# Patient Record
Sex: Female | Born: 1955 | Race: Black or African American | Hispanic: No | Smoking: Never smoker
Health system: Southern US, Community
[De-identification: ages and names within clinical notes are randomized; demographics above are authoritative.]

## PROBLEM LIST (undated history)

## (undated) DIAGNOSIS — E119 Type 2 diabetes mellitus without complications: Secondary | ICD-10-CM

## (undated) DIAGNOSIS — F32A Depression, unspecified: Secondary | ICD-10-CM

## (undated) DIAGNOSIS — L03211 Cellulitis of face: Secondary | ICD-10-CM

## (undated) DIAGNOSIS — I10 Essential (primary) hypertension: Secondary | ICD-10-CM

## (undated) DIAGNOSIS — G8929 Other chronic pain: Secondary | ICD-10-CM

## (undated) DIAGNOSIS — E785 Hyperlipidemia, unspecified: Secondary | ICD-10-CM

## (undated) DIAGNOSIS — F329 Major depressive disorder, single episode, unspecified: Secondary | ICD-10-CM

## (undated) DIAGNOSIS — M545 Low back pain, unspecified: Secondary | ICD-10-CM

## (undated) HISTORY — PX: MYOMECTOMY: SHX85

## (undated) HISTORY — DX: Depression, unspecified: F32.A

## (undated) HISTORY — PX: INGUINAL HERNIA REPAIR: SUR1180

## (undated) HISTORY — PX: EYE SURGERY: SHX253

## (undated) HISTORY — PX: CATARACT EXTRACTION W/ INTRAOCULAR LENS  IMPLANT, BILATERAL: SHX1307

## (undated) HISTORY — PX: OTHER SURGICAL HISTORY: SHX169

## (undated) HISTORY — PX: ROBOTIC ASSISTED LAPAROSCOPIC VAGINAL HYSTERECTOMY WITH FIBROID REMOVAL: SHX5387

## (undated) HISTORY — DX: Major depressive disorder, single episode, unspecified: F32.9

## (undated) HISTORY — DX: Essential (primary) hypertension: I10

---

## 2016-11-21 ENCOUNTER — Ambulatory Visit: Payer: Medicare Other | Attending: Physical Medicine and Rehabilitation | Admitting: Physical Therapy

## 2016-11-21 ENCOUNTER — Encounter: Payer: Self-pay | Admitting: Physical Therapy

## 2016-11-21 DIAGNOSIS — M79604 Pain in right leg: Secondary | ICD-10-CM | POA: Diagnosis present

## 2016-11-21 DIAGNOSIS — M25551 Pain in right hip: Secondary | ICD-10-CM | POA: Diagnosis present

## 2016-11-21 DIAGNOSIS — M545 Low back pain: Secondary | ICD-10-CM | POA: Diagnosis present

## 2016-11-21 DIAGNOSIS — R262 Difficulty in walking, not elsewhere classified: Secondary | ICD-10-CM | POA: Insufficient documentation

## 2016-11-21 DIAGNOSIS — G8929 Other chronic pain: Secondary | ICD-10-CM

## 2016-11-21 NOTE — Therapy (Signed)
[facility] Outpatient Rehabilitation [facility] [address] [place], [place], [address] Phone: [phone]   Fax:  [phone]  Physical Therapy Evaluation  Patient Details  Name: Lauren Waller MRN: [id number] Date of Birth: 01-03-1956 Referring Provider: Romero Belling, MD (pt reports Lunette Stands, MD)  Encounter Date: 11/21/2016      PT End of Session - 11/21/16 1322    Visit Number 1   Number of Visits 9   Date for PT Re-Evaluation 12/23/16   Authorization Type UHC MCR- KX at visit 15   PT Start Time 1330   PT Stop Time 1415   PT Time Calculation (min) 45 min   Activity Tolerance Patient limited by pain   Behavior During Therapy [facility] for tasks assessed/performed      No past medical history on file.  Past Surgical History:  Procedure Laterality Date  . EYE SURGERY Bilateral    2016  . fatty tissue removal from upper back     1998  . HERNIA REPAIR    . ROBOTIC ASSISTED LAPAROSCOPIC VAGINAL HYSTERECTOMY WITH FIBROID REMOVAL     2009    There were no vitals filed for this visit.       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 Has been to PT in [place] which was helpful. Did not do many of the exercises after due to difficulty, tries to walk often. "I cannot do much because I will have a problem with my (R) knee"  Walks when the weather is nice. Back bothers her mostly in cool weather. Wants MD to give her somebody to clean her home due to pain/difficulty. Does not do much during the day, just tries to walk around   How long can you sit comfortably? unable to sit for full meal   How long can you walk comfortably? take a couple steps then rest.    Patient Stated Goals decrease pain, dance, dressing, "stick" pain when standing up.    Currently in Pain? Yes   Pain Score 5    Pain Location Back   Pain Orientation Right;Lower   Pain Radiating Towards R hip and leg.    Aggravating Factors  unknown   Pain Relieving Factors medications, "I can't do exercises"   stand and try to move around            [facility] PT Assessment - 11/21/16 0001      Assessment   Medical Diagnosis lumbar   Referring Provider Romero Belling, MD  pt reports Lunette Stands, MD   Hand Dominance Right   Next MD Visit unknown   Prior Therapy yes, not this year     Precautions   Precaution Comments vision impairment     Restrictions   Weight Bearing Restrictions No     Balance Screen   Has the patient fallen in the past 6 months No     Home Environment   Living Environment Private residence  apartment   Additional Comments elevator available at apartment building, lives on 2nd floor     Prior Function   Level of Independence Independent     Cognition   Overall Cognitive Status Within Functional Limits for tasks assessed     Observation/Other Assessments   Focus on Therapeutic Outcomes (FOTO)  33% ability  goal 49% ability  Sensation   Additional Comments radicular pain, lateral/ant thigh to toes     Posture/Postural Control   Posture Comments resting flexed posture     ROM / Strength   AROM / PROM / Strength AROM;Strength     AROM   Overall AROM Comments severe limitations in hips and lumbar spine due to pain     Strength   Overall Strength Comments unable to test at eval due to pain     Palpation   Palpation comment TTP R piriformis and superior hip, unable to tolerate light touch.                    [facility] Adult PT Treatment/Exercise - 11/21/16 0001      Therapeutic Activites    Therapeutic Activities Other Therapeutic Activities   Other Therapeutic Activities bed mobility     Exercises   Exercises Lumbar     Lumbar Exercises: Stretches   Single Knee to Chest Stretch Limitations L knee to chest with R bent; R knee to chest with L extended   Lower Trunk Rotation  Limitations 5x each side, cues for slow movmement                PT Education - 11/21/16 1322    Education provided Yes   Education Details anatomy of condition, POC, HEP, exercise form/rationale   Person(s) Educated Patient   Methods Explanation;Demonstration;Tactile cues;Verbal cues;Handout   Comprehension Verbalized understanding;Returned demonstration;Verbal cues required;Tactile cues required;Need further instruction          PT Short Term Goals - 11/21/16 2006      PT SHORT TERM GOAL #1   Title Pt will be able to sit through a full meal without needing to stand due to back pain by 3/2   Baseline unable to sit through a full meal at eval   Time 4   Period Weeks   Status New     PT SHORT TERM GOAL #2   Title Pt will demonstrate proper bed and chair mobility to decrease excessive strain on spine in sit<>stand and sit<>supine   Baseline began educating at eval   Time 4   Period Weeks   Status New     PT SHORT TERM GOAL #3   Title Pt will demonstrate proper squat form to grab objects from the floor   Baseline lacking knowledge at eval   Time 4   Period Weeks   Status New     PT SHORT TERM GOAL #4   Title Pt will be able to walk continuously for 5 minutes without requiring a seated rest break to improve functional ambulation mobility   Baseline able to take a few steps at eval   Time 4   Period Weeks   Status New                  Plan - 11/21/16 1958    Clinical Impression Statement Pt presents to PT with complaints of R low back, buttock and leg pain that has worsened since injury in 2002. Pt reports past [place] in [place] helped but she was unable to continue exercises due to pain. Pt did not tolerate touch to low back or hip, reaching back to pull PT hand away during palpation. Pt reported severe pain with knee to chest and was explained that stretching sensations are not comfortable but are okay. Pt with notable tightness in lumbar and hip musculature  resulting in limited mobility and pain. Pt will  benefit from skilled PT in order to decrease muscular tightness and decrease pain with functional mobility.    Rehab Potential Fair   PT Frequency 2x / week   PT Duration 4 weeks   PT Treatment/Interventions ADLs/Self Care Home Management;Cryotherapy;Electrical Stimulation;Iontophoresis 4mg /ml Dexamethasone;Functional mobility training;Stair training;Gait training;Ultrasound;Traction;Moist Heat;Therapeutic activities;Therapeutic exercise;Balance training;Neuromuscular re-education;Patient/family education;Passive range of motion;Manual techniques;Dry needling;Taping   PT Next Visit Plan nu step, hip stretches, abdominal engagement   PT Home Exercise Plan single knee to chest, lower trunk rotation   Consulted and Agree with Plan of Care Patient      Patient will benefit from skilled therapeutic intervention in order to improve the following deficits and impairments:  Decreased range of motion, Difficulty walking, Increased muscle spasms, Decreased activity tolerance, Pain, Improper body mechanics, Impaired flexibility, Decreased mobility, Postural dysfunction  Visit Diagnosis: Chronic right-sided low back pain, with sciatica presence unspecified - Plan: PT plan of care cert/re-cert  Pain in right hip - Plan: PT plan of care cert/re-cert  Pain in right leg - Plan: PT plan of care cert/re-cert  Difficulty in walking, not elsewhere classified - Plan: PT plan of care cert/re-cert      G-Codes - 2016-12-20 2010-03-30    Functional Assessment Tool Used FOTO 33% ability (goal 49% ability), clinical judgement   Functional Limitation Mobility: Walking and moving around   Mobility: Walking and Moving Around Current Status [phone]) At least 60 percent but less than 80 percent impaired, limited or restricted   Mobility: Walking and Moving Around Goal Status [phone]) At least 40 percent but less than 60 percent impaired, limited or restricted       Problem  List There are no active problems to display for this patient.   Kordell Jafri C. Gunnison Chahal PT, DPT Dec 20, 2016 8:16 PM   [facility] [address] [place], [place], [address] Phone: [phone]   Fax:  [phone]  Name: Kati Riggenbach MRN: [id number] Date of Birth: 1956/05/16

## 2016-11-24 DIAGNOSIS — E1165 Type 2 diabetes mellitus with hyperglycemia: Secondary | ICD-10-CM | POA: Diagnosis not present

## 2016-11-24 DIAGNOSIS — E785 Hyperlipidemia, unspecified: Secondary | ICD-10-CM | POA: Diagnosis not present

## 2016-11-24 DIAGNOSIS — I10 Essential (primary) hypertension: Secondary | ICD-10-CM | POA: Diagnosis not present

## 2016-11-24 DIAGNOSIS — Z0000 Encounter for general adult medical examination without abnormal findings: Secondary | ICD-10-CM | POA: Diagnosis not present

## 2016-11-28 ENCOUNTER — Ambulatory Visit: Payer: Medicare Other | Admitting: Physical Therapy

## 2016-11-29 ENCOUNTER — Ambulatory Visit: Payer: Self-pay | Admitting: Podiatry

## 2016-11-30 ENCOUNTER — Ambulatory Visit: Payer: Medicare Other | Admitting: Physical Therapy

## 2016-12-05 ENCOUNTER — Ambulatory Visit: Payer: Medicare Other | Attending: Physical Medicine and Rehabilitation | Admitting: Physical Therapy

## 2016-12-05 DIAGNOSIS — M25551 Pain in right hip: Secondary | ICD-10-CM

## 2016-12-05 DIAGNOSIS — M545 Low back pain: Secondary | ICD-10-CM | POA: Insufficient documentation

## 2016-12-05 DIAGNOSIS — M79604 Pain in right leg: Secondary | ICD-10-CM | POA: Insufficient documentation

## 2016-12-05 DIAGNOSIS — G8929 Other chronic pain: Secondary | ICD-10-CM | POA: Diagnosis not present

## 2016-12-05 DIAGNOSIS — R262 Difficulty in walking, not elsewhere classified: Secondary | ICD-10-CM | POA: Diagnosis not present

## 2016-12-05 NOTE — Patient Instructions (Addendum)
Abdominal Bracing With Pelvic Floor (Hook-Lying)    With neutral spine, tighten pelvic floor and abdominals. Repeat 5_ times. Do _1-2__ times a day. May do with legs on pillow.   Copyright  VHI. All rights reserved.  Hamstring Stretch    With other leg bent, foot flat, grasp right leg and slowly try to straighten knee. Hold __10-30__ seconds. Repeat __3_ times. Do _1___ sessions per day.  May do on the bed.  http://gt2.exer.us/280   Copyright  VHI. All rights reserved.  Quadratus lumborum info and stretches.  On side and standing 1-3 X each, 10 -30 seconds each Issued from exercise drawer.

## 2016-12-05 NOTE — Therapy (Signed)
[facility] Outpatient Rehabilitation [facility] [address] [place], [place], [address] Phone: [phone]   Fax:  [phone]  Physical Therapy Treatment  Patient Details  Name: Lauren Waller MRN: [id number] Date of Birth: 02-Feb-1956 Referring Provider: Romero Belling, MD (pt reports Lunette Stands, MD)  Encounter Date: 12/05/2016      PT End of Session - 12/05/16 1411    Visit Number 2   Number of Visits 9   Date for PT Re-Evaluation 12/23/16   PT Start Time 1330   PT Stop Time 1410   PT Time Calculation (min) 40 min   Activity Tolerance Patient tolerated treatment well   Behavior During Therapy [facility] for tasks assessed/performed      No past medical history on file.  Past Surgical History:  Procedure Laterality Date  . EYE SURGERY Bilateral    2016  . fatty tissue removal from upper back     1998  . HERNIA REPAIR    . ROBOTIC ASSISTED LAPAROSCOPIC VAGINAL HYSTERECTOMY WITH FIBROID REMOVAL     2009    There were no vitals filed for this visit.      Subjective Assessment - 12/05/16 1329    Subjective 9-10/10.  I tried to dot the exercises   Currently in Pain? Yes   Pain Score 8    Pain Location Back   Pain Orientation Right;Lower   Pain Radiating Towards right hip and leg   Aggravating Factors  sitting too long   Pain Relieving Factors meds and moving a little                         [facility] Adult PT Treatment/Exercise - 12/05/16 0001      Lumbar Exercises: Stretches   Active Hamstring Stretch 3 reps;20 seconds   Single Knee to Chest Stretch 5 reps  5 seconds   Lower Trunk Rotation 5 reps   Lower Trunk Rotation Limitations minor cues   Pelvic Tilt 5 reps   Pelvic Tilt Limitations HEP, cues  with legs straight   Standing Side Bend Limitations Doorway stretches and side bends stretching the right side only, added to HEP multiple reps in clinic do to feeling good.      Lumbar Exercises: Sidelying   Clam 10 reps  right side    Other Sidelying Lumbar Exercises Quadratus stretch over pillow and arm overhead,  pillow between knees to protect knee                PT Education - 12/05/16 1411    Education provided Yes   Education Details HEP   Person(s) Educated Patient   Methods Explanation;Demonstration;Tactile cues;Verbal cues;Handout   Comprehension Returned demonstration;Verbalized understanding          PT Short Term Goals - 11/21/16 2006      PT SHORT TERM GOAL #1   Title Pt will be able to sit through a full meal without needing to stand due to back pain by 3/2   Baseline unable to sit through a full meal at eval   Time 4   Period Weeks   Status New     PT SHORT TERM GOAL #2   Title Pt will demonstrate proper bed and chair mobility to decrease excessive strain on spine in sit<>stand and sit<>supine   Baseline began educating at eval   Time 4   Period Weeks   Status New     PT SHORT TERM GOAL #3   Title  Pt will demonstrate proper squat form to grab objects from the floor   Baseline lacking knowledge at eval   Time 4   Period Weeks   Status New     PT SHORT TERM GOAL #4   Title Pt will be able to walk continuously for 5 minutes without requiring a seated rest break to improve functional ambulation mobility   Baseline able to take a few steps at eval   Time 4   Period Weeks   Status New                  Plan - 12/05/16 1411    Clinical Impression Statement less pain at end of session.  No pain number given.  progress twward HEP goals.  Knee pain right interferes with some back exercises, so care taken to avoid increased knee pain.   No new objective findings.  Minimal cues required for ZHEP.   PT Next Visit Plan nu step, hip stretches, abdominal engagement,  review HEP   PT Home Exercise Plan single knee to chest, lower trunk rotation,  Quadratus lumborum stretches. ,  pelvic stretch,  hamstring stretch   Consulted and Agree with Plan of Care Patient      Patient  will benefit from skilled therapeutic intervention in order to improve the following deficits and impairments:  Decreased range of motion, Difficulty walking, Increased muscle spasms, Decreased activity tolerance, Pain, Improper body mechanics, Impaired flexibility, Decreased mobility, Postural dysfunction  Visit Diagnosis: Chronic right-sided low back pain, with sciatica presence unspecified  Pain in right hip  Pain in right leg  Difficulty in walking, not elsewhere classified     Problem List There are no active problems to display for this patient.   Nicholaos Schippers PTA 12/05/2016, 2:14 PM  [facility] [address] [place], [place], [address] Phone: [phone]   Fax:  [phone]  Name: Lauren Waller MRN: [id number] Date of Birth: [birth year]

## 2016-12-08 ENCOUNTER — Ambulatory Visit: Payer: Medicare Other | Admitting: Physical Therapy

## 2016-12-12 ENCOUNTER — Ambulatory Visit: Payer: Medicare Other | Admitting: Physical Therapy

## 2016-12-14 ENCOUNTER — Encounter: Payer: Self-pay | Admitting: Physical Therapy

## 2016-12-14 ENCOUNTER — Ambulatory Visit: Payer: Medicare Other | Admitting: Physical Therapy

## 2016-12-14 DIAGNOSIS — M545 Low back pain: Secondary | ICD-10-CM | POA: Diagnosis not present

## 2016-12-14 DIAGNOSIS — M25551 Pain in right hip: Secondary | ICD-10-CM

## 2016-12-14 DIAGNOSIS — M79604 Pain in right leg: Secondary | ICD-10-CM | POA: Diagnosis not present

## 2016-12-14 DIAGNOSIS — G8929 Other chronic pain: Secondary | ICD-10-CM | POA: Diagnosis not present

## 2016-12-14 DIAGNOSIS — R262 Difficulty in walking, not elsewhere classified: Secondary | ICD-10-CM

## 2016-12-14 NOTE — Therapy (Signed)
[facility] Outpatient Rehabilitation [facility] [address] [place], [place], [address] Phone: [phone]   Fax:  [phone]  Physical Therapy Treatment  Patient Details  Name: Lauren Waller MRN: [id number] Date of Birth: [birth year] Referring Provider: Romero Belling, MD (pt reports Lunette Stands, MD)  Encounter Date: 12/14/2016      PT End of Session - 12/14/16 1328    Visit Number 3   Number of Visits 9   Date for PT Re-Evaluation 12/23/16   Authorization Type UHC MCR- KX at visit 15   PT Start Time 1330   PT Stop Time 1429   PT Time Calculation (min) 59 min   Activity Tolerance Patient tolerated treatment well   Behavior During Therapy [facility] for tasks assessed/performed      History reviewed. No pertinent past medical history.  Past Surgical History:  Procedure Laterality Date  . EYE SURGERY Bilateral    2016  . fatty tissue removal from upper back     1998  . HERNIA REPAIR    . ROBOTIC ASSISTED LAPAROSCOPIC VAGINAL HYSTERECTOMY WITH FIBROID REMOVAL     2009    There were no vitals filed for this visit.      Subjective Assessment - 12/14/16 1331    Subjective Reports back is "getting there". When asked to sit upright, pt reported "i'm tired, that's the way I sit"  Reports HEP is going okay. Notices neck discomfort. Denies pain/difficulty with dressing, "I just take my time"    How long can you sit comfortably? unable to sit for full meal- gets up once   How long can you walk comfortably? 20 min   Patient Stated Goals decrease pain, dance, dressing, "stick" pain when standing up.    Currently in Pain? Yes   Pain Score 3    Pain Location Back   Pain Orientation Right;Lower   Pain Descriptors / Indicators --  stiffness   Aggravating Factors  anything, all the time, "I don't do many activities"   Pain Relieving Factors stretches            [facility] PT Assessment - 12/14/16 0001      AROM   Overall AROM Comments able to bend to reach object  on floor     Strength   Overall Strength Comments able to stand from chair without hands, squat down for object                     [facility] Adult PT Treatment/Exercise - 12/14/16 0001      Lumbar Exercises: Stretches   Single Knee to Chest Stretch Limitations L knee to chest with R bent; R knee to chest with L extended   Piriformis Stretch Limitations both, towel to hold foot up     Lumbar Exercises: Aerobic   Stationary Bike nu step 10 min L5     Lumbar Exercises: Standing   Row 20 reps;Theraband   Theraband Level (Row) Level 2 (Red)     Lumbar Exercises: Supine   Large Ball Abdominal Isometric 20 reps   Large Ball Abdominal Isometric Limitations in hooklying, red physioball   Large Ball Oblique Isometric 20 reps  both arms   Large Ball Oblique Isometric Limitations in hooklying red physioball   Other Supine Lumbar Exercises iso hamstring curl over physioball     Modalities   Modalities Moist Heat     Moist Heat Therapy   Number Minutes Moist Heat 10 Minutes   Moist Heat Location Lumbar  Spine                PT Education - 12/14/16 1347    Education provided Yes   Education Details exercise form/rationale, progress toward goals, POC, importance of posture, stiffness when still, joining a gym/discussed cardio equipment use   Person(s) Educated Patient   Methods Explanation;Demonstration;Tactile cues;Verbal cues   Comprehension Verbalized understanding;Returned demonstration;Verbal cues required;Tactile cues required;Need further instruction          PT Short Term Goals - 12/14/16 1335      PT SHORT TERM GOAL #1   Title Pt will be able to sit through a full meal without needing to stand due to back pain by 3/2   Baseline gets up once at a meal due to stiffness   Status On-going     PT SHORT TERM GOAL #2   Title Pt will demonstrate proper bed and chair mobility to decrease excessive strain on spine in sit<>stand and sit<>supine   Baseline good  form and denied pain with movements   Status Achieved     PT SHORT TERM GOAL #3   Title Pt will demonstrate proper squat form to grab objects from the floor   Baseline able with significant difficulty   Status On-going     PT SHORT TERM GOAL #4   Title Pt will be able to walk continuously for 5 minutes without requiring a seated rest break to improve functional ambulation mobility   Baseline able to walk 20 minutes reported today.    Status Achieved                  Plan - 12/14/16 1349    Clinical Impression Statement Pt has made significant progress toward goals and will be d/c to independent program next week. Discussed importance of posture and frequent activity to avoid sitting still which causes stiffness. Unable to reliably test MMT.    PT Next Visit Plan d/c 3/1   PT Home Exercise Plan single knee to chest, lower trunk rotation,  Quadratus lumborum stretches. ,  pelvic stretch,  hamstring stretch   Consulted and Agree with Plan of Care Patient      Patient will benefit from skilled therapeutic intervention in order to improve the following deficits and impairments:     Visit Diagnosis: Chronic right-sided low back pain, with sciatica presence unspecified  Pain in right hip  Pain in right leg  Difficulty in walking, not elsewhere classified       G-Codes - 12/14/16 2139    Functional Assessment Tool Used (Outpatient Only) FOTO 48% ability (goal 49%), clinical judgement, progress toward goals   Functional Limitation Mobility: Walking and moving around   Mobility: Walking and Moving Around Current Status [phone]([id number]) At least 60 percent but less than 80 percent impaired, limited or restricted   Mobility: Walking and Moving Around Goal Status [phone](G8979) At least 40 percent but less than 60 percent impaired, limited or restricted      Problem List There are no active problems to display for this patient.   Jasira Robinson C. Merik Mignano PT, DPT 12/14/16 9:41 PM   [facility] [address] [place], [place], [address] Phone: [phone]   Fax:  [phone]  Name: Crecencio Mcileen Joslyn MRN: [id number] Date of Birth: 1956/03/25

## 2016-12-19 ENCOUNTER — Ambulatory Visit: Payer: Medicare Other | Admitting: Physical Therapy

## 2016-12-22 ENCOUNTER — Ambulatory Visit: Payer: Medicare Other | Attending: Physical Medicine and Rehabilitation | Admitting: Physical Therapy

## 2016-12-22 ENCOUNTER — Encounter: Payer: Self-pay | Admitting: Physical Therapy

## 2016-12-22 DIAGNOSIS — M545 Low back pain: Secondary | ICD-10-CM | POA: Diagnosis not present

## 2016-12-22 DIAGNOSIS — R262 Difficulty in walking, not elsewhere classified: Secondary | ICD-10-CM | POA: Diagnosis not present

## 2016-12-22 DIAGNOSIS — M25551 Pain in right hip: Secondary | ICD-10-CM | POA: Insufficient documentation

## 2016-12-22 DIAGNOSIS — M79604 Pain in right leg: Secondary | ICD-10-CM | POA: Diagnosis not present

## 2016-12-22 DIAGNOSIS — G8929 Other chronic pain: Secondary | ICD-10-CM | POA: Diagnosis not present

## 2016-12-22 NOTE — Therapy (Signed)
[place], [place], [address] Phone: [phone]   Fax:  [phone]  Physical Therapy Treatment/Discharge summary  Patient Details  Name: Lauren Waller MRN: [id number] Date of Birth: [birth year] Referring Provider: Laroy Apple, MD (pt reports Almedia Balls, MD)  Encounter Date: 12/22/2016      PT End of Session - 12/22/16 1330    Visit Number 4   Number of Visits 9   Date for PT Re-Evaluation 12/23/16   Authorization Type [employer] MCR- KX at visit 15   PT Start Time 1330   PT Stop Time 1415   PT Time Calculation (min) 45 min   Activity Tolerance Patient tolerated treatment well   Behavior During Therapy [facility] for tasks assessed/performed      History reviewed. No pertinent past medical history.  Past Surgical History:  Procedure Laterality Date  . EYE SURGERY Bilateral    2016  . fatty tissue removal from upper back     1998  . HERNIA REPAIR    . ROBOTIC ASSISTED LAPAROSCOPIC VAGINAL HYSTERECTOMY WITH FIBROID REMOVAL     2009    There were no vitals filed for this visit.      Subjective Assessment - 12/22/16 1330    Subjective pt reports she is feeling much better.    Patient Stated Goals decrease pain, dance, dressing, "stick" pain when standing up.             [facility] PT Assessment - 12/22/16 0001      Posture/Postural Control   Posture Comments upright, neutral posture.     Palpation   Palpation comment denies TTP                     [facility] Adult PT Treatment/Exercise - 12/22/16 0001      Lumbar Exercises: Stretches   Piriformis Stretch Limitations slant board gastroc stretch     Lumbar Exercises: Aerobic   Stationary Bike nu step 7 min L5   UBE (Upper Arm Bike) 2 min L1     Lumbar Exercises: Machines for Strengthening   Cybex Knee Extension 20lb   Cybex Knee Flexion 20lb   Leg Press no added weight to sled   Other Lumbar Machine Exercise row machine, lat pull  down     Lumbar Exercises: Standing   Heel Raises 15 reps   Functional Squats Limitations at counter                PT Education - 12/22/16 1439    Education provided Yes   Education Details exercise form/rationale, HEP, gym program   Person(s) Educated Patient   Methods Explanation;Demonstration;Tactile cues;Verbal cues;Handout   Comprehension Verbalized understanding;Returned demonstration;Verbal cues required;Tactile cues required          PT Short Term Goals - 12/22/16 1337      PT SHORT TERM GOAL #1   Title Pt will be able to sit through a full meal without needing to stand due to back pain by 3/2   Baseline reports she is able to sit through meals   Status Achieved     PT SHORT TERM GOAL #2   Title Pt will demonstrate proper bed and chair mobility to decrease excessive strain on spine in sit<>stand and sit<>supine   Baseline good form and denied pain with movements   Status Achieved     PT SHORT TERM GOAL #3   Title Pt will demonstrate proper squat form to grab objects from  the floor   Baseline able without reports of pain, some difficulty   Status Achieved     PT SHORT TERM GOAL #4   Title Pt will be able to walk continuously for 5 minutes without requiring a seated rest break to improve functional ambulation mobility   Baseline able to walk 20 minutes reported today.    Status Achieved                  Plan - Jan 06, 2017 1440    Clinical Impression Statement Pt denies back pain when discussing with PT and when doing exercises but answered FOTO questions in a way that she was very disabled. Pt has met all goals and will be d/c to independent gym program for further strengthening. I provided the pt with a handout to give to a trainer at the gym so they can show her how to use the appropriate equipment. Pt was instructed to contact us with any further questions.    Consulted and Agree with Plan of Care Patient      Patient will benefit from skilled  therapeutic intervention in order to improve the following deficits and impairments:     Visit Diagnosis: Chronic right-sided low back pain, with sciatica presence unspecified  Pain in right hip  Pain in right leg  Difficulty in walking, not elsewhere classified       G-Codes - 06-Jan-2017 1442    Functional Assessment Tool Used (Outpatient Only) FOTO 48% ability (goal 49%), clinical judgement, progress toward goals   Functional Limitation Mobility: Walking and moving around   Mobility: Walking and Moving Around Goal Status [phone]) At least 40 percent but less than 60 percent impaired, limited or restricted   Mobility: Walking and Moving Around Discharge Status [phone]) At least 40 percent but less than 60 percent impaired, limited or restricted      Problem List There are no active problems to display for this patient. PHYSICAL THERAPY DISCHARGE SUMMARY  Visits from Start of Care: 4  Current functional level related to goals / functional outcomes: See above   Remaining deficits: See above   Education / Equipment: Anatomy of condition, POC, HEP, exercise form/rationale  Plan: Patient agrees to discharge.  Patient goals were met. Patient is being discharged due to meeting the stated rehab goals.  ?????      Tye Juarez C. Asaiah Hunnicutt PT, DPT January 06, 2017 2:43 PM   [facility] [address] [place], [place], [address] Phone: [phone]   Fax:  [phone]  Name: Lauren Waller MRN: [id number] Date of Birth: 22-Jun-1956

## 2016-12-28 ENCOUNTER — Encounter: Payer: Self-pay | Admitting: Podiatry

## 2016-12-28 ENCOUNTER — Ambulatory Visit (INDEPENDENT_AMBULATORY_CARE_PROVIDER_SITE_OTHER): Payer: Medicare Other | Admitting: Podiatry

## 2016-12-28 VITALS — BP 178/87 | HR 90 | Ht 64.0 in | Wt 172.0 lb

## 2016-12-28 DIAGNOSIS — B351 Tinea unguium: Secondary | ICD-10-CM

## 2016-12-28 DIAGNOSIS — E1151 Type 2 diabetes mellitus with diabetic peripheral angiopathy without gangrene: Secondary | ICD-10-CM | POA: Diagnosis not present

## 2016-12-28 NOTE — Progress Notes (Signed)
   Subjective:    Patient ID: Lauren Waller, female    DOB: 06-02-1956, 61 y.o.   MRN: [id number]  HPI    This patient presents today complaining that her toenails are long and request debridement of the toenails. Patient states that she has visited a podiatrist in the past year estimated her last visit approximately 3 months prior Patient says she is a diabetic approximately 8 years Denies history of foot ulceration, claudication or amputation Denies smoking history  Review of Systems  Constitutional: Positive for fever.  Eyes: Positive for visual disturbance.  Endocrine: Positive for cold intolerance and heat intolerance.  Genitourinary: Positive for frequency.  Musculoskeletal: Positive for back pain.  Skin:       Change in nails  All other systems reviewed and are negative.      Objective:   Physical Exam  Patient is approximated 5 foot 4 inches and weighs approximately 172 pounds  Orientated 3  Vascular: DP pulses 2/4 bilaterally PT pulses 1/4 bilaterally Capillary reflex immediate bilaterally No peripheral edema bilaterally  Neurological: Sensation to 10 g monofilament wire intact 4/5 bilaterally Vibratory sensation reactive bilaterally Ankle reflexes reactive bilaterally  Dermatological: No open skin lesions bilaterally The toenails are elongated with occasional texture and color changes 6-10  Musculoskeletal: Varus rotated fourth toes bilaterally Manual motor testing dorsi flexion, plantar flexion 5/5 bilaterally Patient walks with assistance of cane (back pain)       Assessment & Plan:   Assessment: Diabetic with decreased posterior tibial pulses suggestive of peripheral arterial disease Protective sensation intact Mycotic toenails 6-10  Plan: Debridement of toenails 6-10 mechanically and electrically without any bleeding  Reappoint 3 months

## 2016-12-28 NOTE — Patient Instructions (Signed)

## 2017-02-02 DIAGNOSIS — M79642 Pain in left hand: Secondary | ICD-10-CM | POA: Diagnosis not present

## 2017-02-02 DIAGNOSIS — M47816 Spondylosis without myelopathy or radiculopathy, lumbar region: Secondary | ICD-10-CM | POA: Diagnosis not present

## 2017-02-06 DIAGNOSIS — H52223 Regular astigmatism, bilateral: Secondary | ICD-10-CM | POA: Diagnosis not present

## 2017-02-07 ENCOUNTER — Ambulatory Visit: Payer: Medicare Other | Admitting: Physical Therapy

## 2017-02-10 ENCOUNTER — Emergency Department (HOSPITAL_COMMUNITY)
Admission: EM | Admit: 2017-02-10 | Discharge: 2017-02-10 | Disposition: A | Discharge status: Home / Self Care | Payer: Medicare Other | Attending: Emergency Medicine | Admitting: Emergency Medicine

## 2017-02-10 ENCOUNTER — Encounter (HOSPITAL_COMMUNITY): Payer: Self-pay

## 2017-02-10 ENCOUNTER — Emergency Department (HOSPITAL_COMMUNITY): Payer: Medicare Other

## 2017-02-10 DIAGNOSIS — Z7984 Long term (current) use of oral hypoglycemic drugs: Secondary | ICD-10-CM | POA: Diagnosis not present

## 2017-02-10 DIAGNOSIS — M25561 Pain in right knee: Secondary | ICD-10-CM | POA: Diagnosis not present

## 2017-02-10 DIAGNOSIS — E1165 Type 2 diabetes mellitus with hyperglycemia: Secondary | ICD-10-CM | POA: Diagnosis not present

## 2017-02-10 DIAGNOSIS — R739 Hyperglycemia, unspecified: Secondary | ICD-10-CM

## 2017-02-10 DIAGNOSIS — Z79899 Other long term (current) drug therapy: Secondary | ICD-10-CM | POA: Insufficient documentation

## 2017-02-10 LAB — I-STAT CHEM 8, ED
BUN: 12 mg/dL (ref 6–20)
CHLORIDE: 99 mmol/L — AB (ref 101–111)
Calcium, Ion: 1.15 mmol/L (ref 1.15–1.40)
Creatinine, Ser: 0.5 mg/dL (ref 0.44–1.00)
Glucose, Bld: 325 mg/dL — ABNORMAL HIGH (ref 65–99)
HCT: 34 % — ABNORMAL LOW (ref 36.0–46.0)
HEMOGLOBIN: 11.6 g/dL — AB (ref 12.0–15.0)
POTASSIUM: 3.5 mmol/L (ref 3.5–5.1)
SODIUM: 135 mmol/L (ref 135–145)
TCO2: 25 mmol/L (ref 0–100)

## 2017-02-10 LAB — SEDIMENTATION RATE: Sed Rate: 115 mm/hr — ABNORMAL HIGH (ref 0–22)

## 2017-02-10 LAB — CBC WITH DIFFERENTIAL/PLATELET
Basophils Absolute: 0 10*3/uL (ref 0.0–0.1)
Basophils Relative: 0 %
EOS ABS: 0.1 10*3/uL (ref 0.0–0.7)
EOS PCT: 1 %
HCT: 33.1 % — ABNORMAL LOW (ref 36.0–46.0)
HEMOGLOBIN: 10.9 g/dL — AB (ref 12.0–15.0)
LYMPHS ABS: 2.5 10*3/uL (ref 0.7–4.0)
LYMPHS PCT: 23 %
MCH: 27.6 pg (ref 26.0–34.0)
MCHC: 32.9 g/dL (ref 30.0–36.0)
MCV: 83.8 fL (ref 78.0–100.0)
MONOS PCT: 5 %
Monocytes Absolute: 0.6 10*3/uL (ref 0.1–1.0)
NEUTROS PCT: 71 %
Neutro Abs: 7.9 10*3/uL — ABNORMAL HIGH (ref 1.7–7.7)
Platelets: 294 10*3/uL (ref 150–400)
RBC: 3.95 MIL/uL (ref 3.87–5.11)
RDW: 12.3 % (ref 11.5–15.5)
WBC: 11.1 10*3/uL — ABNORMAL HIGH (ref 4.0–10.5)

## 2017-02-10 LAB — C-REACTIVE PROTEIN: CRP: 2.9 mg/dL — ABNORMAL HIGH (ref <1.0)

## 2017-02-10 MED ORDER — IBUPROFEN 600 MG PO TABS: 600.0000 mg | ORAL_TABLET | Freq: Four times a day (QID) | ORAL | 0 refills | Status: DC | PRN

## 2017-02-10 MED ORDER — COLCHICINE 0.6 MG PO TABS: ORAL_TABLET | ORAL | 0 refills | Status: DC

## 2017-02-10 MED ORDER — IBUPROFEN 400 MG PO TABS
600.0000 mg | ORAL_TABLET | Freq: Once | ORAL | Status: AC
  Administered 2017-02-10: 09:00:00 600 mg via ORAL
  Filled 2017-02-10: qty 1

## 2017-02-10 MED ORDER — GLIMEPIRIDE 4 MG PO TABS
4.0000 mg | ORAL_TABLET | Freq: Every day | ORAL | Status: DC
  Administered 2017-02-10: 4 mg via ORAL
  Filled 2017-02-10: qty 1

## 2017-02-10 MED ORDER — OXYCODONE-ACETAMINOPHEN 5-325 MG PO TABS: 1.0000 | ORAL_TABLET | ORAL | 0 refills | Status: DC | PRN

## 2017-02-10 MED ORDER — OXYCODONE-ACETAMINOPHEN 5-325 MG PO TABS
1.0000 | ORAL_TABLET | Freq: Once | ORAL | Status: AC
  Administered 2017-02-10: 1 via ORAL
  Filled 2017-02-10: qty 1

## 2017-02-10 NOTE — ED Provider Notes (Signed)
[facility]-EMERGENCY DEPT Provider Note   CSN: [id number] Arrival date & time: 02/10/17  4098     History   Chief Complaint Chief Complaint  Patient presents with  . Knee Pain    HPI Lauren Waller is a 61 y.o. female.  HPI   Patient is a 61 year old female with history of diabetes, hypertension, hyperlipidemia who presents the ED with complaint of right knee pain and swelling, onset 3 days. Patient reports having gradually worsening pain and swelling to her right knee. She notes the pain is constant and worse with movement or when bearing weight. Denies any recent fall, trauma or injury. Patient states she has been taking Tylenol and ibuprofen at home without relief. 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 She notes she was seen by her PCP previously regarding her knee pain and was reported to have a ligament injury which she was suppose to have a joint infection but notes she was unable to due to her insurance. Reports hx of chronic low back pain related to slipped disc, denies any acute changes. Denies fever, chills, redness, numbness, weakness, rash.    EMS reports pt's CBG 368 en route. Pt reports running out of her DM medication a few days ago. She reports taking  Glimepiride once daily. Denies fever, cough, SOB, CP, abdominal pain, N/V/D, urinary sxs.   Past Medical History:  Diagnosis Date  . Diabetes mellitus without complication (HCC)     There are no active problems to display for this patient.   Past Surgical History:  Procedure Laterality Date  . EYE SURGERY Bilateral    2016  . fatty tissue removal from upper back     1998  . HERNIA REPAIR    . ROBOTIC ASSISTED LAPAROSCOPIC VAGINAL HYSTERECTOMY WITH FIBROID REMOVAL     2009    OB History    No data available       Home Medications    Prior to Admission medications   Medication Sig Start Date End  Date Taking? Authorizing Provider  AMLODIPINE BESYLATE PO Take by mouth.    Historical Provider, MD  colchicine 0.6 MG tablet Take two 0.6mg  tablets initially, then take 0.6mg  (one tablet) by mouth every 1-2 hours after until one of the following occurs: 1.  The pain is gone 2.  The maximum dose has been given (no more than 3 tabs in 3 hours or 10 tabs in 24 hours) 3.  The side effects outweight the benefits 02/10/17   Barrett Henle, PA-C  gabapentin (NEURONTIN) 100 MG capsule Take 100 mg by mouth 3 (three) times daily.    Historical Provider, MD  glimepiride (AMARYL) 4 MG tablet Take 4 mg by mouth daily with breakfast.    Historical Provider, MD  ibuprofen (ADVIL,MOTRIN) 600 MG tablet Take 1 tablet (600 mg total) by mouth every 6 (six) hours as needed. 02/10/17   Barrett Henle, PA-C  lisinopril-hydrochlorothiazide (PRINZIDE,ZESTORETIC) 20-12.5 MG tablet Take 1 tablet by mouth daily.    Historical Provider, MD  oxyCODONE-acetaminophen (PERCOCET/ROXICET) 5-325 MG tablet Take 1 tablet by mouth every 4 (four) hours as needed for severe pain. 02/10/17   Barrett Henle, PA-C    Family History History reviewed. No pertinent family history.  Social History Social History  Substance Use Topics  . Smoking status: Never Smoker  . Smokeless tobacco: Never Used  .  Alcohol use Not on file     Allergies   Januvia [sitagliptin] and Metformin and related   Review of Systems Review of Systems  Musculoskeletal: Positive for arthralgias (right knee) and joint swelling.  All other systems reviewed and are negative.    Physical Exam Updated Vital Signs BP (!) 156/73   Pulse 96   Temp 98.4 F (36.9 C)   Resp 18   SpO2 99%   Physical Exam  Constitutional: She is oriented to person, place, and time. She appears well-developed and well-nourished. No distress.  HENT:  Head: Normocephalic and atraumatic.  Eyes: Conjunctivae and EOM are normal. Right eye exhibits no  discharge. Left eye exhibits no discharge. No scleral icterus.  Neck: Normal range of motion. Neck supple.  Cardiovascular: Normal rate, regular rhythm, normal heart sounds and intact distal pulses.   Pulmonary/Chest: Effort normal and breath sounds normal. No respiratory distress. She has no wheezes. She has no rales. She exhibits no tenderness.  Abdominal: Soft. Bowel sounds are normal. She exhibits no distension and no mass. There is no tenderness. There is no rebound and no guarding. No hernia.  Musculoskeletal: She exhibits tenderness. She exhibits no edema or deformity.       Right hip: Normal.       Right knee: She exhibits decreased range of motion (due to pain and swelling) and swelling. She exhibits no effusion, no ecchymosis, no deformity, no laceration, no erythema, normal alignment and normal patellar mobility. Tenderness (diffuse) found.       Right ankle: Normal.       Right lower leg: Normal.       Right foot: Normal.  Right knee exam limited due to pain. Pt only able to flex/extend knee appx. 10-15 degrees. Moderate swelling with diffuse TTP. No erythema or warmth present. No swelling or tenderness of calf. FROM of right hip, ankle and foot. Sensation intact. 2+ DP pulse.   Neurological: She is alert and oriented to person, place, and time.  Skin: Skin is warm and dry. She is not diaphoretic.  Nursing note and vitals reviewed.    ED Treatments / Results  Labs (all labs ordered are listed, but only abnormal results are displayed) Labs Reviewed  C-REACTIVE PROTEIN - Abnormal; Notable for the following:       Result Value   CRP 2.9 (*)    All other components within normal limits  CBC WITH DIFFERENTIAL/PLATELET - Abnormal; Notable for the following:    WBC 11.1 (*)    Hemoglobin 10.9 (*)    HCT 33.1 (*)    Neutro Abs 7.9 (*)    All other components within normal limits  SEDIMENTATION RATE - Abnormal; Notable for the following:    Sed Rate 115 (*)    All other  components within normal limits  I-STAT CHEM 8, ED - Abnormal; Notable for the following:    Chloride 99 (*)    Glucose, Bld 325 (*)    Hemoglobin 11.6 (*)    HCT 34.0 (*)    All other components within normal limits    EKG  EKG Interpretation None       Radiology Dg Knee Complete 4 Views Right  Result Date: 02/10/2017 CLINICAL DATA:  Right knee pain/swelling EXAM: RIGHT KNEE - COMPLETE 4+ VIEW COMPARISON:  None. FINDINGS: No fracture or dislocation is seen. Moderate tricompartmental degenerative changes, most prominent in the patellofemoral compartment. The visualized soft tissues are unremarkable. No suprapatellar knee joint effusion. IMPRESSION: No fracture  or dislocation is seen. Moderate degenerative changes. Electronically Signed   By: Charline Bills M.D.   On: 02/10/2017 07:28    Procedures Procedures (including critical care time)  Medications Ordered in ED Medications  glimepiride (AMARYL) tablet 4 mg (4 mg Oral Given 02/10/17 0841)  oxyCODONE-acetaminophen (PERCOCET/ROXICET) 5-325 MG per tablet 1 tablet (1 tablet Oral Given 02/10/17 0701)  ibuprofen (ADVIL,MOTRIN) tablet 600 mg (600 mg Oral Given 02/10/17 1610)     Initial Impression / Assessment and Plan / ED Course  I have reviewed the triage vital signs and the nursing notes.  Pertinent labs & imaging results that were available during my care of the patient were reviewed by me and considered in my medical decision making (see chart for details).    Patient presents with right knee pain and swelling that has worsened over the past 3 days. Denies any recent fall, trauma or injury. Denies fever, redness, warmth, numbness, weakness. VSS. Exam revealed moderate swelling and diffuse tenderness to right knee. Patient with limited passive and active range of motion of right knee due to reported pain. Right lower extremity otherwise neurovascularly intact. Patient given pain meds. Discussed patient with Dr. Blinda Leatherwood who  also evaluated patient in the ED. Plan to order xray, CRP and sed rate for further evaluation. Right knee x-ray revealed no fracture or dislocation, moderate degenerative changes noted, and no effusion present. Mild leukocytosis, WBC 11. CRP 2.9. Sed rate 115. Suspect patient's symptoms are likely related to gout flare. Pt is able ambulate, no bony abnormality or deformity, no erythema or excessive heat, no evidence of cellulitis, DVT, or septic joint.  Plan to discharge patient home with colchicine and symptomatic treatment and outpatient follow up with PCP. Patient also given information to follow up with ortho as needed. Discussed return precautions.  Patient was noted to have elevated glucose level by EMS and route. CBG 360. Patient denies any associated symptoms. Reports running out of her home glucose medication a few days ago. Glucose 325, no anion gap. Patient given her home dose of 4 mg glimepiride in the ED. Advised patient to take her home blood pressure medications and follow up with her PCP within the next 3-4 days for follow-up evaluation regarding her blood pressure and diabetes.   Final Clinical Impressions(s) / ED Diagnoses   Final diagnoses:  Acute pain of right knee  Hyperglycemia    New Prescriptions New Prescriptions   COLCHICINE 0.6 MG TABLET    Take two 0.6mg  tablets initially, then take 0.6mg  (one tablet) by mouth every 1-2 hours after until one of the following occurs: 1.  The pain is gone 2.  The maximum dose has been given (no more than 3 tabs in 3 hours or 10 tabs in 24 hours) 3.  The side effects outweight the benefits   IBUPROFEN (ADVIL,MOTRIN) 600 MG TABLET    Take 1 tablet (600 mg total) by mouth every 6 (six) hours as needed.   OXYCODONE-ACETAMINOPHEN (PERCOCET/ROXICET) 5-325 MG TABLET    Take 1 tablet by mouth every 4 (four) hours as needed for severe pain.     Satira Sark Jobos, [place] 02/10/17 1018    Gilda Crease, MD 02/12/17 [phone]

## 2017-02-10 NOTE — ED Notes (Signed)
Patient transported to X-ray 

## 2017-02-10 NOTE — ED Notes (Signed)
Pt refuses to ambulate, due to pain returning in her knee.

## 2017-02-10 NOTE — Discharge Instructions (Signed)
Take your medication as prescribed as have her pain relief. I recommend continuing to rest, elevate and apply ice to her knee for 15-20 minutes 3-4 times daily. Follow-up with your primary care provider in the next 3-4 days for follow-up evaluation. I recommend following up with the orthopedic clinic listed below her symptoms have not improved within the next week. Please return to the Emergency Department if symptoms worsen or new onset of fever, redness, worsening swelling, warmth, numbness, weakness, calf swelling/tenderness.

## 2017-02-10 NOTE — ED Triage Notes (Addendum)
Pt here for knee pain on right knee, swelling noted, denies injury. 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 Ems reports elevated cbg 368 and pt request her DM meds changed.

## 2017-02-10 NOTE — ED Notes (Signed)
Pt ambulated with 1-staff assistance from bed to wall. Pt dressed and ready for discharge

## 2017-02-13 ENCOUNTER — Ambulatory Visit: Payer: Medicare Other | Attending: Orthopedic Surgery | Admitting: Physical Therapy

## 2017-02-13 DIAGNOSIS — E1165 Type 2 diabetes mellitus with hyperglycemia: Secondary | ICD-10-CM | POA: Diagnosis not present

## 2017-02-13 DIAGNOSIS — D649 Anemia, unspecified: Secondary | ICD-10-CM | POA: Diagnosis not present

## 2017-02-13 DIAGNOSIS — E785 Hyperlipidemia, unspecified: Secondary | ICD-10-CM | POA: Diagnosis not present

## 2017-02-13 DIAGNOSIS — E559 Vitamin D deficiency, unspecified: Secondary | ICD-10-CM | POA: Diagnosis not present

## 2017-02-13 DIAGNOSIS — I10 Essential (primary) hypertension: Secondary | ICD-10-CM | POA: Diagnosis not present

## 2017-02-20 DIAGNOSIS — I10 Essential (primary) hypertension: Secondary | ICD-10-CM | POA: Diagnosis not present

## 2017-02-20 DIAGNOSIS — D649 Anemia, unspecified: Secondary | ICD-10-CM | POA: Diagnosis not present

## 2017-02-20 DIAGNOSIS — E785 Hyperlipidemia, unspecified: Secondary | ICD-10-CM | POA: Diagnosis not present

## 2017-02-20 DIAGNOSIS — E1165 Type 2 diabetes mellitus with hyperglycemia: Secondary | ICD-10-CM | POA: Diagnosis not present

## 2017-02-23 DIAGNOSIS — M25561 Pain in right knee: Secondary | ICD-10-CM | POA: Diagnosis not present

## 2017-03-06 DIAGNOSIS — D649 Anemia, unspecified: Secondary | ICD-10-CM | POA: Diagnosis not present

## 2017-03-06 DIAGNOSIS — E785 Hyperlipidemia, unspecified: Secondary | ICD-10-CM | POA: Diagnosis not present

## 2017-03-06 DIAGNOSIS — I10 Essential (primary) hypertension: Secondary | ICD-10-CM | POA: Diagnosis not present

## 2017-03-06 DIAGNOSIS — E1165 Type 2 diabetes mellitus with hyperglycemia: Secondary | ICD-10-CM | POA: Diagnosis not present

## 2017-03-27 DIAGNOSIS — M19041 Primary osteoarthritis, right hand: Secondary | ICD-10-CM | POA: Diagnosis not present

## 2017-03-27 DIAGNOSIS — M19042 Primary osteoarthritis, left hand: Secondary | ICD-10-CM | POA: Diagnosis not present

## 2017-03-27 DIAGNOSIS — M1711 Unilateral primary osteoarthritis, right knee: Secondary | ICD-10-CM | POA: Diagnosis not present

## 2017-04-05 ENCOUNTER — Ambulatory Visit (INDEPENDENT_AMBULATORY_CARE_PROVIDER_SITE_OTHER): Payer: Medicare Other | Admitting: Podiatry

## 2017-04-05 NOTE — Progress Notes (Signed)
No show

## 2017-04-06 NOTE — Progress Notes (Signed)
Patient ID: Lauren Waller, female   DOB: [birth year], 61 y.o.   MRN: [id number] No show

## 2017-04-17 DIAGNOSIS — E785 Hyperlipidemia, unspecified: Secondary | ICD-10-CM | POA: Diagnosis not present

## 2017-04-17 DIAGNOSIS — I10 Essential (primary) hypertension: Secondary | ICD-10-CM | POA: Diagnosis not present

## 2017-04-17 DIAGNOSIS — E1165 Type 2 diabetes mellitus with hyperglycemia: Secondary | ICD-10-CM | POA: Diagnosis not present

## 2017-04-17 DIAGNOSIS — D649 Anemia, unspecified: Secondary | ICD-10-CM | POA: Diagnosis not present

## 2017-04-20 DIAGNOSIS — E113513 Type 2 diabetes mellitus with proliferative diabetic retinopathy with macular edema, bilateral: Secondary | ICD-10-CM | POA: Diagnosis not present

## 2017-04-20 DIAGNOSIS — H43823 Vitreomacular adhesion, bilateral: Secondary | ICD-10-CM | POA: Diagnosis not present

## 2017-04-20 DIAGNOSIS — H3582 Retinal ischemia: Secondary | ICD-10-CM | POA: Diagnosis not present

## 2017-04-20 DIAGNOSIS — H35371 Puckering of macula, right eye: Secondary | ICD-10-CM | POA: Diagnosis not present

## 2017-05-04 DIAGNOSIS — N6321 Unspecified lump in the left breast, upper outer quadrant: Secondary | ICD-10-CM | POA: Diagnosis not present

## 2017-05-10 DIAGNOSIS — M47816 Spondylosis without myelopathy or radiculopathy, lumbar region: Secondary | ICD-10-CM | POA: Diagnosis not present

## 2017-05-17 DIAGNOSIS — M545 Low back pain: Secondary | ICD-10-CM | POA: Diagnosis not present

## 2017-05-24 ENCOUNTER — Ambulatory Visit: Payer: Medicare Other | Admitting: Physical Therapy

## 2017-05-25 ENCOUNTER — Encounter: Payer: Self-pay | Admitting: Physical Therapy

## 2017-05-25 ENCOUNTER — Ambulatory Visit: Payer: Medicare Other | Attending: Orthopedic Surgery | Admitting: Physical Therapy

## 2017-05-25 DIAGNOSIS — M545 Low back pain: Secondary | ICD-10-CM | POA: Insufficient documentation

## 2017-05-25 NOTE — Therapy (Addendum)
[facility] [address] [place], [place], [address] Phone: [phone]   Fax:  [phone]  Physical Therapy Treatment  Patient Details  Name: Lauren Waller MRN: [id number] Date of Birth: May 24, 1956 Referring Provider: Romero BellingWesley Ibazebo, MD (pt reports Lunette StandsAnna Voytek, MD)  Encounter Date: 05/25/2017    Past Medical History:  Diagnosis Date  . Depression   . Diabetes mellitus without complication [facility](HCC)     Past Surgical History:  Procedure Laterality Date  . EYE SURGERY Bilateral    2016  . fatty tissue removal from upper back     1998  . HERNIA REPAIR    . ROBOTIC ASSISTED LAPAROSCOPIC VAGINAL HYSTERECTOMY WITH FIBROID REMOVAL     2009    There were no vitals filed for this visit.      Subjective Assessment - 05/25/17 1110    Subjective pt is a 61 y.o F with with CC low back pain that started about 2-3 weeks ago that started with non-specific MOI which she reports could be caused due to the rain. reports some referral of pain inthe R hip but mostly in the L into the toes rpeorted as N/T. pt reports issues with incontenince that started within the last few weeks, and describs numbness and pain in the saddle region and weakness in the LLE.    Currently in Pain? Yes   Pain Score 8    Pain Location Back   Pain Orientation Right   Pain Descriptors / Indicators Burning   Pain Type Chronic pain   Pain Onset More than a month ago   Pain Frequency Constant                                             Plan - 05/25/17 1139    Clinical Impression Statement Per pt reported symptoms, she indicates multiple red flags regarding recent onset of low back pain with LE involvement/ referral, saddle paresthesia, incontinence, and weakness. Patient stated she did not discuss these issues with her MD. Based on subjective findings I recommended she go to the Emergency department, which she refused. Based on her  subjective findings she is not appropriate for physical therapy at this time. I called her MD and left a detailed message.      Patient will benefit from skilled therapeutic intervention in order to improve the following deficits and impairments:     Visit Diagnosis: Acute midline low back pain, with sciatica presence unspecified     Problem List There are no active problems to display for this patient.  Lulu RidingKristoffer Gurney Balthazor PT, DPT, LAT, ATC  05/25/17  12:18 PM      [facility] [address] [place], [place], [address] Phone: [phone]   Fax:  [phone]  Name: Lauren Waller MRN: [id number] Date of Birth: May 24, 1956    Lulu RidingKristoffer Sharnetta Gielow PT, DPT, LAT, ATC  05/25/17  12:17 PM

## 2017-06-06 DIAGNOSIS — M545 Low back pain: Secondary | ICD-10-CM | POA: Diagnosis not present

## 2017-06-13 DIAGNOSIS — M5416 Radiculopathy, lumbar region: Secondary | ICD-10-CM | POA: Diagnosis not present

## 2017-06-13 DIAGNOSIS — M545 Low back pain: Secondary | ICD-10-CM | POA: Diagnosis not present

## 2017-06-15 DIAGNOSIS — M2042 Other hammer toe(s) (acquired), left foot: Secondary | ICD-10-CM | POA: Diagnosis not present

## 2017-06-15 DIAGNOSIS — M2041 Other hammer toe(s) (acquired), right foot: Secondary | ICD-10-CM | POA: Diagnosis not present

## 2017-06-15 DIAGNOSIS — E1351 Other specified diabetes mellitus with diabetic peripheral angiopathy without gangrene: Secondary | ICD-10-CM | POA: Diagnosis not present

## 2017-06-15 DIAGNOSIS — L602 Onychogryphosis: Secondary | ICD-10-CM | POA: Diagnosis not present

## 2017-06-16 DIAGNOSIS — M5416 Radiculopathy, lumbar region: Secondary | ICD-10-CM | POA: Diagnosis not present

## 2017-06-16 DIAGNOSIS — M545 Low back pain: Secondary | ICD-10-CM | POA: Diagnosis not present

## 2017-06-19 DIAGNOSIS — M5416 Radiculopathy, lumbar region: Secondary | ICD-10-CM | POA: Diagnosis not present

## 2017-06-19 DIAGNOSIS — M545 Low back pain: Secondary | ICD-10-CM | POA: Diagnosis not present

## 2017-06-21 DIAGNOSIS — M545 Low back pain: Secondary | ICD-10-CM | POA: Diagnosis not present

## 2017-06-21 DIAGNOSIS — M5416 Radiculopathy, lumbar region: Secondary | ICD-10-CM | POA: Diagnosis not present

## 2017-06-27 DIAGNOSIS — M5416 Radiculopathy, lumbar region: Secondary | ICD-10-CM | POA: Diagnosis not present

## 2017-06-27 DIAGNOSIS — M545 Low back pain: Secondary | ICD-10-CM | POA: Diagnosis not present

## 2017-06-29 DIAGNOSIS — I10 Essential (primary) hypertension: Secondary | ICD-10-CM | POA: Diagnosis not present

## 2017-06-29 DIAGNOSIS — E1165 Type 2 diabetes mellitus with hyperglycemia: Secondary | ICD-10-CM | POA: Diagnosis not present

## 2017-06-29 DIAGNOSIS — H538 Other visual disturbances: Secondary | ICD-10-CM | POA: Diagnosis not present

## 2017-06-29 DIAGNOSIS — M25551 Pain in right hip: Secondary | ICD-10-CM | POA: Diagnosis not present

## 2017-06-29 DIAGNOSIS — E559 Vitamin D deficiency, unspecified: Secondary | ICD-10-CM | POA: Diagnosis not present

## 2017-06-29 DIAGNOSIS — D649 Anemia, unspecified: Secondary | ICD-10-CM | POA: Diagnosis not present

## 2017-06-29 DIAGNOSIS — Z01118 Encounter for examination of ears and hearing with other abnormal findings: Secondary | ICD-10-CM | POA: Diagnosis not present

## 2017-06-29 DIAGNOSIS — Z0000 Encounter for general adult medical examination without abnormal findings: Secondary | ICD-10-CM | POA: Diagnosis not present

## 2017-06-29 DIAGNOSIS — E785 Hyperlipidemia, unspecified: Secondary | ICD-10-CM | POA: Diagnosis not present

## 2017-07-05 DIAGNOSIS — M5416 Radiculopathy, lumbar region: Secondary | ICD-10-CM | POA: Diagnosis not present

## 2017-07-05 DIAGNOSIS — M545 Low back pain: Secondary | ICD-10-CM | POA: Diagnosis not present

## 2017-07-10 DIAGNOSIS — M545 Low back pain: Secondary | ICD-10-CM | POA: Diagnosis not present

## 2017-07-10 DIAGNOSIS — M5416 Radiculopathy, lumbar region: Secondary | ICD-10-CM | POA: Diagnosis not present

## 2017-07-17 DIAGNOSIS — M48061 Spinal stenosis, lumbar region without neurogenic claudication: Secondary | ICD-10-CM | POA: Diagnosis not present

## 2017-07-18 DIAGNOSIS — M5416 Radiculopathy, lumbar region: Secondary | ICD-10-CM | POA: Diagnosis not present

## 2017-07-18 DIAGNOSIS — M545 Low back pain: Secondary | ICD-10-CM | POA: Diagnosis not present

## 2017-07-24 DIAGNOSIS — I10 Essential (primary) hypertension: Secondary | ICD-10-CM | POA: Diagnosis not present

## 2017-07-24 DIAGNOSIS — D649 Anemia, unspecified: Secondary | ICD-10-CM | POA: Diagnosis not present

## 2017-07-24 DIAGNOSIS — E1165 Type 2 diabetes mellitus with hyperglycemia: Secondary | ICD-10-CM | POA: Diagnosis not present

## 2017-07-26 DIAGNOSIS — E782 Mixed hyperlipidemia: Secondary | ICD-10-CM | POA: Diagnosis not present

## 2017-07-26 DIAGNOSIS — I10 Essential (primary) hypertension: Secondary | ICD-10-CM | POA: Diagnosis not present

## 2017-07-26 DIAGNOSIS — E11319 Type 2 diabetes mellitus with unspecified diabetic retinopathy without macular edema: Secondary | ICD-10-CM | POA: Diagnosis not present

## 2017-07-26 DIAGNOSIS — R0989 Other specified symptoms and signs involving the circulatory and respiratory systems: Secondary | ICD-10-CM | POA: Diagnosis not present

## 2017-08-09 DIAGNOSIS — R928 Other abnormal and inconclusive findings on diagnostic imaging of breast: Secondary | ICD-10-CM | POA: Diagnosis not present

## 2017-08-09 DIAGNOSIS — Z09 Encounter for follow-up examination after completed treatment for conditions other than malignant neoplasm: Secondary | ICD-10-CM | POA: Diagnosis not present

## 2017-08-22 DIAGNOSIS — R0989 Other specified symptoms and signs involving the circulatory and respiratory systems: Secondary | ICD-10-CM | POA: Diagnosis not present

## 2017-08-22 DIAGNOSIS — E11319 Type 2 diabetes mellitus with unspecified diabetic retinopathy without macular edema: Secondary | ICD-10-CM | POA: Diagnosis not present

## 2017-08-22 DIAGNOSIS — E1165 Type 2 diabetes mellitus with hyperglycemia: Secondary | ICD-10-CM | POA: Diagnosis not present

## 2017-08-22 DIAGNOSIS — I10 Essential (primary) hypertension: Secondary | ICD-10-CM | POA: Diagnosis not present

## 2017-08-29 ENCOUNTER — Encounter: Payer: Self-pay | Admitting: Vascular Surgery

## 2017-08-29 ENCOUNTER — Ambulatory Visit (INDEPENDENT_AMBULATORY_CARE_PROVIDER_SITE_OTHER): Payer: Medicare Other | Admitting: Vascular Surgery

## 2017-08-29 VITALS — BP 185/94 | HR 68 | Temp 99.0°F | Resp 18 | Ht 64.0 in | Wt 170.8 lb

## 2017-08-29 DIAGNOSIS — I83893 Varicose veins of bilateral lower extremities with other complications: Secondary | ICD-10-CM

## 2017-08-29 NOTE — Progress Notes (Signed)
Subjective:     Patient ID: Lauren Waller, female   DOB: 08/02/1956, 61 y.o.   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 She recently noticed some discomfort in the posterior aspect of her left thigh which is a new finding. She has no history of DVT thrombophlebitis stasis ulcers or bleeding. She does not develop swelling in ankles as the day progresses. Her mother had varicose veins. She does not wear compression stockings.  Past Medical History:  Diagnosis Date  . Depression   . Diabetes mellitus without complication (HCC)   . Hypertension     Social History   Tobacco Use  . Smoking status: Never Smoker  . Smokeless tobacco: Never Used  Substance Use Topics  . Alcohol use: No    History reviewed. No pertinent family history.  Allergies  Allergen Reactions  . Januvia [Sitagliptin] Swelling  . Metformin And Related      Current Outpatient Medications:  .  AMLODIPINE BESYLATE PO, Take by mouth., Disp: , Rfl:  .  hydrALAZINE (APRESOLINE) 25 MG tablet, Take 25 mg by mouth 3 (three) times daily., Disp: , Rfl:  .  lisinopril-hydrochlorothiazide (PRINZIDE,ZESTORETIC) 20-12.5 MG tablet, Take 1 tablet by mouth daily., Disp: , Rfl:  .  saxagliptin HCl (ONGLYZA) 5 MG TABS tablet, Take by mouth daily., Disp: , Rfl:  .  atorvastatin (LIPITOR) 20 MG tablet, Take 20 mg by mouth daily., Disp: , Rfl:  .  colchicine 0.6 MG tablet, Take two 0.6mg  tablets initially, then take 0.6mg  (one tablet) by mouth every 1-2 hours after until one of the following occurs: 1.  The pain is gone 2.  The maximum dose has been given (no more than 3 tabs in 3 hours or 10 tabs in 24 hours) 3.  The side effects outweight the benefits (Patient not taking: Reported on 08/29/2017), Disp: 10 tablet, Rfl: 0 .  diclofenac sodium (VOLTAREN) 1 % GEL, Apply topically 4 (four) times daily., Disp: , Rfl:  .  fluticasone  (FLONASE) 50 MCG/ACT nasal spray, Place into both nostrils daily., Disp: , Rfl:  .  gabapentin (NEURONTIN) 100 MG capsule, Take 100 mg by mouth 3 (three) times daily., Disp: , Rfl:  .  glimepiride (AMARYL) 4 MG tablet, Take 4 mg by mouth daily with breakfast., Disp: , Rfl:  .  ibuprofen (ADVIL,MOTRIN) 600 MG tablet, Take 1 tablet (600 mg total) by mouth every 6 (six) hours as needed. (Patient not taking: Reported on 08/29/2017), Disp: 30 tablet, Rfl: 0 .  meloxicam (MOBIC) 15 MG tablet, Take 15 mg by mouth daily., Disp: , Rfl:  .  metoprolol-hydrochlorothiazide (LOPRESSOR HCT) 100-25 MG tablet, Take 1 tablet by mouth daily., Disp: , Rfl:  .  mometasone (NASONEX) 50 MCG/ACT nasal spray, Place 2 sprays into the nose daily., Disp: , Rfl:  .  Omega-3-acid Ethyl Esters (LOVAZA PO), Take by mouth., Disp: , Rfl:  .  oxyCODONE-acetaminophen (PERCOCET/ROXICET) 5-325 MG tablet, Take 1 tablet by mouth every 4 (four) hours as needed for severe pain. (Patient not taking: Reported on 08/29/2017), Disp: 15 tablet, Rfl: 0 .  saxagliptin HCl (ONGLYZA) 5 MG TABS tablet, Take by mouth daily., Disp: , Rfl:   Vitals:   08/29/17 1202 08/29/17 1211  BP: (!) 202/98 (!) 185/94  Pulse: 68   Resp: 18   Temp: 99 F (37.2 C)   TempSrc: Oral   SpO2: 99%   Weight: 170 lb  12.8 oz (77.5 kg)   Height: 5\' 4"  (1.626 m)     Body mass index is 29.32 kg/m.         Review of Systems Denies chest pain, dyspnea on exertion, PND, orthopnea, hemoptysis, claudication. Does have diabetes mellitus type 2 and hypertension.    Objective:   Physical Exam BP (!) 185/94 (BP Location: Left Arm, Patient Position: Sitting, Cuff Size: Normal)   Pulse 68   Temp 99 F (37.2 C) (Oral)   Resp 18   Ht 5\' 4"  (1.626 m)   Wt 170 lb 12.8 oz (77.5 kg)   SpO2 99%   BMI 29.32 kg/m     Gen.-alert and oriented x3 in no apparent distress HEENT normal for age Lungs no rhonchi or wheezing Cardiovascular regular rhythm no murmurs carotid  pulses 3+ palpable no bruits audible Abdomen soft nontender no palpable masses Musculoskeletal free of  major deformities Skin clear -no rashes Neurologic normal Lower extremities 3+ femoral and dorsalis pedis pulses palpable bilaterally with no edema Scattered varicosities in the posterior distal left thigh extending down to the lateral calf with some reticular and spider veins medial and laterally anterior thigh Right leg with a few small varicosities lateral thigh and calf. No hyperpigmentation or ulceration noted bilaterally.  Today I performed a bedside SonoSite ultrasound exam which revealed normal-size great saphenous veins bilaterally with no evidence of reflux      Assessment:     Scattered varicosities both legs more prominent in the left distal posterior thigh which is not were patient is having discomfort History of lumbar spine disease Diabetes mellitus type 2 Hypertension    Plan:     Patient has no indication for any venous intervention. Possible that her posterior symptoms are due to her lumbar spine disease or other musculoskeletal problems. Discussed this with her and there are no plans for any treatment at this time which I agree with.

## 2017-08-30 ENCOUNTER — Encounter: Payer: Self-pay | Admitting: Internal Medicine

## 2017-08-31 DIAGNOSIS — I10 Essential (primary) hypertension: Secondary | ICD-10-CM | POA: Diagnosis not present

## 2017-08-31 DIAGNOSIS — R0989 Other specified symptoms and signs involving the circulatory and respiratory systems: Secondary | ICD-10-CM | POA: Diagnosis not present

## 2017-09-04 DIAGNOSIS — Z0000 Encounter for general adult medical examination without abnormal findings: Secondary | ICD-10-CM | POA: Diagnosis not present

## 2017-09-04 DIAGNOSIS — E1165 Type 2 diabetes mellitus with hyperglycemia: Secondary | ICD-10-CM | POA: Diagnosis not present

## 2017-09-04 DIAGNOSIS — E785 Hyperlipidemia, unspecified: Secondary | ICD-10-CM | POA: Diagnosis not present

## 2017-09-04 DIAGNOSIS — I10 Essential (primary) hypertension: Secondary | ICD-10-CM | POA: Diagnosis not present

## 2017-09-20 DIAGNOSIS — I10 Essential (primary) hypertension: Secondary | ICD-10-CM | POA: Diagnosis not present

## 2017-09-20 DIAGNOSIS — E11319 Type 2 diabetes mellitus with unspecified diabetic retinopathy without macular edema: Secondary | ICD-10-CM | POA: Diagnosis not present

## 2017-09-20 DIAGNOSIS — E1165 Type 2 diabetes mellitus with hyperglycemia: Secondary | ICD-10-CM | POA: Diagnosis not present

## 2017-09-20 DIAGNOSIS — R0989 Other specified symptoms and signs involving the circulatory and respiratory systems: Secondary | ICD-10-CM | POA: Diagnosis not present

## 2017-10-19 DIAGNOSIS — I10 Essential (primary) hypertension: Secondary | ICD-10-CM | POA: Diagnosis not present

## 2017-10-20 ENCOUNTER — Encounter: Payer: Self-pay | Admitting: Podiatry

## 2017-10-20 ENCOUNTER — Ambulatory Visit (INDEPENDENT_AMBULATORY_CARE_PROVIDER_SITE_OTHER): Payer: Medicare Other | Admitting: Podiatry

## 2017-10-20 DIAGNOSIS — B351 Tinea unguium: Secondary | ICD-10-CM

## 2017-10-20 DIAGNOSIS — E1142 Type 2 diabetes mellitus with diabetic polyneuropathy: Secondary | ICD-10-CM

## 2017-10-20 NOTE — Progress Notes (Signed)
Complaint:  Visit Type: Patient returns to my office for continued preventative foot care services. Complaint: Patient states" my nails have grown long and thick and become painful to walk and wear shoes" Patient has been diagnosed with DM with neuropathy.. The patient presents for preventative foot care services. No changes to ROS  Podiatric Exam: Vascular: dorsalis pedis and posterior tibial pulses are palpable bilateral. Capillary return is immediate. Temperature gradient is WNL. Skin turgor WNL  Sensorium: Diminished Semmes Weinstein monofilament test. Normal tactile sensation bilaterally. Nail Exam: Pt has thick disfigured discolored nails with subungual debris noted bilateral entire nail hallux through fifth toenails Ulcer Exam: There is no evidence of ulcer or pre-ulcerative changes or infection. Orthopedic Exam: Muscle tone and strength are WNL. No limitations in general ROM. No crepitus or effusions noted. Foot type and digits show no abnormalities. Bony prominences are unremarkable. Skin: No Porokeratosis. No infection or ulcers  Diagnosis:  Onychomycosis, , Pain in right toe, pain in left toes  Treatment & Plan Procedures and Treatment: Consent by patient was obtained for treatment procedures.   Debridement of mycotic and hypertrophic toenails, 1 through 5 bilateral and clearing of subungual debris. No ulceration, no infection noted.  Return Visit-Office Procedure: Patient instructed to return to the office for a follow up visit 3 months for continued evaluation and treatment.    Serina Nichter DPM 

## 2017-11-16 DIAGNOSIS — I10 Essential (primary) hypertension: Secondary | ICD-10-CM | POA: Diagnosis not present

## 2017-11-16 DIAGNOSIS — E1165 Type 2 diabetes mellitus with hyperglycemia: Secondary | ICD-10-CM | POA: Diagnosis not present

## 2017-11-16 DIAGNOSIS — E559 Vitamin D deficiency, unspecified: Secondary | ICD-10-CM | POA: Diagnosis not present

## 2017-11-16 DIAGNOSIS — E785 Hyperlipidemia, unspecified: Secondary | ICD-10-CM | POA: Diagnosis not present

## 2017-11-16 DIAGNOSIS — D649 Anemia, unspecified: Secondary | ICD-10-CM | POA: Diagnosis not present

## 2017-11-21 DIAGNOSIS — I83813 Varicose veins of bilateral lower extremities with pain: Secondary | ICD-10-CM | POA: Diagnosis not present

## 2017-11-21 DIAGNOSIS — I10 Essential (primary) hypertension: Secondary | ICD-10-CM | POA: Diagnosis not present

## 2017-11-21 DIAGNOSIS — E785 Hyperlipidemia, unspecified: Secondary | ICD-10-CM | POA: Diagnosis not present

## 2017-11-21 DIAGNOSIS — E559 Vitamin D deficiency, unspecified: Secondary | ICD-10-CM | POA: Diagnosis not present

## 2017-11-21 DIAGNOSIS — E1165 Type 2 diabetes mellitus with hyperglycemia: Secondary | ICD-10-CM | POA: Diagnosis not present

## 2017-11-21 DIAGNOSIS — D649 Anemia, unspecified: Secondary | ICD-10-CM | POA: Diagnosis not present

## 2017-11-23 DIAGNOSIS — I10 Essential (primary) hypertension: Secondary | ICD-10-CM | POA: Diagnosis not present

## 2017-11-23 DIAGNOSIS — D649 Anemia, unspecified: Secondary | ICD-10-CM | POA: Diagnosis not present

## 2017-11-23 DIAGNOSIS — I83813 Varicose veins of bilateral lower extremities with pain: Secondary | ICD-10-CM | POA: Diagnosis not present

## 2017-11-23 DIAGNOSIS — E785 Hyperlipidemia, unspecified: Secondary | ICD-10-CM | POA: Diagnosis not present

## 2017-11-23 DIAGNOSIS — E559 Vitamin D deficiency, unspecified: Secondary | ICD-10-CM | POA: Diagnosis not present

## 2017-11-23 DIAGNOSIS — E1165 Type 2 diabetes mellitus with hyperglycemia: Secondary | ICD-10-CM | POA: Diagnosis not present

## 2017-12-14 ENCOUNTER — Ambulatory Visit: Payer: Medicare Other | Admitting: Registered"

## 2017-12-21 ENCOUNTER — Ambulatory Visit: Payer: Medicare Other | Admitting: Dietician

## 2017-12-28 ENCOUNTER — Ambulatory Visit: Payer: Medicare Other | Admitting: Dietician

## 2018-01-23 ENCOUNTER — Ambulatory Visit: Payer: Medicare Other | Admitting: Podiatry

## 2018-04-17 ENCOUNTER — Inpatient Hospital Stay (HOSPITAL_COMMUNITY)
Admission: EM | Admit: 2018-04-17 | Discharge: 2018-04-21 | DRG: 872 | Disposition: A | Discharge status: Home / Self Care | Payer: Medicare Other | Attending: Internal Medicine | Admitting: Internal Medicine

## 2018-04-17 ENCOUNTER — Emergency Department (HOSPITAL_COMMUNITY): Payer: Medicare Other

## 2018-04-17 ENCOUNTER — Other Ambulatory Visit: Payer: Self-pay

## 2018-04-17 ENCOUNTER — Encounter (HOSPITAL_COMMUNITY): Payer: Self-pay

## 2018-04-17 DIAGNOSIS — Z90710 Acquired absence of both cervix and uterus: Secondary | ICD-10-CM

## 2018-04-17 DIAGNOSIS — Z800 Family history of malignant neoplasm of digestive organs: Secondary | ICD-10-CM

## 2018-04-17 DIAGNOSIS — Z791 Long term (current) use of non-steroidal anti-inflammatories (NSAID): Secondary | ICD-10-CM

## 2018-04-17 DIAGNOSIS — Z888 Allergy status to other drugs, medicaments and biological substances status: Secondary | ICD-10-CM | POA: Diagnosis not present

## 2018-04-17 DIAGNOSIS — E785 Hyperlipidemia, unspecified: Secondary | ICD-10-CM | POA: Diagnosis present

## 2018-04-17 DIAGNOSIS — E1165 Type 2 diabetes mellitus with hyperglycemia: Secondary | ICD-10-CM | POA: Diagnosis present

## 2018-04-17 DIAGNOSIS — I160 Hypertensive urgency: Secondary | ICD-10-CM | POA: Diagnosis present

## 2018-04-17 DIAGNOSIS — I10 Essential (primary) hypertension: Secondary | ICD-10-CM | POA: Diagnosis present

## 2018-04-17 DIAGNOSIS — K047 Periapical abscess without sinus: Secondary | ICD-10-CM | POA: Diagnosis present

## 2018-04-17 DIAGNOSIS — M109 Gout, unspecified: Secondary | ICD-10-CM | POA: Diagnosis present

## 2018-04-17 DIAGNOSIS — L03211 Cellulitis of face: Secondary | ICD-10-CM | POA: Diagnosis present

## 2018-04-17 DIAGNOSIS — F329 Major depressive disorder, single episode, unspecified: Secondary | ICD-10-CM | POA: Diagnosis present

## 2018-04-17 DIAGNOSIS — K0889 Other specified disorders of teeth and supporting structures: Secondary | ICD-10-CM

## 2018-04-17 DIAGNOSIS — Z7951 Long term (current) use of inhaled steroids: Secondary | ICD-10-CM

## 2018-04-17 DIAGNOSIS — Z7984 Long term (current) use of oral hypoglycemic drugs: Secondary | ICD-10-CM

## 2018-04-17 DIAGNOSIS — E119 Type 2 diabetes mellitus without complications: Secondary | ICD-10-CM

## 2018-04-17 DIAGNOSIS — A419 Sepsis, unspecified organism: Secondary | ICD-10-CM | POA: Diagnosis present

## 2018-04-17 DIAGNOSIS — Z8249 Family history of ischemic heart disease and other diseases of the circulatory system: Secondary | ICD-10-CM | POA: Diagnosis not present

## 2018-04-17 HISTORY — DX: Other chronic pain: G89.29

## 2018-04-17 HISTORY — DX: Type 2 diabetes mellitus without complications: E11.9

## 2018-04-17 HISTORY — DX: Low back pain: M54.5

## 2018-04-17 HISTORY — DX: Cellulitis of face: L03.211

## 2018-04-17 HISTORY — DX: Low back pain, unspecified: M54.50

## 2018-04-17 HISTORY — DX: Hyperlipidemia, unspecified: E78.5

## 2018-04-17 LAB — BASIC METABOLIC PANEL
Anion gap: 11 (ref 5–15)
BUN: 13 mg/dL (ref 6–20)
CO2: 26 mmol/L (ref 22–32)
CREATININE: 0.75 mg/dL (ref 0.44–1.00)
Calcium: 9.5 mg/dL (ref 8.9–10.3)
Chloride: 95 mmol/L — ABNORMAL LOW (ref 98–111)
GFR calc Af Amer: >60 mL/min (ref >60)
GLUCOSE: 379 mg/dL — AB (ref 70–99)
POTASSIUM: 3.7 mmol/L (ref 3.5–5.1)
Sodium: 132 mmol/L — ABNORMAL LOW (ref 135–145)

## 2018-04-17 LAB — CBC WITH DIFFERENTIAL/PLATELET
ABS IMMATURE GRANULOCYTES: 0.1 10*3/uL (ref 0.0–0.1)
Basophils Absolute: 0 10*3/uL (ref 0.0–0.1)
Basophils Relative: 0 %
Eosinophils Absolute: 0 10*3/uL (ref 0.0–0.7)
Eosinophils Relative: 0 %
HEMATOCRIT: 37 % (ref 36.0–46.0)
Hemoglobin: 11.3 g/dL — ABNORMAL LOW (ref 12.0–15.0)
Immature Granulocytes: 0 %
LYMPHS ABS: 2.2 10*3/uL (ref 0.7–4.0)
LYMPHS PCT: 17 %
MCH: 27.3 pg (ref 26.0–34.0)
MCHC: 30.5 g/dL (ref 30.0–36.0)
MCV: 89.4 fL (ref 78.0–100.0)
MONO ABS: 0.9 10*3/uL (ref 0.1–1.0)
Monocytes Relative: 7 %
NEUTROS ABS: 9.5 10*3/uL — AB (ref 1.7–7.7)
Neutrophils Relative %: 76 %
Platelets: 330 10*3/uL (ref 150–400)
RBC: 4.14 MIL/uL (ref 3.87–5.11)
RDW: 12.3 % (ref 11.5–15.5)
WBC: 12.7 10*3/uL — ABNORMAL HIGH (ref 4.0–10.5)

## 2018-04-17 LAB — LACTIC ACID, PLASMA: LACTIC ACID, VENOUS: 1.4 mmol/L (ref 0.5–1.9)

## 2018-04-17 LAB — GLUCOSE, CAPILLARY: GLUCOSE-CAPILLARY: 274 mg/dL — AB (ref 70–99)

## 2018-04-17 LAB — SEDIMENTATION RATE: Sed Rate: 131 mm/hr — ABNORMAL HIGH (ref 0–22)

## 2018-04-17 LAB — PROCALCITONIN: Procalcitonin: <0.1 ng/mL

## 2018-04-17 LAB — I-STAT CG4 LACTIC ACID, ED: LACTIC ACID, VENOUS: 1.27 mmol/L (ref 0.5–1.9)

## 2018-04-17 LAB — C-REACTIVE PROTEIN: CRP: 10.1 mg/dL — ABNORMAL HIGH (ref <1.0)

## 2018-04-17 MED ORDER — ATORVASTATIN CALCIUM 20 MG PO TABS
20.0000 mg | ORAL_TABLET | Freq: Every day | ORAL | Status: DC
  Administered 2018-04-20: 20 mg via ORAL
  Filled 2018-04-17 (×3): qty 1

## 2018-04-17 MED ORDER — HYDRALAZINE HCL 20 MG/ML IJ SOLN
10.0000 mg | Freq: Once | INTRAMUSCULAR | Status: AC
  Administered 2018-04-17: 10 mg via INTRAVENOUS
  Filled 2018-04-17: qty 1

## 2018-04-17 MED ORDER — LISINOPRIL-HYDROCHLOROTHIAZIDE 20-12.5 MG PO TABS: 1.0000 | ORAL_TABLET | Freq: Every day | ORAL | Status: DC

## 2018-04-17 MED ORDER — OXYCODONE-ACETAMINOPHEN 5-325 MG PO TABS: 1.0000 | ORAL_TABLET | ORAL | Status: DC | PRN

## 2018-04-17 MED ORDER — ENOXAPARIN SODIUM 40 MG/0.4ML ~~LOC~~ SOLN
40.0000 mg | SUBCUTANEOUS | Status: DC
  Administered 2018-04-17 – 2018-04-20 (×4): 40 mg via SUBCUTANEOUS
  Filled 2018-04-17 (×4): qty 0.4

## 2018-04-17 MED ORDER — MORPHINE SULFATE (PF) 4 MG/ML IV SOLN: 2.0000 mg | INTRAVENOUS | Status: DC | PRN

## 2018-04-17 MED ORDER — HYDRALAZINE HCL 20 MG/ML IJ SOLN
5.0000 mg | INTRAMUSCULAR | Status: DC | PRN
Start: 2018-04-17 — End: 2018-04-21

## 2018-04-17 MED ORDER — INSULIN ASPART 100 UNIT/ML ~~LOC~~ SOLN
0.0000 [IU] | Freq: Three times a day (TID) | SUBCUTANEOUS | Status: DC
  Administered 2018-04-18 (×2): 3 [IU] via SUBCUTANEOUS
  Administered 2018-04-18: 5 [IU] via SUBCUTANEOUS
  Administered 2018-04-19 – 2018-04-20 (×3): 3 [IU] via SUBCUTANEOUS
  Administered 2018-04-20: 5 [IU] via SUBCUTANEOUS
  Administered 2018-04-21: 2 [IU] via SUBCUTANEOUS

## 2018-04-17 MED ORDER — MORPHINE SULFATE (PF) 4 MG/ML IV SOLN
4.0000 mg | Freq: Once | INTRAVENOUS | Status: AC
  Administered 2018-04-17: 4 mg via INTRAVENOUS
  Filled 2018-04-17: qty 1

## 2018-04-17 MED ORDER — POLYETHYLENE GLYCOL 3350 17 G PO PACK: 17.0000 g | PACK | Freq: Every day | ORAL | Status: DC | PRN

## 2018-04-17 MED ORDER — METOPROLOL-HYDROCHLOROTHIAZIDE 100-25 MG PO TABS: 1.0000 | ORAL_TABLET | Freq: Every day | ORAL | Status: DC

## 2018-04-17 MED ORDER — ONDANSETRON HCL 4 MG/2ML IJ SOLN
4.0000 mg | Freq: Three times a day (TID) | INTRAMUSCULAR | Status: DC | PRN
Start: 2018-04-17 — End: 2018-04-21

## 2018-04-17 MED ORDER — SODIUM CHLORIDE 0.9 % IV BOLUS
500.0000 mL | Freq: Once | INTRAVENOUS | Status: AC
  Administered 2018-04-17: 500 mL via INTRAVENOUS

## 2018-04-17 MED ORDER — SODIUM CHLORIDE 0.9 % IV SOLN
INTRAVENOUS | Status: DC
  Administered 2018-04-17 – 2018-04-19 (×4): via INTRAVENOUS

## 2018-04-17 MED ORDER — GABAPENTIN 100 MG PO CAPS
100.0000 mg | ORAL_CAPSULE | Freq: Three times a day (TID) | ORAL | Status: DC
  Administered 2018-04-18: 100 mg via ORAL
  Filled 2018-04-17 (×3): qty 1

## 2018-04-17 MED ORDER — CLINDAMYCIN PHOSPHATE 600 MG/50ML IV SOLN
600.0000 mg | Freq: Three times a day (TID) | INTRAVENOUS | Status: DC
  Administered 2018-04-18 – 2018-04-21 (×10): 600 mg via INTRAVENOUS
  Filled 2018-04-17 (×11): qty 50

## 2018-04-17 MED ORDER — AMLODIPINE BESYLATE 5 MG PO TABS
10.0000 mg | ORAL_TABLET | Freq: Every day | ORAL | Status: DC
  Administered 2018-04-18 – 2018-04-21 (×4): 10 mg via ORAL
  Filled 2018-04-17 (×5): qty 2

## 2018-04-17 MED ORDER — ATENOLOL 50 MG PO TABS
100.0000 mg | ORAL_TABLET | Freq: Every day | ORAL | Status: DC
  Administered 2018-04-17 – 2018-04-21 (×5): 100 mg via ORAL
  Filled 2018-04-17 (×5): qty 2

## 2018-04-17 MED ORDER — HYDROCHLOROTHIAZIDE 25 MG PO TABS
25.0000 mg | ORAL_TABLET | Freq: Every day | ORAL | Status: DC
  Administered 2018-04-18 – 2018-04-21 (×4): 25 mg via ORAL
  Filled 2018-04-17 (×5): qty 1

## 2018-04-17 MED ORDER — IBUPROFEN 400 MG PO TABS
400.0000 mg | ORAL_TABLET | Freq: Four times a day (QID) | ORAL | Status: DC | PRN
  Administered 2018-04-18: 400 mg via ORAL
  Filled 2018-04-17: qty 1

## 2018-04-17 MED ORDER — FLUTICASONE PROPIONATE 50 MCG/ACT NA SUSP
1.0000 | Freq: Every day | NASAL | Status: DC
  Filled 2018-04-17: qty 16

## 2018-04-17 MED ORDER — IOHEXOL 300 MG/ML  SOLN
100.0000 mL | Freq: Once | INTRAMUSCULAR | Status: AC | PRN
  Administered 2018-04-17: 100 mL via INTRAVENOUS

## 2018-04-17 MED ORDER — INSULIN ASPART 100 UNIT/ML ~~LOC~~ SOLN
0.0000 [IU] | Freq: Every day | SUBCUTANEOUS | Status: DC
  Administered 2018-04-17 – 2018-04-18 (×2): 2 [IU] via SUBCUTANEOUS
  Administered 2018-04-19: 3 [IU] via SUBCUTANEOUS
  Administered 2018-04-20: 2 [IU] via SUBCUTANEOUS

## 2018-04-17 MED ORDER — ZOLPIDEM TARTRATE 5 MG PO TABS: 5.0000 mg | ORAL_TABLET | Freq: Every evening | ORAL | Status: DC | PRN

## 2018-04-17 MED ORDER — BENAZEPRIL HCL 20 MG PO TABS
20.0000 mg | ORAL_TABLET | Freq: Every day | ORAL | Status: DC
  Administered 2018-04-18 – 2018-04-21 (×4): 20 mg via ORAL
  Filled 2018-04-17 (×5): qty 1

## 2018-04-17 MED ORDER — ACETAMINOPHEN 325 MG PO TABS
650.0000 mg | ORAL_TABLET | Freq: Four times a day (QID) | ORAL | Status: DC | PRN
  Administered 2018-04-18: 650 mg via ORAL
  Filled 2018-04-17: qty 2

## 2018-04-17 MED ORDER — ENSURE ENLIVE PO LIQD: 237.0000 mL | Freq: Two times a day (BID) | ORAL | Status: DC

## 2018-04-17 MED ORDER — OMEGA-3-ACID ETHYL ESTERS 1 G PO CAPS
1.0000 g | ORAL_CAPSULE | Freq: Every day | ORAL | Status: DC
  Administered 2018-04-17 – 2018-04-21 (×5): 1 g via ORAL
  Filled 2018-04-17 (×5): qty 1

## 2018-04-17 MED ORDER — CLINDAMYCIN PHOSPHATE 600 MG/50ML IV SOLN
600.0000 mg | Freq: Once | INTRAVENOUS | Status: AC
  Administered 2018-04-17: 600 mg via INTRAVENOUS
  Filled 2018-04-17: qty 50

## 2018-04-17 MED ORDER — HYDRALAZINE HCL 25 MG PO TABS
25.0000 mg | ORAL_TABLET | Freq: Three times a day (TID) | ORAL | Status: DC
  Administered 2018-04-17 – 2018-04-21 (×11): 25 mg via ORAL
  Filled 2018-04-17 (×11): qty 1

## 2018-04-17 NOTE — H&P (Signed)
History and Physical    Lauren Waller UEK:[id number] DOB: 12-19-1955 DOA: 04/17/2018  Referring MD/NP/PA:   PCP: System, Pcp Not In   Patient coming from:  The patient is coming from home.  At baseline, pt is independent for most of ADL.   Chief Complaint: left facial swelling and dental pain  HPI: Lauren Waller is a 62 y.o. female with medical history significant of hypertension, hyperlipidemia, diabetes mellitus, gout, depression, who presents with left-sided dental pain and left facial swelling.  Pt states that she has dental problem in her left upper and lower molars since 01/2018. She was seen by dentist, but was unable to extract them in April due to elevated blood pressure. Her dental pain has  worsened in the past 4 days. She developed left facial swelling. She has trismus, and is difficult opening her mouth due to pain.  Pain is constant, sharp, severe, aggravated by chewing. She had mild subjective fever, but no chills. The pain is spreading down to her left sided anterior neck. No difficulty breathing. She was seen by dentist today who suspect pt may have abscess and sent to ER for further evaluation and treatment. Patient denies chest pain, cough, difficulty breathing.  No nausea, vomiting, diarrhea, abdominal pain, symptoms of UTI or unilateral weakness.  ED Course: pt was found to have WBC 12.7, lactic acid 1.29, temperature 99.1, tachycardia, no tachypnea, oxygen saturation 98%, elevated blood pressure 212/92, electrolytes renal function okay.  Patient is admitted to telemetry bed as inpatient  CT of neck soft tissue showed left facial odontogenic cellulitis with presumed source being the periapical lucency at tooth 18, where there is dehiscence of the overlying mandibular cortex.  Review of Systems:   General: has subjective fevers, no chills, no body weight gain, has poor appetite, has fatigue HEENT: no blurry vision, hearing changes or sore throat. Has left-sided  dental pain and facial swelling. Respiratory: no dyspnea, coughing, wheezing CV: no chest pain, no palpitations GI: no nausea, vomiting, abdominal pain, diarrhea, constipation GU: no dysuria, burning on urination, increased urinary frequency, hematuria  Ext: no leg edema Neuro: no unilateral weakness, numbness, or tingling, no vision change or hearing loss Skin: no rash, no skin tear. MSK: No muscle spasm, no deformity, no limitation of range of movement in spin Heme: No easy bruising.  Travel history: No recent long distant travel.  Allergy:  Allergies  Allergen Reactions  . Januvia [Sitagliptin] Swelling  . Metformin And Related     Past Medical History:  Diagnosis Date  . Depression   . Diabetes mellitus without complication ([place])   . HLD (hyperlipidemia)   . Hypertension     Past Surgical History:  Procedure Laterality Date  . EYE SURGERY Bilateral    2016  . fatty tissue removal from upper back     1998  . HERNIA REPAIR    . MYOMECTOMY    . ROBOTIC ASSISTED LAPAROSCOPIC VAGINAL HYSTERECTOMY WITH FIBROID REMOVAL     2009    Social History:  reports that she has never smoked. She has never used smokeless tobacco. She reports that she does not drink alcohol. Her drug history is not on file.  Family History:  Family History  Problem Relation Age of Onset  . Hypertension Mother   . Pancreatic cancer Brother      Prior to Admission medications   Medication Sig Start Date End Date Taking? Authorizing Provider  AMLODIPINE BESYLATE PO Take by mouth.    [provider]  atorvastatin (LIPITOR) 20 MG tablet Take 20 mg by mouth daily.    [provider]  colchicine 0.6 MG tablet Take two 0.84m tablets initially, then take 0.640m(one tablet) by mouth every 1-2 hours after until one of the following occurs: 1.  The pain is gone 2.  The maximum dose has been given (no more than 3 tabs in 3 hours or 10 tabs in 24 hours) 3.  The side effects outweight the  benefits Patient not taking: Reported on 08/29/2017 02/10/17   NaNona DellPA-C  diclofenac sodium (VOLTAREN) 1 % GEL Apply topically 4 (four) times daily.    [provider]  fluticasone (FLONASE) 50 MCG/ACT nasal spray Place into both nostrils daily.    [provider]  gabapentin (NEURONTIN) 100 MG capsule Take 100 mg by mouth 3 (three) times daily.    [provider]  glimepiride (AMARYL) 4 MG tablet Take 4 mg by mouth daily with breakfast.    [provider]  hydrALAZINE (APRESOLINE) 25 MG tablet Take 25 mg by mouth 3 (three) times daily.    [provider]  ibuprofen (ADVIL,MOTRIN) 600 MG tablet Take 1 tablet (600 mg total) by mouth every 6 (six) hours as needed. Patient not taking: Reported on 08/29/2017 02/10/17   NaNona DellPA-C  lisinopril-hydrochlorothiazide (PRINZIDE,ZESTORETIC) 20-12.5 MG tablet Take 1 tablet by mouth daily.    [provider]  meloxicam (MOBIC) 15 MG tablet Take 15 mg by mouth daily.    [provider]  metoprolol-hydrochlorothiazide (LOPRESSOR HCT) 100-25 MG tablet Take 1 tablet by mouth daily.    [provider]  mometasone (NASONEX) 50 MCG/ACT nasal spray Place 2 sprays into the nose daily.    [provider]  Omega-3-acid Ethyl Esters (LOVAZA PO) Take by mouth.    [provider]  oxyCODONE-acetaminophen (PERCOCET/ROXICET) 5-325 MG tablet Take 1 tablet by mouth every 4 (four) hours as needed for severe pain. Patient not taking: Reported on 08/29/2017 02/10/17   NaNona DellPA-C  saxagliptin HCl (ONGLYZA) 5 MG TABS tablet Take by mouth daily.    [provider]  saxagliptin HCl (ONGLYZA) 5 MG TABS tablet Take by mouth daily.    [provider]    Physical Exam: Vitals:   04/17/18 1611 04/17/18 1800 04/17/18 1930 04/17/18 1936  BP:  (!) 204/102 (!) 212/92 (!) 212/92  Pulse:  (!) 101 (!) 102 100  Resp:    16  Temp:       TempSrc:      SpO2:  99% 98% 100%  Weight: 77.1 kg (170 lb)     Height: '5\' 2"'  (1.575 m)      General: Not in acute distress HEENT: has swelling in left lower face and jaw, extending into the upper neck, poor dentition, with caries in left lower and upper molars, has trismus.       Eyes: PERRL, EOMI, no scleral icterus.       ENT: No discharge from the ears and nose, no pharynx injection, no tonsillar enlargement.        Neck: No JVD, no bruit, no mass felt. Heme: No neck lymph node enlargement. Cardiac: S1/S2, RRR, No murmurs, No gallops or rubs. Respiratory: No rales, wheezing, rhonchi or rubs. GI: Soft, nondistended, nontender, no rebound pain, no organomegaly, BS present. GU: No hematuria Ext: No pitting leg edema bilaterally. 2+DP/PT pulse bilaterally. Musculoskeletal: No joint deformities, No joint redness or warmth, no limitation of ROM in  spin. Skin: No rashes.  Neuro: Alert, oriented X3, cranial nerves II-XII grossly intact, moves all extremities normally. Psych: Patient is not psychotic, no suicidal or hemocidal ideation.  Labs on Admission: I have personally reviewed following labs and imaging studies  CBC: Recent Labs  Lab 04/17/18 1620  WBC 12.7*  NEUTROABS 9.5*  HGB 11.3*  HCT 37.0  MCV 89.4  PLT 356   Basic Metabolic Panel: Recent Labs  Lab 04/17/18 1620  NA 132*  K 3.7  CL 95*  CO2 26  GLUCOSE 379*  BUN 13  CREATININE 0.75  CALCIUM 9.5   GFR: Estimated Creatinine Clearance: 71.9 mL/min (by C-G formula based on SCr of 0.75 mg/dL). Liver Function Tests: No results for input(s): AST, ALT, ALKPHOS, BILITOT, PROT, ALBUMIN in the last 168 hours. No results for input(s): LIPASE, AMYLASE in the last 168 hours. No results for input(s): AMMONIA in the last 168 hours. Coagulation Profile: No results for input(s): INR, PROTIME in the last 168 hours. Cardiac Enzymes: No results for input(s): CKTOTAL, CKMB, CKMBINDEX, TROPONINI in the last 168 hours. BNP  (last 3 results) No results for input(s): PROBNP in the last 8760 hours. HbA1C: No results for input(s): HGBA1C in the last 72 hours. CBG: No results for input(s): GLUCAP in the last 168 hours. Lipid Profile: No results for input(s): CHOL, HDL, LDLCALC, TRIG, CHOLHDL, LDLDIRECT in the last 72 hours. Thyroid Function Tests: No results for input(s): TSH, T4TOTAL, FREET4, T3FREE, THYROIDAB in the last 72 hours. Anemia Panel: No results for input(s): VITAMINB12, FOLATE, FERRITIN, TIBC, IRON, RETICCTPCT in the last 72 hours. Urine analysis: No results found for: COLORURINE, APPEARANCEUR, LABSPEC, PHURINE, GLUCOSEU, HGBUR, BILIRUBINUR, KETONESUR, PROTEINUR, UROBILINOGEN, NITRITE, LEUKOCYTESUR Sepsis Labs: '@LABRCNTIP' (procalcitonin:4,lacticidven:4) )No results found for this or any previous visit (from the past 240 hour(s)).   Radiological Exams on Admission: Ct Soft Tissue Neck W Contrast  Result Date: 04/17/2018 CLINICAL DATA:  Tooth pain and left-sided facial swelling. EXAM: CT NECK WITH CONTRAST TECHNIQUE: Multidetector CT imaging of the neck was performed using the standard protocol following the bolus administration of intravenous contrast. CONTRAST:  179m OMNIPAQUE IOHEXOL 300 MG/ML  SOLN COMPARISON:  None. FINDINGS: PHARYNX AND LARYNX: --Nasopharynx: Fossae of Rosenmuller are clear. Normal adenoid tonsils for age. --Oral cavity and oropharynx: There is a large periapical lucency at the root of tooth 18 with dehiscence of the anterior overlying cortex. There is associated inflammatory change extending into the overlying muscles and subcutaneous fat. No fluid collection. --Hypopharynx: Normal vallecula and pyriform sinuses. --Larynx: Normal epiglottis and pre-epiglottic space. Normal aryepiglottic and vocal folds. --Retropharyngeal space: No abscess, effusion or lymphadenopathy. SALIVARY GLANDS: --Parotid: No mass lesion or inflammation. No sialolithiasis or ductal dilatation. --Submandibular:  Symmetric without inflammation. No sialolithiasis or ductal dilatation. --Sublingual: Normal. No ranula or other visible lesion of the base of tongue and floor of mouth. THYROID: Normal. LYMPH NODES: No enlarged or abnormal density lymph nodes. VASCULAR: Major cervical vessels are patent. LIMITED INTRACRANIAL: Normal. VISUALIZED ORBITS: Normal. MASTOIDS AND VISUALIZED PARANASAL SINUSES: No fluid levels or advanced mucosal thickening. No mastoid effusion. SKELETON: No bony spinal canal stenosis. No lytic or blastic lesions. UPPER CHEST: Clear. OTHER: None. IMPRESSION: Left facial odontogenic cellulitis with presumed source being the periapical lucency at tooth 18, where there is dehiscence of the overlying mandibular cortex. Electronically Signed   By: KUlyses JarredM.D.   On: 04/17/2018 19:09     EKG:  Not done in ED, will get one.   Assessment/Plan Principal Problem:  Facial cellulitis Active Problems:   Diabetes mellitus without complication (HCC)   Hypertension   HLD (hyperlipidemia)   Hypertensive urgency   Sepsis ([place])   Sepsis 2/2 facial cellulitis due to dental infection: pt meets the criteria for sepsis with leukocytosis and tachycardia.  Temperature 91.1.  Lactic acid is normal.  Currently hemodynamically stable.  - will admit to tele bed as inpt - Empiric antimicrobial treatment with clindamycin IV - PRN Zofran for nausea, morphine and Percocet for pain - Blood cultures x 2  - ESR and CRP - will get Procalcitonin and trend lactic acid levels per sepsis protocol. - IVF: 500 mL of NS bolus in ED, followed by 100 cc/h (did not give full dose of 30 ml/kg due to  hypertensive urgency and normal lactic acid)  Diabetes mellitus without complication ([place]): Last A1c not record. Patient is taking Amaryl, saxagliptin at home -SSI -Check A1c  Hypertension and Hypertensive urgency: Blood pressure medications at home.  Patient states that she is taking her blood pressure is elevated to  212/92, likely due to pain. -IV hydralazine as needed -Continue home medications amlodipine, hydralazine, Prinzide, Lopressor-HCTZ -Increased dose of amlodipine from 2.5 to 10 mg daily  HLD: -lipitor   DVT ppx: SQ Lovenox Code Status: Full code Family Communication: Yes, patient's friend  at bed side Disposition Plan:  Anticipate discharge back to previous home environment Consults called:  None Admission status:  Inpatient/tele     Date of Service 04/17/2018    Ivor Costa [facility] Pager [phone]  If 7PM-7AM, please contact night-coverage [email or web address] Password [facility] 04/17/2018, 8:28 PM

## 2018-04-17 NOTE — Progress Notes (Signed)
Pt received from ED A&O x4 with L facial swelling. No complaints of pain. Family at bedside.

## 2018-04-17 NOTE — ED Notes (Signed)
Pt ambulated to the restroom independently and back without difficulty

## 2018-04-17 NOTE — ED Provider Notes (Signed)
[facility] EMERGENCY DEPARTMENT Provider Note   CSN: [id number] Arrival date & time: 04/17/18  1557     History   Chief Complaint Chief Complaint  Patient presents with  . Dental Pain    HPI Lauren Waller is a 62 y.o. female with history of hypertension, diabetes, depression who presents with a 3-day history of left-sided dental pain.  Patient has had problems with her upper and lower left molars since April and had appointment to have them extracted, however the dentist did not extract them in April due to elevated blood pressure.  She began having pain again 3 days ago.  She denies any fevers at home.  She has left-sided anterior neck pain.  She has taken Tylenol with codeine at home and using salt water rinses without significant relief.  She did take her blood pressure medication today.  She has an appointment with her PCP tomorrow to discuss her blood pressure medications.  HPI  Past Medical History:  Diagnosis Date  . Depression   . Diabetes mellitus without complication (HCC)   . HLD (hyperlipidemia)   . Hypertension     Patient Active Problem List   Diagnosis Date Noted  . HLD (hyperlipidemia) 04/17/2018  . Hypertensive urgency 04/17/2018  . Facial cellulitis 04/17/2018  . Sepsis (HCC) 04/17/2018  . Diabetes mellitus without complication (HCC)   . Hypertension   . Varicose veins of bilateral lower extremities with other complications 08/29/2017    Past Surgical History:  Procedure Laterality Date  . EYE SURGERY Bilateral    2016  . fatty tissue removal from upper back     1998  . HERNIA REPAIR    . MYOMECTOMY    . ROBOTIC ASSISTED LAPAROSCOPIC VAGINAL HYSTERECTOMY WITH FIBROID REMOVAL     2009     OB History   None      Home Medications    Prior to Admission medications   Medication Sig Start Date End Date Taking? Authorizing Provider  atenolol (TENORMIN) 100 MG tablet Take 100 mg by mouth daily. 04/02/18  Yes [provider]  benazepril-hydrochlorthiazide (LOTENSIN HCT) 20-25 MG tablet Take 1 tablet by mouth daily. 03/15/18  Yes [provider]  BIDIL 20-37.5 MG tablet Take 1 tablet by mouth 3 (three) times daily. 04/11/18  Yes [provider]  diclofenac sodium (VOLTAREN) 1 % GEL Apply topically 4 (four) times daily.   Yes [provider]  hydrALAZINE (APRESOLINE) 25 MG tablet Take 25 mg by mouth 3 (three) times daily.   Yes [provider]  amLODipine (NORVASC) 10 MG tablet Take 10 mg by mouth daily. 03/23/18   [provider]  colchicine 0.6 MG tablet Take two 0.6mg  tablets initially, then take 0.6mg  (one tablet) by mouth every 1-2 hours after until one of the following occurs: 1.  The pain is gone 2.  The maximum dose has been given (no more than 3 tabs in 3 hours or 10 tabs in 24 hours) 3.  The side effects outweight the benefits Patient not taking: Reported on 08/29/2017 02/10/17   Barrett Henle, PA-C  fluticasone [facility]) 50 MCG/ACT nasal spray Place into both nostrils daily.    [provider]  glimepiride (AMARYL) 4 MG tablet Take 4 mg by mouth daily with breakfast.    [provider]  ibuprofen (ADVIL,MOTRIN) 600 MG tablet Take 1 tablet (600 mg total) by mouth every 6 (six) hours as needed. Patient not taking: Reported on 08/29/2017 02/10/17  Barrett Henle, PA-C  lisinopril-hydrochlorothiazide (PRINZIDE,ZESTORETIC) 20-12.5 MG tablet Take 1 tablet by mouth daily.    [provider]  meloxicam (MOBIC) 15 MG tablet Take 15 mg by mouth daily.    [provider]  metoprolol-hydrochlorothiazide (LOPRESSOR HCT) 100-25 MG tablet Take 1 tablet by mouth daily.    [provider]  mometasone (NASONEX) 50 MCG/ACT nasal spray Place 2 sprays into the nose daily.    [provider]  Omega-3-acid Ethyl Esters (LOVAZA PO) Take by mouth.    [provider]  oxyCODONE-acetaminophen  (PERCOCET/ROXICET) 5-325 MG tablet Take 1 tablet by mouth every 4 (four) hours as needed for severe pain. Patient not taking: Reported on 08/29/2017 02/10/17   Barrett Henle, PA-C  saxagliptin HCl (ONGLYZA) 5 MG TABS tablet Take by mouth daily.    [provider]  saxagliptin HCl (ONGLYZA) 5 MG TABS tablet Take by mouth daily.    [provider]    Family History Family History  Problem Relation Age of Onset  . Hypertension Mother   . Pancreatic cancer Brother     Social History Social History   Tobacco Use  . Smoking status: Never Smoker  . Smokeless tobacco: Never Used  Substance Use Topics  . Alcohol use: No  . Drug use: Never     Allergies   Januvia [sitagliptin] and Metformin and related   Review of Systems Review of Systems  Constitutional: Negative for chills and fever.  HENT: Positive for dental problem. Negative for facial swelling and sore throat.   Respiratory: Negative for shortness of breath.   Cardiovascular: Negative for chest pain.  Gastrointestinal: Negative for abdominal pain, nausea and vomiting.  Genitourinary: Negative for dysuria.  Musculoskeletal: Positive for neck pain. Negative for back pain.  Skin: Negative for rash and wound.  Neurological: Negative for headaches.  Psychiatric/Behavioral: The patient is not nervous/anxious.      Physical Exam Updated Vital Signs BP (!) 174/91   Pulse 100   Temp 99.1 F (37.3 C) (Oral)   Resp 16   Ht 5\' 2"  (1.575 m)   Wt 77.1 kg (170 lb)   SpO2 99%   BMI 31.09 kg/m   Physical Exam  Constitutional: She appears well-developed and well-nourished. No distress.  HENT:  Head: Normocephalic and atraumatic.  Mouth/Throat: Oropharynx is clear and moist. There is trismus (pt cannot open her mouth past 1-2 cm between teeth) in the jaw. Abnormal dentition. Dental abscesses and dental caries present. No oropharyngeal exudate.    Significant tenderness to upper and lower molars as  indicated with this tenderness and mass noted to lower jaw extending into the neck, significantly tender  Eyes: Pupils are equal, round, and reactive to light. Conjunctivae are normal. Right eye exhibits no discharge. Left eye exhibits no discharge. No scleral icterus.  Neck: Normal range of motion. Neck supple. No thyromegaly present.  Cardiovascular: Normal rate, regular rhythm, normal heart sounds and intact distal pulses. Exam reveals no gallop and no friction rub.  No murmur heard. Pulmonary/Chest: Effort normal and breath sounds normal. No stridor. No respiratory distress. She has no wheezes. She has no rales.  Abdominal: Soft. Bowel sounds are normal. She exhibits no distension. There is no tenderness. There is no rebound and no guarding.  Musculoskeletal: She exhibits no edema.  Lymphadenopathy:    She has no cervical adenopathy.  Neurological: She is alert. Coordination normal.  Skin: Skin is warm and dry. No rash noted. She is not diaphoretic. No  pallor.  Psychiatric: She has a normal mood and affect.  Nursing note and vitals reviewed.    ED Treatments / Results  Labs (all labs ordered are listed, but only abnormal results are displayed) Labs Reviewed  BASIC METABOLIC PANEL - Abnormal; Notable for the following components:      Result Value   Sodium 132 (*)    Chloride 95 (*)    Glucose, Bld 379 (*)    All other components within normal limits  CBC WITH DIFFERENTIAL/PLATELET - Abnormal; Notable for the following components:   WBC 12.7 (*)    Hemoglobin 11.3 (*)    Neutro Abs 9.5 (*)    All other components within normal limits  CULTURE, BLOOD (ROUTINE X 2)  CULTURE, BLOOD (ROUTINE X 2)  SEDIMENTATION RATE  C-REACTIVE PROTEIN  LACTIC ACID, PLASMA  PROCALCITONIN  HEMOGLOBIN A1C  HIV ANTIBODY (ROUTINE TESTING)  BASIC METABOLIC PANEL  CBC  I-STAT CG4 LACTIC ACID, ED    EKG None  Radiology Ct Soft Tissue Neck W Contrast  Result Date: 04/17/2018 CLINICAL DATA:   Tooth pain and left-sided facial swelling. EXAM: CT NECK WITH CONTRAST TECHNIQUE: Multidetector CT imaging of the neck was performed using the standard protocol following the bolus administration of intravenous contrast. CONTRAST:  OMNIPAQUE IOHEXOL 300 MG/ML  SOLN COMPARISON:  None. FINDINGS: PHARYNX AND LARYNX: --Nasopharynx: Fossae of Rosenmuller are clear. Normal adenoid tonsils for age. --Oral cavity and oropharynx: There is a large periapical lucency at the root of tooth 18 with dehiscence of the anterior overlying cortex. There is associated inflammatory change extending into the overlying muscles and subcutaneous fat. No fluid collection. --Hypopharynx: Normal vallecula and pyriform sinuses. --Larynx: Normal epiglottis and pre-epiglottic space. Normal aryepiglottic and vocal folds. --Retropharyngeal space: No abscess, effusion or lymphadenopathy. SALIVARY GLANDS: --Parotid: No mass lesion or inflammation. No sialolithiasis or ductal dilatation. --Submandibular: Symmetric without inflammation. No sialolithiasis or ductal dilatation. --Sublingual: Normal. No ranula or other visible lesion of the base of tongue and floor of mouth. THYROID: Normal. LYMPH NODES: No enlarged or abnormal density lymph nodes. VASCULAR: Major cervical vessels are patent. LIMITED INTRACRANIAL: Normal. VISUALIZED ORBITS: Normal. MASTOIDS AND VISUALIZED PARANASAL SINUSES: No fluid levels or advanced mucosal thickening. No mastoid effusion. SKELETON: No bony spinal canal stenosis. No lytic or blastic lesions. UPPER CHEST: Clear. OTHER: None. IMPRESSION: Left facial odontogenic cellulitis with presumed source being the periapical lucency at tooth 18, where there is dehiscence of the overlying mandibular cortex. Electronically Signed   By: Deatra Robinson M.D.   On: 04/17/2018 19:09    Procedures Procedures (including critical care time)  Medications Ordered in ED Medications  amLODipine (NORVASC) tablet 10 mg (has no  administration in time range)  atorvastatin (LIPITOR) tablet 20 mg (has no administration in time range)  fluticasone (FLONASE) 50 MCG/ACT nasal spray 1 spray (has no administration in time range)  gabapentin (NEURONTIN) capsule 100 mg (has no administration in time range)  hydrALAZINE (APRESOLINE) tablet 25 mg (has no administration in time range)  ibuprofen (ADVIL,MOTRIN) tablet 400 mg (has no administration in time range)  lisinopril-hydrochlorothiazide (PRINZIDE,ZESTORETIC) 20-12.5 MG per tablet 1 tablet (has no administration in time range)  metoprolol-hydrochlorothiazide (LOPRESSOR HCT) 100-25 MG per tablet 1 tablet (has no administration in time range)  omega-3 acid ethyl esters (LOVAZA) capsule 1 g (has no administration in time range)  oxyCODONE-acetaminophen (PERCOCET/ROXICET) 5-325 MG per tablet 1 tablet (has no administration in time range)  morphine 4 MG/ML injection 2 mg (has no administration  in time range)  clindamycin (CLEOCIN) IVPB 600 mg (has no administration in time range)  0.9 %  sodium chloride infusion (has no administration in time range)  hydrALAZINE (APRESOLINE) injection 5 mg (has no administration in time range)  ondansetron (ZOFRAN) injection 4 mg (has no administration in time range)  acetaminophen (TYLENOL) tablet 650 mg (has no administration in time range)  zolpidem (AMBIEN) tablet 5 mg (has no administration in time range)  insulin aspart (novoLOG) injection 0-9 Units (has no administration in time range)  insulin aspart (novoLOG) injection 0-5 Units (has no administration in time range)  enoxaparin (LOVENOX) injection 40 mg (has no administration in time range)  polyethylene glycol (MIRALAX / GLYCOLAX) packet 17 g (has no administration in time range)  sodium chloride 0.9 % bolus 500 mL (0 mLs Intravenous Stopped 04/17/18 1912)  morphine 4 MG/ML injection 4 mg (4 mg Intravenous Given 04/17/18 1809)  iohexol (OMNIPAQUE) 300 MG/ML solution 100 mL (100 mLs  Intravenous Contrast Given 04/17/18 1836)  hydrALAZINE (APRESOLINE) injection 10 mg (10 mg Intravenous Given 04/17/18 1936)  clindamycin (CLEOCIN) IVPB 600 mg (0 mg Intravenous Stopped 04/17/18 2008)  morphine 4 MG/ML injection 4 mg (4 mg Intravenous Given 04/17/18 2038)     Initial Impression / Assessment and Plan / ED Course  I have reviewed the triage vital signs and the nursing notes.  Pertinent labs & imaging results that were available during my care of the patient were reviewed by me and considered in my medical decision making (see chart for details).  Clinical Course as of Apr 18 2123  Tue Apr 17, 2018  [id number] 62 year old female with known poor dentition and dental infection that needed extraction a few months ago.  She is complaining of increased facial swelling and elevated blood pressure.  She was seen by dentist today who referred her to the ED concerned she may have an abscess.  Patient has pretty marketed swelling over her left side of her face.  She needs to be admitted for some antibiotics and pain and blood pressure control.   [MB]    Clinical Course User Index [MB] Terrilee FilesButler, Michael C, MD    Patient with left facial odontogenic cellulitis without fluid collection presumably from tooth 18, seen on CT.  Patient has associated trismus.  She also has hypertensive urgency.  Patient given hydralazine in the ED.  Clindamycin initiated.  WBC shows 12.7.  BMP shows sodium 132, chloride 95, glucose 379.  I spoke with Dr. Clyde LundborgNiu with [facility] who will admit the patient.  I appreciate his help with the patient.  Patient also evaluated by my attending, Dr. Charm BargesButler, who guided the patient's management and agrees with plan.    Final Clinical Impressions(s) / ED Diagnoses   Final diagnoses:  Facial cellulitis  Pain, dental    ED Discharge Orders    None       Emi HolesLaw, Jamill Wetmore M, Cordelia Poche-C 04/17/18 2124    Terrilee FilesButler, Michael C, MD 04/18/18 1121

## 2018-04-17 NOTE — ED Provider Notes (Signed)
Patient placed in Quick Look pathway, seen and evaluated   Chief Complaint: Left sided dental pain and swelling since saturday  HPI:   She reports that since Saturday she had had left sided dental pain.  She has facial swelling.  She has had dental problems in the past.  No fevers, reports pain and generally not feeling well.   ROS: No fevers, no SHOB (one)  Physical Exam:   Gen: No distress  Neuro: Awake and Alert  Skin: Warm    Focused Exam: Trismus, significant facial swelling.    Initiation of care has begun. The patient has been counseled on the process, plan, and necessity for staying for the completion/evaluation, and the remainder of the medical screening examination    Norman ClayHammond, Meleana Commerford W, PA-C 04/17/18 1616    Terrilee FilesButler, Michael C, MD 04/18/18 1121

## 2018-04-17 NOTE — ED Triage Notes (Signed)
Pt c/o tooth pain and left sided facial swelling since Saturday.

## 2018-04-17 NOTE — ED Notes (Signed)
Pt taken to ct 

## 2018-04-18 LAB — GLUCOSE, CAPILLARY
GLUCOSE-CAPILLARY: 297 mg/dL — AB (ref 70–99)
GLUCOSE-CAPILLARY: 228 mg/dL — AB (ref 70–99)
Glucose-Capillary: 212 mg/dL — ABNORMAL HIGH (ref 70–99)
Glucose-Capillary: 245 mg/dL — ABNORMAL HIGH (ref 70–99)

## 2018-04-18 LAB — CBC
HCT: 31.4 % — ABNORMAL LOW (ref 36.0–46.0)
Hemoglobin: 9.9 g/dL — ABNORMAL LOW (ref 12.0–15.0)
MCH: 27.7 pg (ref 26.0–34.0)
MCHC: 31.5 g/dL (ref 30.0–36.0)
MCV: 87.7 fL (ref 78.0–100.0)
PLATELETS: 288 10*3/uL (ref 150–400)
RBC: 3.58 MIL/uL — ABNORMAL LOW (ref 3.87–5.11)
RDW: 12.3 % (ref 11.5–15.5)
WBC: 12.3 10*3/uL — AB (ref 4.0–10.5)

## 2018-04-18 LAB — BASIC METABOLIC PANEL
Anion gap: 8 (ref 5–15)
BUN: 12 mg/dL (ref 6–20)
CHLORIDE: 100 mmol/L (ref 98–111)
CO2: 27 mmol/L (ref 22–32)
CREATININE: 0.65 mg/dL (ref 0.44–1.00)
Calcium: 9 mg/dL (ref 8.9–10.3)
GFR calc Af Amer: >60 mL/min (ref >60)
GLUCOSE: 253 mg/dL — AB (ref 70–99)
Potassium: 3.8 mmol/L (ref 3.5–5.1)
SODIUM: 135 mmol/L (ref 135–145)

## 2018-04-18 LAB — HIV ANTIBODY (ROUTINE TESTING W REFLEX): HIV SCREEN 4TH GENERATION: NONREACTIVE

## 2018-04-18 LAB — HEMOGLOBIN A1C
Hgb A1c MFr Bld: 11 % — ABNORMAL HIGH (ref 4.8–5.6)
Mean Plasma Glucose: 269 mg/dL

## 2018-04-18 MED ORDER — INSULIN GLARGINE 100 UNIT/ML ~~LOC~~ SOLN
15.0000 [IU] | Freq: Every day | SUBCUTANEOUS | Status: DC
  Administered 2018-04-18 – 2018-04-20 (×3): 15 [IU] via SUBCUTANEOUS
  Filled 2018-04-18 (×4): qty 0.15

## 2018-04-18 MED ORDER — LIVING WELL WITH DIABETES BOOK
Freq: Once | Status: AC
  Administered 2018-04-18: 14:00:00
  Filled 2018-04-18: qty 1

## 2018-04-18 NOTE — Progress Notes (Addendum)
Nutrition Brief Note  Patient identified on the Malnutrition Screening Tool (MST) Report  Wt Readings from Last 15 Encounters:  04/17/18 170 lb (77.1 kg)  08/29/17 170 lb 12.8 oz (77.5 kg)  12/28/16 172 lb (78 kg)   Lauren Waller is a 62 y.o. female with medical history significant of hypertension, hyperlipidemia, diabetes mellitus, gout, depression, who presents with left-sided dental pain and left facial swelling.  Pt admitted with sepsis secondary to facial cellulitis.   Pt sleeping soundly at time of visit. Observed breakfast tray. Pt consumed 100% pf breakfast meal.   Reviewed wt hx; wt has been stable over the past year.   Nutrition-Focused physical exam completed. Findings are no fat depletion, no muscle depletion, and no edema.   Pt currently on a soft diet; RD will adjust order to dysphagia 3 for ease of intake. Soft diet is a surgical soft diet (low fiber, low fat) which is designed for patients who are admitted with acute GI conditions and/or surgeries. Dysphagia 3 diet provides easier to chew foods for pt's complaints of dental pain.   Last Hgb A1c: 11.0 (04/18/18). PTA DM medications 4 mg glimepiride.   Labs reviewed: CBGS: 212-274 (inpatient orders for glycemic control are 0-5 units, 0-9 units insulin aspart q HS, 0-9 units insulin aspart TID with meals).  Body mass index is 31.09 kg/m. Patient meets criteria for obesity, class I based on current BMI.   Current diet order is soft, patient is consuming approximately 25-100% of meals at this time. Labs and medications reviewed.   No nutrition interventions warranted at this time. If nutrition issues arise, please consult RD.   Lauren Waller, RD, LDN, CDE Pager: [phone] After hours Pager: [phone]

## 2018-04-18 NOTE — Progress Notes (Addendum)
Inpatient Diabetes Program Recommendations  AACE/ADA: New Consensus Statement on Inpatient Glycemic Control (2015)  Target Ranges:  Prepandial:   less than 140 mg/dL      Peak postprandial:   less than 180 mg/dL (1-2 hours)      Critically ill patients:  140 - 180 mg/dL   Lab Results  Component Value Date   GLUCAP 297 (H) 04/18/2018   HGBA1C 11.0 (H) 04/18/2018    Review of Glycemic ControlResults for Lauren Waller, [name] (MRN [id number]) as of 04/18/2018 13:12  Ref. Range 04/17/2018 22:50 04/18/2018 07:40 04/18/2018 12:27  Glucose-Capillary Latest Ref Range: 70 - 99 mg/dL 409274 (H) 811212 (H) 914297 (H)    Diabetes history: Type 2 DM  Outpatient Diabetes medications: Amaryl 4 mg daily, Onglyza 5 mg daily- Neither is listed in medication reconciliation Current orders for Inpatient glycemic control:  Novolog sensitive tid with meals and HS  Inpatient Diabetes Program Recommendations:    Please add Lantus 15 units daily (0.2 units/kg) while in the hospital.  A1C indicates that patient likely needs insulin at d/c.  Will see patient regarding A1C and DM management. Text page sent to MD.   Thanks, Beryl MeagerJenny Rajanae Mantia, RN, BC-ADM Inpatient Diabetes Coordinator Pager [phone] (8a-5p)  1430:  Spoke with patient regarding elevated A1C.  She states that she has been eating "lots of chocolate and drinking mango juice".  She has stopped taking her DM medications b/c she is tired of taking so many medications.  We discussed the implications of not taking her DM medications and having increased blood sugars.  I explained that high blood sugars can contribute to infection and impaired healing.  She verbalized understanding but also prayed against complications.  We briefly discussed meal planning including the plate method.  We discussed avoiding juices and sweetened beverages.  When I brought up insulin she states "I don't want to take insulin".  She states that she "thinks she can do it with diet".  When we  discussed food, she did note that she struggles financially to buy healthy meals/foods.  We discussed fresh vegetables and the potential of shopping at lower cost grocery stores such as [employer].  Upon leaving patient's room, she stated that she was once on insulin and "she did not like it".  She does not want to take it again. Patient does not seem to completely grasp the effects of uncontrolled DM.

## 2018-04-18 NOTE — Progress Notes (Signed)
PROGRESS NOTE    Lauren Waller  GMW:[id number] DOB: 13-Nov-1955 DOA: 04/17/2018 PCP: Jackie Plumsei-Bonsu, George, MD     Brief Narrative:  Lauren Waller is a 62 y.o. female with medical history significant of hypertension, hyperlipidemia, diabetes mellitus, gout, depression, who presents with left-sided dental pain and left facial swelling. Pt states that she has dental problem in her left upper and lower molars since 01/2018. She was seen by dentist, but was unable to extract themin April due to elevated blood pressure. Her dental pain has worsened in the past 4 days. She developed left facial swelling. She has trismus and has difficulty opening her mouth due to pain.  Pain is constant, sharp, severe, aggravated by chewing. She had mild subjective fever, but no chills. The pain is spreading down to her left sided anterior neck. No difficulty breathing. She was seen by dentist today who suspect pt may have abscess and sent to ER for further evaluation and treatment. CT neck showed left facial odontogenic cellulitis with presumed source being the periapical lucency at tooth 18, where there is dehiscence of the overlying mandibular cortex. She was admitted and started in IV clindamycin.   New events last 24 hours / Subjective: Patient sitting in bed eating breakfast.  She states that the pain of her left face and in her mouth has improved.  She has no new complaints.  No nausea or vomiting.  Assessment & Plan:   Principal Problem:   Facial cellulitis Active Problems:   Diabetes mellitus without complication (HCC)   Hypertension   HLD (hyperlipidemia)   Hypertensive urgency   Sepsis (HCC)   Sepsis 2/2 facial cellulitis due to dental infection -CT soft tissue neck: Left facial odontogenic cellulitis with presumed source being the periapical lucency at tooth 18, where there is dehiscence of the overlying mandibular cortex.  -We do not have inpatient dental coverage currently. Patient to  follow up with Dr. Hilda BladesArmstrong (dentist in [place]) on discharge -Blood culture pending  -Continue to have fever 101.4 last night, WBC 12.3  -Continue clindamycin IV  Diabetes mellitus, uncontrolled with hyperglycemia -HA1c 11 -Lantus, SSI -Appreciate DM coordinator assistance   Hypertension  -Continue home medications amlodipine, tenormin, hydralazine, lotensin-HCTZ -BP stable this morning   HLD -Continue lipitor   DVT prophylaxis: Lovenox Code Status: Full Family Communication: Family at bedside Disposition Plan: Pending improvement, anticipate discharge back home   Consultants:   None  Procedures:   None  Antimicrobials:  Anti-infectives (From admission, onward)   Start     Dose/Rate Route Frequency Ordered Stop   04/18/18 0400  clindamycin (CLEOCIN) IVPB 600 mg     600 mg 100 mL/hr over 30 Minutes Intravenous Every 8 hours 04/17/18 2003     04/17/18 1930  clindamycin (CLEOCIN) IVPB 600 mg     600 mg 100 mL/hr over 30 Minutes Intravenous  Once 04/17/18 1926 04/17/18 2008       Objective: Vitals:   04/17/18 2048 04/17/18 2100 04/17/18 2126 04/18/18 0558  BP: (!) 165/83 (!) 174/91 (!) 160/78 112/83  Pulse: 99 100 98 67  Resp: 20 16 16 17   Temp:   (!) 101.4 F (38.6 C) 98.5 F (36.9 C)  TempSrc:   Oral Oral  SpO2: 98% 99% 98% 98%  Weight:      Height:        Intake/Output Summary (Last 24 hours) at 04/18/2018 1329 Last data filed at 04/18/2018 0900 Gross per 24 hour  Intake 2279.71 ml  Output -  Net  2279.71 ml   Filed Weights   04/17/18 1611  Weight: 77.1 kg (170 lb)    Examination:  General exam: Appears calm and comfortable  Respiratory system: Clear to auscultation. Respiratory effort normal. Cardiovascular system: S1 & S2 heard, RRR. No JVD, murmurs, rubs, gallops or clicks. No pedal edema. Gastrointestinal system: Abdomen is nondistended, soft and nontender. No organomegaly or masses felt. Normal bowel sounds heard. Central nervous  system: Alert and oriented. No focal neurological deficits. Extremities: Symmetric 5 x 5 power. Skin: Left cheek and neck with edema, patient with difficulty opening mouth fully due to pain  Psychiatry: Judgement and insight appear normal. Mood & affect appropriate.   Data Reviewed: I have personally reviewed following labs and imaging studies  CBC: Recent Labs  Lab 04/17/18 1620 04/18/18 0449  WBC 12.7* 12.3*  NEUTROABS 9.5*  --   HGB 11.3* 9.9*  HCT 37.0 31.4*  MCV 89.4 87.7  PLT 330 288   Basic Metabolic Panel: Recent Labs  Lab 04/17/18 1620 04/18/18 0449  NA 132* 135  K 3.7 3.8  CL 95* 100  CO2 26 27  GLUCOSE 379* 253*  BUN 13 12  CREATININE 0.75 0.65  CALCIUM 9.5 9.0   GFR: Estimated Creatinine Clearance: 71.9 mL/min (by C-G formula based on SCr of 0.65 mg/dL). Liver Function Tests: No results for input(s): AST, ALT, ALKPHOS, BILITOT, PROT, ALBUMIN in the last 168 hours. No results for input(s): LIPASE, AMYLASE in the last 168 hours. No results for input(s): AMMONIA in the last 168 hours. Coagulation Profile: No results for input(s): INR, PROTIME in the last 168 hours. Cardiac Enzymes: No results for input(s): CKTOTAL, CKMB, CKMBINDEX, TROPONINI in the last 168 hours. BNP (last 3 results) No results for input(s): PROBNP in the last 8760 hours. HbA1C: Recent Labs    04/18/18 0449  HGBA1C 11.0*   CBG: Recent Labs  Lab 04/17/18 2250 04/18/18 0740 04/18/18 1227  GLUCAP 274* 212* 297*   Lipid Profile: No results for input(s): CHOL, HDL, LDLCALC, TRIG, CHOLHDL, LDLDIRECT in the last 72 hours. Thyroid Function Tests: No results for input(s): TSH, T4TOTAL, FREET4, T3FREE, THYROIDAB in the last 72 hours. Anemia Panel: No results for input(s): VITAMINB12, FOLATE, FERRITIN, TIBC, IRON, RETICCTPCT in the last 72 hours. Sepsis Labs: Recent Labs  Lab 04/17/18 1635 04/17/18 2039  PROCALCITON  --  <0.10  LATICACIDVEN 1.27 1.4    No results found for this  or any previous visit (from the past 240 hour(s)).     Radiology Studies: Ct Soft Tissue Neck W Contrast  Result Date: 04/17/2018 CLINICAL DATA:  Tooth pain and left-sided facial swelling. EXAM: CT NECK WITH CONTRAST TECHNIQUE: Multidetector CT imaging of the neck was performed using the standard protocol following the bolus administration of intravenous contrast. CONTRAST:  OMNIPAQUE IOHEXOL 300 MG/ML  SOLN COMPARISON:  None. FINDINGS: PHARYNX AND LARYNX: --Nasopharynx: Fossae of Rosenmuller are clear. Normal adenoid tonsils for age. --Oral cavity and oropharynx: There is a large periapical lucency at the root of tooth 18 with dehiscence of the anterior overlying cortex. There is associated inflammatory change extending into the overlying muscles and subcutaneous fat. No fluid collection. --Hypopharynx: Normal vallecula and pyriform sinuses. --Larynx: Normal epiglottis and pre-epiglottic space. Normal aryepiglottic and vocal folds. --Retropharyngeal space: No abscess, effusion or lymphadenopathy. SALIVARY GLANDS: --Parotid: No mass lesion or inflammation. No sialolithiasis or ductal dilatation. --Submandibular: Symmetric without inflammation. No sialolithiasis or ductal dilatation. --Sublingual: Normal. No ranula or other visible lesion of the base of  tongue and floor of mouth. THYROID: Normal. LYMPH NODES: No enlarged or abnormal density lymph nodes. VASCULAR: Major cervical vessels are patent. LIMITED INTRACRANIAL: Normal. VISUALIZED ORBITS: Normal. MASTOIDS AND VISUALIZED PARANASAL SINUSES: No fluid levels or advanced mucosal thickening. No mastoid effusion. SKELETON: No bony spinal canal stenosis. No lytic or blastic lesions. UPPER CHEST: Clear. OTHER: None. IMPRESSION: Left facial odontogenic cellulitis with presumed source being the periapical lucency at tooth 18, where there is dehiscence of the overlying mandibular cortex. Electronically Signed   By: Deatra Robinson M.D.   On: 04/17/2018 19:09       Scheduled Meds: . amLODipine  10 mg Oral Daily  . atenolol  100 mg Oral Daily  . atorvastatin  20 mg Oral q1800  . benazepril  20 mg Oral Daily   And  . hydrochlorothiazide  25 mg Oral Daily  . enoxaparin (LOVENOX) injection  40 mg Subcutaneous Q24H  . fluticasone  1 spray Each Nare Daily  . gabapentin  100 mg Oral TID  . hydrALAZINE  25 mg Oral TID  . insulin aspart  0-5 Units Subcutaneous QHS  . insulin aspart  0-9 Units Subcutaneous TID WC  . insulin glargine  15 Units Subcutaneous QHS  . living well with diabetes book   Does not apply Once  . omega-3 acid ethyl esters  1 g Oral Daily   Continuous Infusions: . sodium chloride 100 mL/hr at 04/18/18 0805  . clindamycin (CLEOCIN) IV 600 mg (04/18/18 1142)     LOS: 1 day    Time spent: 35 minutes   Noralee Stain, DO [facility] [email or web address] Password [facility] 04/18/2018, 1:29 PM

## 2018-04-19 LAB — BASIC METABOLIC PANEL
ANION GAP: 10 (ref 5–15)
BUN: 15 mg/dL (ref 6–20)
CHLORIDE: 103 mmol/L (ref 98–111)
CO2: 25 mmol/L (ref 22–32)
Calcium: 8.9 mg/dL (ref 8.9–10.3)
Creatinine, Ser: 0.75 mg/dL (ref 0.44–1.00)
GFR calc Af Amer: >60 mL/min (ref >60)
Glucose, Bld: 123 mg/dL — ABNORMAL HIGH (ref 70–99)
POTASSIUM: 3.7 mmol/L (ref 3.5–5.1)
Sodium: 138 mmol/L (ref 135–145)

## 2018-04-19 LAB — CBC
HEMATOCRIT: 30.7 % — AB (ref 36.0–46.0)
Hemoglobin: 9.5 g/dL — ABNORMAL LOW (ref 12.0–15.0)
MCH: 27.5 pg (ref 26.0–34.0)
MCHC: 30.9 g/dL (ref 30.0–36.0)
MCV: 89 fL (ref 78.0–100.0)
PLATELETS: 272 10*3/uL (ref 150–400)
RBC: 3.45 MIL/uL — ABNORMAL LOW (ref 3.87–5.11)
RDW: 12.4 % (ref 11.5–15.5)
WBC: 7.7 10*3/uL (ref 4.0–10.5)

## 2018-04-19 LAB — GLUCOSE, CAPILLARY
GLUCOSE-CAPILLARY: 117 mg/dL — AB (ref 70–99)
GLUCOSE-CAPILLARY: 224 mg/dL — AB (ref 70–99)
GLUCOSE-CAPILLARY: 277 mg/dL — AB (ref 70–99)
Glucose-Capillary: 251 mg/dL — ABNORMAL HIGH (ref 70–99)

## 2018-04-19 NOTE — Progress Notes (Signed)
PROGRESS NOTE    Lauren Waller  ZOX:[id number] DOB: 06/11/1956 DOA: 04/17/2018 PCP: Jackie Plum, MD     Brief Narrative:  Lauren Waller is a 62 y.o. female with medical history significant of hypertension, hyperlipidemia, diabetes mellitus, gout, depression, who presents with left-sided dental pain and left facial swelling. Pt states that she has dental problem in her left upper and lower molars since 01/2018. She was seen by dentist, but was unable to extract themin April due to elevated blood pressure. Her dental pain has worsened in the past 4 days. She developed left facial swelling. She has trismus and has difficulty opening her mouth due to pain.  Pain is constant, sharp, severe, aggravated by chewing. She had mild subjective fever, but no chills. The pain is spreading down to her left sided anterior neck. No difficulty breathing. She was seen by dentist today who suspect pt may have abscess and sent to ER for further evaluation and treatment. CT neck showed left facial odontogenic cellulitis with presumed source being the periapical lucency at tooth 18, where there is dehiscence of the overlying mandibular cortex. She was admitted and started in IV clindamycin.   New events last 24 hours / Subjective: Patient sitting in chair. No new complaints this morning, doing well overall. She was able to eat some solid food today. She continues to have significant pain overlying left cheek.   Assessment & Plan:   Principal Problem:   Facial cellulitis Active Problems:   Diabetes mellitus without complication (HCC)   Hypertension   HLD (hyperlipidemia)   Hypertensive urgency   Sepsis (HCC)   Sepsis 2/2 facial cellulitis due to dental infection -CT soft tissue neck: Left facial odontogenic cellulitis with presumed source being the periapical lucency at tooth 18, where there is dehiscence of the overlying mandibular cortex.  -We do not have inpatient dental coverage  currently. Patient to follow up with Dr. Hilda Blades (dentist in [place]) on discharge -Blood culture negative to date  -Afebrile overnight, WBC 7.7 this morning  -Continue clindamycin IV one more day, suspect we can deescalate to PO tomorrow and discharge home as long as she has clinical improvement  Diabetes mellitus, uncontrolled with hyperglycemia -HA1c 11 -Lantus, SSI -Appreciate DM coordinator assistance   Hypertension  -Continue home medications amlodipine, tenormin, hydralazine, lotensin-HCTZ  HLD -Continue lipitor   DVT prophylaxis: Lovenox Code Status: Full Family Communication: No family at bedside Disposition Plan: Pending improvement, anticipate discharge back home   Consultants:   None  Procedures:   None  Antimicrobials:  Anti-infectives (From admission, onward)   Start     Dose/Rate Route Frequency Ordered Stop   04/18/18 0400  clindamycin (CLEOCIN) IVPB 600 mg     600 mg 100 mL/hr over 30 Minutes Intravenous Every 8 hours 04/17/18 2003     04/17/18 1930  clindamycin (CLEOCIN) IVPB 600 mg     600 mg 100 mL/hr over 30 Minutes Intravenous  Once 04/17/18 1926 04/17/18 2008       Objective: Vitals:   04/18/18 1444 04/18/18 2121 04/18/18 2258 04/19/18 0516  BP: 124/62 (!) 165/79  (!) 150/75  Pulse: 69 73  66  Resp: 20   (!) 21  Temp: 99.6 F (37.6 C) 99.9 F (37.7 C) 98.8 F (37.1 C) 99.1 F (37.3 C)  TempSrc: Oral Oral Oral Oral  SpO2: 100% 99%  100%  Weight:      Height:        Intake/Output Summary (Last 24 hours) at 04/19/2018 1151 Last  data filed at 04/19/2018 0930 Gross per 24 hour  Intake 3255.11 ml  Output -  Net 3255.11 ml   Filed Weights   04/17/18 1611  Weight: 77.1 kg (170 lb)    Examination: General exam: Appears calm and comfortable  Respiratory system: Clear to auscultation. Respiratory effort normal. Cardiovascular system: S1 & S2 heard, RRR. No JVD, murmurs, rubs, gallops or clicks. No pedal  edema. Gastrointestinal system: Abdomen is nondistended, soft and nontender. No organomegaly or masses felt. Normal bowel sounds heard. Central nervous system: Alert and oriented. No focal neurological deficits. Extremities: Symmetric 5 x 5 power. Skin: Left cheek with edema, painful to light palpation  Psychiatry: Judgement and insight appear normal. Mood & affect appropriate.   Data Reviewed: I have personally reviewed following labs and imaging studies  CBC: Recent Labs  Lab 04/17/18 1620 04/18/18 0449 04/19/18 0607  WBC 12.7* 12.3* 7.7  NEUTROABS 9.5*  --   --   HGB 11.3* 9.9* 9.5*  HCT 37.0 31.4* 30.7*  MCV 89.4 87.7 89.0  PLT 330 288 272   Basic Metabolic Panel: Recent Labs  Lab 04/17/18 1620 04/18/18 0449 04/19/18 0607  NA 132* 135 138  K 3.7 3.8 3.7  CL 95* 100 103  CO2 26 27 25   GLUCOSE 379* 253* 123*  BUN 13 12 15   CREATININE 0.75 0.65 0.75  CALCIUM 9.5 9.0 8.9   GFR: Estimated Creatinine Clearance: 71.9 mL/min (by C-G formula based on SCr of 0.75 mg/dL). Liver Function Tests: No results for input(s): AST, ALT, ALKPHOS, BILITOT, PROT, ALBUMIN in the last 168 hours. No results for input(s): LIPASE, AMYLASE in the last 168 hours. No results for input(s): AMMONIA in the last 168 hours. Coagulation Profile: No results for input(s): INR, PROTIME in the last 168 hours. Cardiac Enzymes: No results for input(s): CKTOTAL, CKMB, CKMBINDEX, TROPONINI in the last 168 hours. BNP (last 3 results) No results for input(s): PROBNP in the last 8760 hours. HbA1C: Recent Labs    04/18/18 0449  HGBA1C 11.0*   CBG: Recent Labs  Lab 04/18/18 0740 04/18/18 1227 04/18/18 1649 04/18/18 2128 04/19/18 0828  GLUCAP 212* 297* 245* 228* 117*   Lipid Profile: No results for input(s): CHOL, HDL, LDLCALC, TRIG, CHOLHDL, LDLDIRECT in the last 72 hours. Thyroid Function Tests: No results for input(s): TSH, T4TOTAL, FREET4, T3FREE, THYROIDAB in the last 72 hours. Anemia  Panel: No results for input(s): VITAMINB12, FOLATE, FERRITIN, TIBC, IRON, RETICCTPCT in the last 72 hours. Sepsis Labs: Recent Labs  Lab 04/17/18 1635 04/17/18 2039  PROCALCITON  --  <0.10  LATICACIDVEN 1.27 1.4    Recent Results (from the past 240 hour(s))  Culture, blood (x 2)     Status: None (Preliminary result)   Collection Time: 04/17/18  8:41 PM  Result Value Ref Range Status   Specimen Description BLOOD LEFT ANTECUBITAL  Final   Special Requests   Final    BOTTLES DRAWN AEROBIC AND ANAEROBIC Blood Culture adequate volume   Culture   Final    NO GROWTH 2 DAYS Performed at [facility], [address] [place], [place] [address]    Report Status PENDING  Incomplete  Culture, blood (x 2)     Status: None (Preliminary result)   Collection Time: 04/17/18  8:46 PM  Result Value Ref Range Status   Specimen Description BLOOD RIGHT ANTECUBITAL  Final   Special Requests   Final    BOTTLES DRAWN AEROBIC AND ANAEROBIC Blood Culture adequate volume  Culture   Final    NO GROWTH 2 DAYS Performed at [facility], [address] [place], [place] [address]    Report Status PENDING  Incomplete       Radiology Studies: Ct Soft Tissue Neck W Contrast  Result Date: 04/17/2018 CLINICAL DATA:  Tooth pain and left-sided facial swelling. EXAM: CT NECK WITH CONTRAST TECHNIQUE: Multidetector CT imaging of the neck was performed using the standard protocol following the bolus administration of intravenous contrast. CONTRAST:  100mL OMNIPAQUE IOHEXOL 300 MG/ML  SOLN COMPARISON:  None. FINDINGS: PHARYNX AND LARYNX: --Nasopharynx: Fossae of Rosenmuller are clear. Normal adenoid tonsils for age. --Oral cavity and oropharynx: There is a large periapical lucency at the root of tooth 18 with dehiscence of the anterior overlying cortex. There is associated inflammatory change extending into the overlying muscles and subcutaneous fat. No fluid collection. --Hypopharynx: Normal vallecula and  pyriform sinuses. --Larynx: Normal epiglottis and pre-epiglottic space. Normal aryepiglottic and vocal folds. --Retropharyngeal space: No abscess, effusion or lymphadenopathy. SALIVARY GLANDS: --Parotid: No mass lesion or inflammation. No sialolithiasis or ductal dilatation. --Submandibular: Symmetric without inflammation. No sialolithiasis or ductal dilatation. --Sublingual: Normal. No ranula or other visible lesion of the base of tongue and floor of mouth. THYROID: Normal. LYMPH NODES: No enlarged or abnormal density lymph nodes. VASCULAR: Major cervical vessels are patent. LIMITED INTRACRANIAL: Normal. VISUALIZED ORBITS: Normal. MASTOIDS AND VISUALIZED PARANASAL SINUSES: No fluid levels or advanced mucosal thickening. No mastoid effusion. SKELETON: No bony spinal canal stenosis. No lytic or blastic lesions. UPPER CHEST: Clear. OTHER: None. IMPRESSION: Left facial odontogenic cellulitis with presumed source being the periapical lucency at tooth 18, where there is dehiscence of the overlying mandibular cortex. Electronically Signed   By: Deatra RobinsonKevin  Herman M.D.   On: 04/17/2018 19:09      Scheduled Meds: . amLODipine  10 mg Oral Daily  . atenolol  100 mg Oral Daily  . atorvastatin  20 mg Oral q1800  . benazepril  20 mg Oral Daily   And  . hydrochlorothiazide  25 mg Oral Daily  . enoxaparin (LOVENOX) injection  40 mg Subcutaneous Q24H  . fluticasone  1 spray Each Nare Daily  . hydrALAZINE  25 mg Oral TID  . insulin aspart  0-5 Units Subcutaneous QHS  . insulin aspart  0-9 Units Subcutaneous TID WC  . insulin glargine  15 Units Subcutaneous QHS  . omega-3 acid ethyl esters  1 g Oral Daily   Continuous Infusions: . sodium chloride 100 mL/hr at 04/19/18 0943  . clindamycin (CLEOCIN) IV Stopped (04/19/18 0423)     LOS: 2 days    Time spent: 20 minutes   Noralee StainJennifer Lorretta Kerce, DO [facility] [email or web address] Password [email or web address] 04/19/2018, 11:51 AM

## 2018-04-20 DIAGNOSIS — L03211 Cellulitis of face: Secondary | ICD-10-CM

## 2018-04-20 DIAGNOSIS — I10 Essential (primary) hypertension: Secondary | ICD-10-CM

## 2018-04-20 DIAGNOSIS — E119 Type 2 diabetes mellitus without complications: Secondary | ICD-10-CM

## 2018-04-20 LAB — BASIC METABOLIC PANEL
ANION GAP: 8 (ref 5–15)
BUN: 11 mg/dL (ref 6–20)
CALCIUM: 8.9 mg/dL (ref 8.9–10.3)
CHLORIDE: 105 mmol/L (ref 98–111)
CO2: 26 mmol/L (ref 22–32)
Creatinine, Ser: 0.65 mg/dL (ref 0.44–1.00)
GFR calc Af Amer: >60 mL/min (ref >60)
GFR calc non Af Amer: >60 mL/min (ref >60)
Glucose, Bld: 92 mg/dL (ref 70–99)
POTASSIUM: 3.2 mmol/L — AB (ref 3.5–5.1)
Sodium: 139 mmol/L (ref 135–145)

## 2018-04-20 LAB — GLUCOSE, CAPILLARY
GLUCOSE-CAPILLARY: 96 mg/dL (ref 70–99)
GLUCOSE-CAPILLARY: 229 mg/dL — AB (ref 70–99)
Glucose-Capillary: 228 mg/dL — ABNORMAL HIGH (ref 70–99)
Glucose-Capillary: 284 mg/dL — ABNORMAL HIGH (ref 70–99)

## 2018-04-20 LAB — CBC
HEMATOCRIT: 29.7 % — AB (ref 36.0–46.0)
HEMOGLOBIN: 9.3 g/dL — AB (ref 12.0–15.0)
MCH: 27.4 pg (ref 26.0–34.0)
MCHC: 31.3 g/dL (ref 30.0–36.0)
MCV: 87.6 fL (ref 78.0–100.0)
Platelets: 280 10*3/uL (ref 150–400)
RBC: 3.39 MIL/uL — ABNORMAL LOW (ref 3.87–5.11)
RDW: 12.3 % (ref 11.5–15.5)
WBC: 7.3 10*3/uL (ref 4.0–10.5)

## 2018-04-20 MED ORDER — SODIUM CHLORIDE 0.9 % IV SOLN
INTRAVENOUS | Status: DC | PRN
  Administered 2018-04-20: 04:00:00 via INTRAVENOUS

## 2018-04-20 NOTE — Progress Notes (Addendum)
Inpatient Diabetes Program Recommendations  AACE/ADA: New Consensus Statement on Inpatient Glycemic Control (2015)  Target Ranges:  Prepandial:   less than 140 mg/dL      Peak postprandial:   less than 180 mg/dL (1-2 hours)      Critically ill patients:  140 - 180 mg/dL   Lab Results  Component Value Date   GLUCAP 228 (H) 04/20/2018   HGBA1C 11.0 (H) 04/18/2018    Review of Glycemic ControlResults for Felicity PellegriniCREESE-JOSLYN, Walida (MRN [id number]) as of 04/20/2018 16:00  Ref. Range 04/19/2018 12:23 04/19/2018 17:07 04/19/2018 21:19 04/20/2018 08:15 04/20/2018 12:23  Glucose-Capillary Latest Ref Range: 70 - 99 mg/dL 469251 (H) 629224 (H) 528277 (H) 96 228 (H)  Diabetes history: Type 2 DM  Outpatient Diabetes medications: Amaryl 4 mg daily, Onglyza 5 mg daily- Neither is listed in medication reconciliation Current orders for Inpatient glycemic control:  Novolog sensitive tid with meals and HS, Lantus 15 units  Inpatient Diabetes Program Recommendations:    Note that blood sugars improved with Lantus.  May also benefit from Amaryl for meal coverage at home. Discussed with RN by phone.  Patient stated on 6/26 that she did not want to take insulin but has taken it in the past.  Asked RN to please review insulin administration with patient and ask her if she has glucose meter for home.    Thanks,  Beryl MeagerJenny Serafin [place], RN, BC-ADM Inpatient Diabetes Coordinator Pager [phone] (8a-5p)

## 2018-04-20 NOTE — Progress Notes (Signed)
PROGRESS NOTE  Lauren Waller VFI:[id number] DOB: [birth year] DOA: 04/17/2018 PCP: Jackie Plum, MD   LOS: 3 days   Brief Narrative / Interim history: Lauren Waller is a 62 y.o.femalewith medical history significant ofhypertension, hyperlipidemia, diabetes mellitus, gout, depression, who presents with left-sided dental pain and left facial swelling. Pt states that she has dental problem in her left upper and lower molars since 01/2018. She was seen by dentist, but was unable toextract themin April due to elevated blood pressure.Her dental pain hasworsened in the past 4 days.She developed left facial swelling.She has trismus and has difficulty opening her mouth due to pain.Pain is constant, sharp, severe, aggravated by chewing. She had mild subjective fever, but no chills. The pain is spreading down to her left sidedanterior neck. Nodifficulty breathing. She was seen by dentist today who suspect pt may have abscess and sent to ER forfurther evaluation and treatment. CT neck showed left facial odontogenic cellulitis with presumed source being the periapical lucency at tooth 18, where there is dehiscence of the overlying mandibular cortex. She was admitted and started in IV clindamycin.  Slowly improving, swelling is coming down however still was febrile to one 1.3 last night.  Assessment & Plan: Principal Problem:   Facial cellulitis Active Problems:   Diabetes mellitus without complication (HCC)   Hypertension   HLD (hyperlipidemia)   Hypertensive urgency   Sepsis (HCC)   Sepsis due to facial cellulitis due to dental infection -On admission she underwent a CT scan which showed left facial odontogenic cellulitis with presumed source being the periapical lucency at tooth 18, where there is dehiscence of the overlying mandibular cortex -There is no formal inpatient dental coverage, her primary dentist is Dr. Hilda Blades.  I placed a call to Dr. Chales Salmon to discuss case  and see if he has further recommendations at this point -Blood cultures negative to date -She has been started on IV clindamycin, continue, she was febrile to 101.3 last night with subjective chills as well as feeling of the left side of her face "burning up".  Type 2 diabetes mellitus, uncontrolled with hyperglycemia -Most recent hemoglobin A1c is 11 -Apparently at home she is on Amaryl and Onglyza, these are not listed in the medication reconciliation.  She was started on insulin with Lantus and sliding scale, CBG is better, continue current regimen  Hypertension -Continue home medications, blood pressure fairly well controlled  Hyperlipidemia -Continue Lipitor   DVT prophylaxis: Lovenox Code Status: Full code Family Communication: No family present at bedside Disposition Plan: Home once afebrile for 24 hours  Consultants:   None  Procedures:   None   Antimicrobials:  Clindamycin 6/26  Subjective: She states that her left-sided jaw pain is improving, she is able to open her mouth a little bit more today.  No chest pain, no shortness of breath.  Tolerating diet well.  Objective: Vitals:   04/19/18 2122 04/20/18 0534 04/20/18 0947 04/20/18 1339  BP: (!) 171/76 (!) 141/69 (!) 156/86 (!) 154/73  Pulse: 72 (!) 57 61 (!) 56  Resp: 16 17  14   Temp: (!) 101.3 F (38.5 C) 97.8 F (36.6 C)  98.5 F (36.9 C)  TempSrc: Oral Oral  Oral  SpO2: 100% 98%  99%  Weight:      Height:        Intake/Output Summary (Last 24 hours) at 04/20/2018 1432 Last data filed at 04/20/2018 1411 Gross per 24 hour  Intake 1160.23 ml  Output -  Net 1160.23 ml   Filed  Weights   04/17/18 1611  Weight: 77.1 kg (170 lb)    Examination:  Constitutional: NAD Eyes: lids and conjunctivae normal ENMT: Mucous membranes are moist.  Left lower face swollen, very tender to palpation.  Patient cannot fully open her mouth Respiratory: clear to auscultation bilaterally, no wheezing, no  crackles. Cardiovascular: Regular rate and rhythm, no murmurs / rubs / gallops.  Skin: no rashes, lesions, ulcers. No induration Neurologic: non focal   Data Reviewed: I have independently reviewed following labs and imaging studies   CBC: Recent Labs  Lab 04/17/18 1620 04/18/18 0449 04/19/18 0607 04/20/18 0704  WBC 12.7* 12.3* 7.7 7.3  NEUTROABS 9.5*  --   --   --   HGB 11.3* 9.9* 9.5* 9.3*  HCT 37.0 31.4* 30.7* 29.7*  MCV 89.4 87.7 89.0 87.6  PLT 330 288 272 280   Basic Metabolic Panel: Recent Labs  Lab 04/17/18 1620 04/18/18 0449 04/19/18 0607 04/20/18 0704  NA 132* 135 138 139  K 3.7 3.8 3.7 3.2*  CL 95* 100 103 105  CO2 26 27 25 26   GLUCOSE 379* 253* 123* 92  BUN 13 12 15 11   CREATININE 0.75 0.65 0.75 0.65  CALCIUM 9.5 9.0 8.9 8.9   GFR: Estimated Creatinine Clearance: 71.9 mL/min (by C-G formula based on SCr of 0.65 mg/dL). Liver Function Tests: No results for input(s): AST, ALT, ALKPHOS, BILITOT, PROT, ALBUMIN in the last 168 hours. No results for input(s): LIPASE, AMYLASE in the last 168 hours. No results for input(s): AMMONIA in the last 168 hours. Coagulation Profile: No results for input(s): INR, PROTIME in the last 168 hours. Cardiac Enzymes: No results for input(s): CKTOTAL, CKMB, CKMBINDEX, TROPONINI in the last 168 hours. BNP (last 3 results) No results for input(s): PROBNP in the last 8760 hours. HbA1C: Recent Labs    04/18/18 0449  HGBA1C 11.0*   CBG: Recent Labs  Lab 04/19/18 1223 04/19/18 1707 04/19/18 2119 04/20/18 0815 04/20/18 1223  GLUCAP 251* 224* 277* 96 228*   Lipid Profile: No results for input(s): CHOL, HDL, LDLCALC, TRIG, CHOLHDL, LDLDIRECT in the last 72 hours. Thyroid Function Tests: No results for input(s): TSH, T4TOTAL, FREET4, T3FREE, THYROIDAB in the last 72 hours. Anemia Panel: No results for input(s): VITAMINB12, FOLATE, FERRITIN, TIBC, IRON, RETICCTPCT in the last 72 hours. Urine analysis: No results found  for: COLORURINE, APPEARANCEUR, LABSPEC, PHURINE, GLUCOSEU, HGBUR, BILIRUBINUR, KETONESUR, PROTEINUR, UROBILINOGEN, NITRITE, LEUKOCYTESUR Sepsis Labs: Invalid input(s): PROCALCITONIN, LACTICIDVEN  Recent Results (from the past 240 hour(s))  Culture, blood (x 2)     Status: None (Preliminary result)   Collection Time: 04/17/18  8:41 PM  Result Value Ref Range Status   Specimen Description BLOOD LEFT ANTECUBITAL  Final   Special Requests   Final    BOTTLES DRAWN AEROBIC AND ANAEROBIC Blood Culture adequate volume   Culture   Final    NO GROWTH 3 DAYS Performed at [facility], [address], [place], [place] [address]    Report Status PENDING  Incomplete  Culture, blood (x 2)     Status: None (Preliminary result)   Collection Time: 04/17/18  8:46 PM  Result Value Ref Range Status   Specimen Description BLOOD RIGHT ANTECUBITAL  Final   Special Requests   Final    BOTTLES DRAWN AEROBIC AND ANAEROBIC Blood Culture adequate volume   Culture   Final    NO GROWTH 3 DAYS Performed at [facility], [address], [place], [place] [address]  Report Status PENDING  Incomplete      Radiology Studies: No results found.   Scheduled Meds: . amLODipine  10 mg Oral Daily  . atenolol  100 mg Oral Daily  . atorvastatin  20 mg Oral q1800  . benazepril  20 mg Oral Daily   And  . hydrochlorothiazide  25 mg Oral Daily  . enoxaparin (LOVENOX) injection  40 mg Subcutaneous Q24H  . fluticasone  1 spray Each Nare Daily  . hydrALAZINE  25 mg Oral TID  . insulin aspart  0-5 Units Subcutaneous QHS  . insulin aspart  0-9 Units Subcutaneous TID WC  . insulin glargine  15 Units Subcutaneous QHS  . omega-3 acid ethyl esters  1 g Oral Daily   Continuous Infusions: . sodium chloride Stopped (04/20/18 0354)  . clindamycin (CLEOCIN) IV 600 mg (04/20/18 1201)    Pamella Pertostin Gherghe, MD, PhD [facility] Pager [phone]  If 7PM-7AM, please contact  night-coverage [email or web address] Password [email or web address] 04/20/2018, 2:32 PM

## 2018-04-20 NOTE — Progress Notes (Addendum)
Per Diabetes Manager request Pt self administered insulin. Needs reinforcement, please continue education. As pt  agrees to take insulin at home.  Pt states she already have a glucometer at home.

## 2018-04-21 DIAGNOSIS — K0889 Other specified disorders of teeth and supporting structures: Secondary | ICD-10-CM

## 2018-04-21 DIAGNOSIS — E785 Hyperlipidemia, unspecified: Secondary | ICD-10-CM

## 2018-04-21 LAB — GLUCOSE, CAPILLARY: Glucose-Capillary: 158 mg/dL — ABNORMAL HIGH (ref 70–99)

## 2018-04-21 MED ORDER — CLINDAMYCIN HCL 300 MG PO CAPS: 600.0000 mg | ORAL_CAPSULE | Freq: Three times a day (TID) | ORAL | 0 refills | Status: AC

## 2018-04-21 MED ORDER — INSULIN GLARGINE 100 UNIT/ML ~~LOC~~ SOLN: 15.0000 [IU] | Freq: Every day | SUBCUTANEOUS | 11 refills | Status: DC

## 2018-04-21 MED ORDER — OXYCODONE-ACETAMINOPHEN 5-325 MG PO TABS: 1.0000 | ORAL_TABLET | ORAL | 0 refills | Status: DC | PRN

## 2018-04-21 NOTE — Discharge Summary (Signed)
Physician Discharge Summary  Lauren Waller UJW:[id number] DOB: 19-Jan-1956 DOA: 04/17/2018  PCP: Jackie Plumsei-Bonsu, George, MD  Admit date: 04/17/2018 Discharge date: 04/21/2018  Admitted From: Home Disposition: Home  Recommendations for Outpatient Follow-up:  1. Follow up with dentistry in 1 week  Home Health: None Equipment/Devices: None  Discharge Condition: Stable CODE STATUS: Full code Diet recommendation: Diabetic  HPI: Per Dr. Julio AlmNiu Lauren Waller is a 62 y.o. female with medical history significant of hypertension, hyperlipidemia, diabetes mellitus, gout, depression, who presents with left-sided dental pain and left facial swelling. Pt states that she has dental problem in her left upper and lower molars since 01/2018. She was seen by dentist, but was unable to extract themin April due to elevated blood pressure. Her dental pain has  worsened in the past 4 days. She developed left facial swelling. She has trismus, and is difficult opening her mouth due to pain.  Pain is constant, sharp, severe, aggravated by chewing. She had mild subjective fever, but no chills. The pain is spreading down to her left sided anterior neck. No difficulty breathing. She was seen by dentist today who suspect pt may have abscess and sent to ER for further evaluation and treatment. Patient denies chest pain, cough, difficulty breathing.  No nausea, vomiting, diarrhea, abdominal pain, symptoms of UTI or unilateral weakness.  Hospital Course: Sepsis due to facial cellulitis due to dental infection -On admission she underwent a CT scan which showed left facial odontogenic cellulitis with presumed source being the periapical lucency at tooth 18, where there is dehiscence of the overlying mandibular cortex. There is no formal inpatient dental coverage, her primary dentist is Dr. Hilda BladesArmstrong.  She was placed on IV clindamycin, did have fever last on 6/27 but has been afebrile over the last 48 hours, her cultures  were without growth, her swelling and pain have significantly improved.  She is able to eat and tolerate a regular diet, she will be discharged home in stable condition to continue clindamycin for 7 additional days.  She was strongly advised to call her dentist and set up an appointment close to finishing her antibiotic course to be evaluated for surgery Type 2 diabetes mellitus, uncontrolled with hyperglycemia -Most recent hemoglobin A1c is 11, Apparently at home she is on Amaryl and Onglyza, these are not listed in the medication reconciliation.  She was started on insulin with Lantus given elevated A1c will recommend Lantus on discharge.  She is agreeable. Hypertension -Continue home medications, blood pressure fairly well controlled Hyperlipidemia -Continue Lipitor  Discharge Diagnoses:  Principal Problem:   Facial cellulitis Active Problems:   Diabetes mellitus without complication (HCC)   Hypertension   HLD (hyperlipidemia)   Hypertensive urgency   Sepsis (HCC)  Discharge Instructions  Allergies as of 04/21/2018      Reactions   Januvia [sitagliptin] Swelling   Metformin And Related Nausea And Vomiting      Medication List    STOP taking these medications   BIDIL 20-37.5 MG tablet Generic drug:  isosorbide-hydrALAZINE   lisinopril-hydrochlorothiazide 20-12.5 MG tablet Commonly known as:  PRINZIDE,ZESTORETIC   losartan-hydrochlorothiazide 100-25 MG tablet Commonly known as:  HYZAAR   metoprolol-hydrochlorothiazide 100-25 MG tablet Commonly known as:  LOPRESSOR HCT     TAKE these medications   amLODipine 10 MG tablet Commonly known as:  NORVASC Take 10 mg by mouth daily.   atenolol 100 MG tablet Commonly known as:  TENORMIN Take 100 mg by mouth daily.   benazepril-hydrochlorthiazide 20-25 MG tablet Commonly known  as:  LOTENSIN HCT Take 1 tablet by mouth daily.   clindamycin 300 MG capsule Commonly known as:  CLEOCIN Take 2 capsules (600 mg total) by mouth 3  (three) times daily for 7 days.   diclofenac sodium 1 % Gel Commonly known as:  VOLTAREN Apply 2 g topically 4 (four) times daily as needed (joint pain).   fluticasone 50 MCG/ACT nasal spray Commonly known as:  FLONASE Place into both nostrils daily.   hydrALAZINE 100 MG tablet Commonly known as:  APRESOLINE Take 100 mg by mouth 2 (two) times daily.   ibuprofen 600 MG tablet Commonly known as:  ADVIL,MOTRIN Take 1 tablet (600 mg total) by mouth every 6 (six) hours as needed.   insulin glargine 100 UNIT/ML injection Commonly known as:  LANTUS Inject 0.15 mLs (15 Units total) into the skin at bedtime. OK to dispense pen if covered. Please dispense syringes / needles as well.   LOVAZA PO Take by mouth.   oxyCODONE-acetaminophen 5-325 MG tablet Commonly known as:  PERCOCET/ROXICET Take 1 tablet by mouth every 4 (four) hours as needed for severe pain.      Consultations:  None  Procedures/Studies:  Ct Soft Tissue Neck W Contrast  Result Date: 04/17/2018 CLINICAL DATA:  Tooth pain and left-sided facial swelling. EXAM: CT NECK WITH CONTRAST TECHNIQUE: Multidetector CT imaging of the neck was performed using the standard protocol following the bolus administration of intravenous contrast. CONTRAST:  OMNIPAQUE IOHEXOL 300 MG/ML  SOLN COMPARISON:  None. FINDINGS: PHARYNX AND LARYNX: --Nasopharynx: Fossae of Rosenmuller are clear. Normal adenoid tonsils for age. --Oral cavity and oropharynx: There is a large periapical lucency at the root of tooth 18 with dehiscence of the anterior overlying cortex. There is associated inflammatory change extending into the overlying muscles and subcutaneous fat. No fluid collection. --Hypopharynx: Normal vallecula and pyriform sinuses. --Larynx: Normal epiglottis and pre-epiglottic space. Normal aryepiglottic and vocal folds. --Retropharyngeal space: No abscess, effusion or lymphadenopathy. SALIVARY GLANDS: --Parotid: No mass lesion or  inflammation. No sialolithiasis or ductal dilatation. --Submandibular: Symmetric without inflammation. No sialolithiasis or ductal dilatation. --Sublingual: Normal. No ranula or other visible lesion of the base of tongue and floor of mouth. THYROID: Normal. LYMPH NODES: No enlarged or abnormal density lymph nodes. VASCULAR: Major cervical vessels are patent. LIMITED INTRACRANIAL: Normal. VISUALIZED ORBITS: Normal. MASTOIDS AND VISUALIZED PARANASAL SINUSES: No fluid levels or advanced mucosal thickening. No mastoid effusion. SKELETON: No bony spinal canal stenosis. No lytic or blastic lesions. UPPER CHEST: Clear. OTHER: None. IMPRESSION: Left facial odontogenic cellulitis with presumed source being the periapical lucency at tooth 18, where there is dehiscence of the overlying mandibular cortex. Electronically Signed   By: Deatra Robinson M.D.   On: 04/17/2018 19:09     Subjective: - no chest pain, shortness of breath, no abdominal pain, nausea or vomiting.   Discharge Exam: Vitals:   04/20/18 2116 04/21/18 0444  BP: (!) 161/82 128/65  Pulse: 62 61  Resp: 17 17  Temp: 98.2 F (36.8 C) 98.8 F (37.1 C)  SpO2: 98% 100%    General: Pt is alert, awake, not in acute distress Cardiovascular: RRR, S1/S2 +, no rubs, no gallops Respiratory: CTA bilaterally, no wheezing, no rhonchi Abdominal: Soft, NT, ND, bowel sounds + Extremities: no edema, no cyanosis    The results of significant diagnostics from this hospitalization (including imaging, microbiology, ancillary and laboratory) are listed below for reference.     Microbiology: Recent Results (from the past 240 hour(s))  Culture, blood (  x 2)     Status: None (Preliminary result)   Collection Time: 04/17/18  8:41 PM  Result Value Ref Range Status   Specimen Description BLOOD LEFT ANTECUBITAL  Final   Special Requests   Final    BOTTLES DRAWN AEROBIC AND ANAEROBIC Blood Culture adequate volume   Culture   Final    NO GROWTH 4 DAYS Performed  at [facility], [address] [place], [place] [address]    Report Status PENDING  Incomplete  Culture, blood (x 2)     Status: None (Preliminary result)   Collection Time: 04/17/18  8:46 PM  Result Value Ref Range Status   Specimen Description BLOOD RIGHT ANTECUBITAL  Final   Special Requests   Final    BOTTLES DRAWN AEROBIC AND ANAEROBIC Blood Culture adequate volume   Culture   Final    NO GROWTH 4 DAYS Performed at [facility], [address], [place], [place] [address]    Report Status PENDING  Incomplete     Labs: BNP (last 3 results) No results for input(s): BNP in the last 8760 hours. Basic Metabolic Panel: Recent Labs  Lab 04/17/18 1620 04/18/18 0449 04/19/18 0607 04/20/18 0704  NA 132* 135 138 139  K 3.7 3.8 3.7 3.2*  CL 95* 100 103 105  CO2 26 27 25 26   GLUCOSE 379* 253* 123* 92  BUN 13 12 15 11   CREATININE 0.75 0.65 0.75 0.65  CALCIUM 9.5 9.0 8.9 8.9   Liver Function Tests: No results for input(s): AST, ALT, ALKPHOS, BILITOT, PROT, ALBUMIN in the last 168 hours. No results for input(s): LIPASE, AMYLASE in the last 168 hours. No results for input(s): AMMONIA in the last 168 hours. CBC: Recent Labs  Lab 04/17/18 1620 04/18/18 0449 04/19/18 0607 04/20/18 0704  WBC 12.7* 12.3* 7.7 7.3  NEUTROABS 9.5*  --   --   --   HGB 11.3* 9.9* 9.5* 9.3*  HCT 37.0 31.4* 30.7* 29.7*  MCV 89.4 87.7 89.0 87.6  PLT 330 288 272 280   Cardiac Enzymes: No results for input(s): CKTOTAL, CKMB, CKMBINDEX, TROPONINI in the last 168 hours. BNP: Invalid input(s): POCBNP CBG: Recent Labs  Lab 04/20/18 0815 04/20/18 1223 04/20/18 1712 04/20/18 2114 04/21/18 0759  GLUCAP 96 228* 284* 229* 158*   D-Dimer No results for input(s): DDIMER in the last 72 hours. Hgb A1c No results for input(s): HGBA1C in the last 72 hours. Lipid Profile No results for input(s): CHOL, HDL, LDLCALC, TRIG, CHOLHDL, LDLDIRECT in the last 72 hours. Thyroid function studies No  results for input(s): TSH, T4TOTAL, T3FREE, THYROIDAB in the last 72 hours.  Invalid input(s): FREET3 Anemia work up No results for input(s): VITAMINB12, FOLATE, FERRITIN, TIBC, IRON, RETICCTPCT in the last 72 hours. Urinalysis No results found for: COLORURINE, APPEARANCEUR, LABSPEC, PHURINE, GLUCOSEU, HGBUR, BILIRUBINUR, KETONESUR, PROTEINUR, UROBILINOGEN, NITRITE, LEUKOCYTESUR Sepsis Labs Invalid input(s): PROCALCITONIN,  WBC,  LACTICIDVEN   Time coordinating discharge: 35 minutes  SIGNED:  Pamella Pert, MD  [facility] 04/21/2018, 12:46 PM Pager [phone]  If 7PM-7AM, please contact night-coverage [email or web address] Password [email or web address]

## 2018-04-21 NOTE — Progress Notes (Signed)
Discharge paperwork reviewed with patient. No questions verbalized. Patient is ready for discharge.  

## 2018-04-22 LAB — CULTURE, BLOOD (ROUTINE X 2)
CULTURE: NO GROWTH
Culture: NO GROWTH
SPECIAL REQUESTS: ADEQUATE
Special Requests: ADEQUATE

## 2018-10-08 ENCOUNTER — Ambulatory Visit: Payer: Medicare Other | Admitting: Registered"

## 2018-10-08 ENCOUNTER — Encounter: Payer: Self-pay | Admitting: Internal Medicine

## 2018-10-12 ENCOUNTER — Ambulatory Visit: Payer: Medicare Other | Admitting: Registered"

## 2018-10-25 ENCOUNTER — Ambulatory Visit: Payer: Medicare Other | Admitting: Registered"

## 2018-12-20 ENCOUNTER — Other Ambulatory Visit: Payer: Self-pay | Admitting: Cardiology

## 2019-01-01 DIAGNOSIS — E1165 Type 2 diabetes mellitus with hyperglycemia: Secondary | ICD-10-CM | POA: Diagnosis not present

## 2019-01-01 DIAGNOSIS — E7800 Pure hypercholesterolemia, unspecified: Secondary | ICD-10-CM | POA: Diagnosis not present

## 2019-01-01 DIAGNOSIS — E559 Vitamin D deficiency, unspecified: Secondary | ICD-10-CM | POA: Diagnosis not present

## 2019-01-01 DIAGNOSIS — D649 Anemia, unspecified: Secondary | ICD-10-CM | POA: Diagnosis not present

## 2019-01-01 DIAGNOSIS — I10 Essential (primary) hypertension: Secondary | ICD-10-CM | POA: Diagnosis not present

## 2019-01-08 ENCOUNTER — Ambulatory Visit: Payer: Medicare Other | Admitting: Registered"

## 2019-05-13 DIAGNOSIS — Z0000 Encounter for general adult medical examination without abnormal findings: Secondary | ICD-10-CM | POA: Diagnosis not present

## 2019-05-13 DIAGNOSIS — E559 Vitamin D deficiency, unspecified: Secondary | ICD-10-CM | POA: Diagnosis not present

## 2019-05-13 DIAGNOSIS — I10 Essential (primary) hypertension: Secondary | ICD-10-CM | POA: Diagnosis not present

## 2019-05-13 DIAGNOSIS — E1165 Type 2 diabetes mellitus with hyperglycemia: Secondary | ICD-10-CM | POA: Diagnosis not present

## 2019-05-13 DIAGNOSIS — Z136 Encounter for screening for cardiovascular disorders: Secondary | ICD-10-CM | POA: Diagnosis not present

## 2019-06-06 DIAGNOSIS — L84 Corns and callosities: Secondary | ICD-10-CM | POA: Diagnosis not present

## 2019-06-06 DIAGNOSIS — L603 Nail dystrophy: Secondary | ICD-10-CM | POA: Diagnosis not present

## 2019-06-06 DIAGNOSIS — I739 Peripheral vascular disease, unspecified: Secondary | ICD-10-CM | POA: Diagnosis not present

## 2019-06-06 DIAGNOSIS — E1151 Type 2 diabetes mellitus with diabetic peripheral angiopathy without gangrene: Secondary | ICD-10-CM | POA: Diagnosis not present

## 2019-07-08 ENCOUNTER — Other Ambulatory Visit: Payer: Self-pay | Admitting: Cardiology

## 2019-07-09 NOTE — Telephone Encounter (Signed)
Can this be filled ?

## 2019-07-31 ENCOUNTER — Other Ambulatory Visit: Payer: Self-pay

## 2019-07-31 DIAGNOSIS — I83893 Varicose veins of bilateral lower extremities with other complications: Secondary | ICD-10-CM

## 2019-08-02 ENCOUNTER — Emergency Department (HOSPITAL_COMMUNITY): Payer: Medicare Other

## 2019-08-02 ENCOUNTER — Other Ambulatory Visit: Payer: Self-pay

## 2019-08-02 ENCOUNTER — Inpatient Hospital Stay (HOSPITAL_COMMUNITY): Payer: Medicare Other

## 2019-08-02 ENCOUNTER — Inpatient Hospital Stay (HOSPITAL_COMMUNITY)
Admission: EM | Admit: 2019-08-02 | Discharge: 2019-08-10 | DRG: 065 | Disposition: A | Discharge status: Skilled Nursing Facility | Payer: Medicare Other | Attending: Internal Medicine | Admitting: Internal Medicine

## 2019-08-02 ENCOUNTER — Encounter (HOSPITAL_COMMUNITY): Payer: Self-pay | Admitting: Emergency Medicine

## 2019-08-02 DIAGNOSIS — I63442 Cerebral infarction due to embolism of left cerebellar artery: Principal | ICD-10-CM | POA: Diagnosis present

## 2019-08-02 DIAGNOSIS — E876 Hypokalemia: Secondary | ICD-10-CM | POA: Diagnosis not present

## 2019-08-02 DIAGNOSIS — Z888 Allergy status to other drugs, medicaments and biological substances status: Secondary | ICD-10-CM

## 2019-08-02 DIAGNOSIS — I639 Cerebral infarction, unspecified: Secondary | ICD-10-CM | POA: Diagnosis present

## 2019-08-02 DIAGNOSIS — Z800 Family history of malignant neoplasm of digestive organs: Secondary | ICD-10-CM

## 2019-08-02 DIAGNOSIS — T465X6A Underdosing of other antihypertensive drugs, initial encounter: Secondary | ICD-10-CM | POA: Diagnosis present

## 2019-08-02 DIAGNOSIS — R296 Repeated falls: Secondary | ICD-10-CM | POA: Diagnosis not present

## 2019-08-02 DIAGNOSIS — I6389 Other cerebral infarction: Secondary | ICD-10-CM | POA: Diagnosis not present

## 2019-08-02 DIAGNOSIS — R52 Pain, unspecified: Secondary | ICD-10-CM

## 2019-08-02 DIAGNOSIS — E869 Volume depletion, unspecified: Secondary | ICD-10-CM | POA: Diagnosis present

## 2019-08-02 DIAGNOSIS — S0990XA Unspecified injury of head, initial encounter: Secondary | ICD-10-CM | POA: Diagnosis not present

## 2019-08-02 DIAGNOSIS — T383X6A Underdosing of insulin and oral hypoglycemic [antidiabetic] drugs, initial encounter: Secondary | ICD-10-CM | POA: Diagnosis not present

## 2019-08-02 DIAGNOSIS — G8929 Other chronic pain: Secondary | ICD-10-CM | POA: Diagnosis not present

## 2019-08-02 DIAGNOSIS — R739 Hyperglycemia, unspecified: Secondary | ICD-10-CM

## 2019-08-02 DIAGNOSIS — Z8673 Personal history of transient ischemic attack (TIA), and cerebral infarction without residual deficits: Secondary | ICD-10-CM

## 2019-08-02 DIAGNOSIS — R112 Nausea with vomiting, unspecified: Secondary | ICD-10-CM | POA: Diagnosis present

## 2019-08-02 DIAGNOSIS — N179 Acute kidney failure, unspecified: Secondary | ICD-10-CM | POA: Diagnosis present

## 2019-08-02 DIAGNOSIS — I63542 Cerebral infarction due to unspecified occlusion or stenosis of left cerebellar artery: Secondary | ICD-10-CM | POA: Diagnosis not present

## 2019-08-02 DIAGNOSIS — G3281 Cerebellar ataxia in diseases classified elsewhere: Secondary | ICD-10-CM | POA: Diagnosis not present

## 2019-08-02 DIAGNOSIS — M545 Low back pain: Secondary | ICD-10-CM | POA: Diagnosis present

## 2019-08-02 DIAGNOSIS — R29810 Facial weakness: Secondary | ICD-10-CM | POA: Diagnosis present

## 2019-08-02 DIAGNOSIS — S72144D Nondisplaced intertrochanteric fracture of right femur, subsequent encounter for closed fracture with routine healing: Secondary | ICD-10-CM | POA: Diagnosis not present

## 2019-08-02 DIAGNOSIS — Z20828 Contact with and (suspected) exposure to other viral communicable diseases: Secondary | ICD-10-CM | POA: Diagnosis present

## 2019-08-02 DIAGNOSIS — Z7401 Bed confinement status: Secondary | ICD-10-CM | POA: Diagnosis not present

## 2019-08-02 DIAGNOSIS — E785 Hyperlipidemia, unspecified: Secondary | ICD-10-CM | POA: Diagnosis present

## 2019-08-02 DIAGNOSIS — R778 Other specified abnormalities of plasma proteins: Secondary | ICD-10-CM

## 2019-08-02 DIAGNOSIS — Z794 Long term (current) use of insulin: Secondary | ICD-10-CM

## 2019-08-02 DIAGNOSIS — Z90710 Acquired absence of both cervix and uterus: Secondary | ICD-10-CM

## 2019-08-02 DIAGNOSIS — E669 Obesity, unspecified: Secondary | ICD-10-CM | POA: Diagnosis present

## 2019-08-02 DIAGNOSIS — I160 Hypertensive urgency: Secondary | ICD-10-CM

## 2019-08-02 DIAGNOSIS — S199XXA Unspecified injury of neck, initial encounter: Secondary | ICD-10-CM | POA: Diagnosis not present

## 2019-08-02 DIAGNOSIS — I672 Cerebral atherosclerosis: Secondary | ICD-10-CM | POA: Diagnosis not present

## 2019-08-02 DIAGNOSIS — R29700 NIHSS score 0: Secondary | ICD-10-CM | POA: Diagnosis present

## 2019-08-02 DIAGNOSIS — Z6831 Body mass index (BMI) 31.0-31.9, adult: Secondary | ICD-10-CM | POA: Diagnosis not present

## 2019-08-02 DIAGNOSIS — D72829 Elevated white blood cell count, unspecified: Secondary | ICD-10-CM | POA: Diagnosis present

## 2019-08-02 DIAGNOSIS — R1111 Vomiting without nausea: Secondary | ICD-10-CM | POA: Diagnosis not present

## 2019-08-02 DIAGNOSIS — E119 Type 2 diabetes mellitus without complications: Secondary | ICD-10-CM | POA: Diagnosis not present

## 2019-08-02 DIAGNOSIS — M6281 Muscle weakness (generalized): Secondary | ICD-10-CM | POA: Diagnosis not present

## 2019-08-02 DIAGNOSIS — E1165 Type 2 diabetes mellitus with hyperglycemia: Secondary | ICD-10-CM | POA: Diagnosis not present

## 2019-08-02 DIAGNOSIS — R410 Disorientation, unspecified: Secondary | ICD-10-CM | POA: Diagnosis not present

## 2019-08-02 DIAGNOSIS — U071 COVID-19: Secondary | ICD-10-CM | POA: Diagnosis not present

## 2019-08-02 DIAGNOSIS — Z79899 Other long term (current) drug therapy: Secondary | ICD-10-CM

## 2019-08-02 DIAGNOSIS — Z713 Dietary counseling and surveillance: Secondary | ICD-10-CM

## 2019-08-02 DIAGNOSIS — I10 Essential (primary) hypertension: Secondary | ICD-10-CM | POA: Diagnosis not present

## 2019-08-02 DIAGNOSIS — R7989 Other specified abnormal findings of blood chemistry: Secondary | ICD-10-CM

## 2019-08-02 DIAGNOSIS — S299XXA Unspecified injury of thorax, initial encounter: Secondary | ICD-10-CM | POA: Diagnosis not present

## 2019-08-02 DIAGNOSIS — R55 Syncope and collapse: Secondary | ICD-10-CM | POA: Diagnosis not present

## 2019-08-02 DIAGNOSIS — G459 Transient cerebral ischemic attack, unspecified: Secondary | ICD-10-CM | POA: Diagnosis not present

## 2019-08-02 DIAGNOSIS — M2550 Pain in unspecified joint: Secondary | ICD-10-CM | POA: Diagnosis not present

## 2019-08-02 DIAGNOSIS — R000 Tachycardia, unspecified: Secondary | ICD-10-CM | POA: Diagnosis not present

## 2019-08-02 DIAGNOSIS — W19XXXD Unspecified fall, subsequent encounter: Secondary | ICD-10-CM | POA: Diagnosis not present

## 2019-08-02 DIAGNOSIS — R471 Dysarthria and anarthria: Secondary | ICD-10-CM | POA: Diagnosis not present

## 2019-08-02 DIAGNOSIS — Z8249 Family history of ischemic heart disease and other diseases of the circulatory system: Secondary | ICD-10-CM

## 2019-08-02 DIAGNOSIS — S3992XA Unspecified injury of lower back, initial encounter: Secondary | ICD-10-CM | POA: Diagnosis not present

## 2019-08-02 DIAGNOSIS — R404 Transient alteration of awareness: Secondary | ICD-10-CM | POA: Diagnosis not present

## 2019-08-02 LAB — LIPASE, BLOOD: Lipase: 18 U/L (ref 11–51)

## 2019-08-02 LAB — URINALYSIS, ROUTINE W REFLEX MICROSCOPIC
Bilirubin Urine: NEGATIVE
Glucose, UA: >=500 mg/dL — AB
Hgb urine dipstick: NEGATIVE
Ketones, ur: 20 mg/dL — AB
Leukocytes,Ua: NEGATIVE
Nitrite: NEGATIVE
Protein, ur: 100 mg/dL — AB
Specific Gravity, Urine: 1.03 (ref 1.005–1.030)
pH: 6 (ref 5.0–8.0)

## 2019-08-02 LAB — RAPID URINE DRUG SCREEN, HOSP PERFORMED
Amphetamines: NOT DETECTED
Barbiturates: NOT DETECTED
Benzodiazepines: NOT DETECTED
Cocaine: NOT DETECTED
Opiates: NOT DETECTED
Tetrahydrocannabinol: NOT DETECTED

## 2019-08-02 LAB — CBC WITH DIFFERENTIAL/PLATELET
Abs Immature Granulocytes: 0.07 10*3/uL (ref 0.00–0.07)
Basophils Absolute: 0 10*3/uL (ref 0.0–0.1)
Basophils Relative: 0 %
Eosinophils Absolute: 0 10*3/uL (ref 0.0–0.5)
Eosinophils Relative: 0 %
HCT: 47.8 % — ABNORMAL HIGH (ref 36.0–46.0)
Hemoglobin: 14.9 g/dL (ref 12.0–15.0)
Immature Granulocytes: 1 %
Lymphocytes Relative: 10 %
Lymphs Abs: 1.6 10*3/uL (ref 0.7–4.0)
MCH: 27.6 pg (ref 26.0–34.0)
MCHC: 31.2 g/dL (ref 30.0–36.0)
MCV: 88.7 fL (ref 80.0–100.0)
Monocytes Absolute: 0.5 10*3/uL (ref 0.1–1.0)
Monocytes Relative: 3 %
Neutro Abs: 13.4 10*3/uL — ABNORMAL HIGH (ref 1.7–7.7)
Neutrophils Relative %: 86 %
Platelets: 408 10*3/uL — ABNORMAL HIGH (ref 150–400)
RBC: 5.39 MIL/uL — ABNORMAL HIGH (ref 3.87–5.11)
RDW: 12.7 % (ref 11.5–15.5)
WBC: 15.6 10*3/uL — ABNORMAL HIGH (ref 4.0–10.5)
nRBC: 0 % (ref 0.0–0.2)

## 2019-08-02 LAB — COMPREHENSIVE METABOLIC PANEL
ALT: 18 U/L (ref 0–44)
AST: 18 U/L (ref 15–41)
Albumin: 3.6 g/dL (ref 3.5–5.0)
Alkaline Phosphatase: 135 U/L — ABNORMAL HIGH (ref 38–126)
Anion gap: 21 — ABNORMAL HIGH (ref 5–15)
BUN: 29 mg/dL — ABNORMAL HIGH (ref 8–23)
CO2: 24 mmol/L (ref 22–32)
Calcium: 10 mg/dL (ref 8.9–10.3)
Chloride: 96 mmol/L — ABNORMAL LOW (ref 98–111)
Creatinine, Ser: 1.28 mg/dL — ABNORMAL HIGH (ref 0.44–1.00)
GFR calc Af Amer: 52 mL/min — ABNORMAL LOW (ref >60)
GFR calc non Af Amer: 45 mL/min — ABNORMAL LOW (ref >60)
Glucose, Bld: 570 mg/dL (ref 70–99)
Potassium: 3.6 mmol/L (ref 3.5–5.1)
Sodium: 141 mmol/L (ref 135–145)
Total Bilirubin: 1.4 mg/dL — ABNORMAL HIGH (ref 0.3–1.2)
Total Protein: 8.3 g/dL — ABNORMAL HIGH (ref 6.5–8.1)

## 2019-08-02 LAB — CBG MONITORING, ED
Glucose-Capillary: 596 mg/dL (ref 70–99)
Glucose-Capillary: 491 mg/dL — ABNORMAL HIGH (ref 70–99)
Glucose-Capillary: 413 mg/dL — ABNORMAL HIGH (ref 70–99)
Glucose-Capillary: 370 mg/dL — ABNORMAL HIGH (ref 70–99)
Glucose-Capillary: 329 mg/dL — ABNORMAL HIGH (ref 70–99)

## 2019-08-02 LAB — POCT I-STAT EG7
Acid-Base Excess: 1 mmol/L (ref 0.0–2.0)
Bicarbonate: 25.7 mmol/L (ref 20.0–28.0)
Calcium, Ion: 1.14 mmol/L — ABNORMAL LOW (ref 1.15–1.40)
HCT: 43 % (ref 36.0–46.0)
Hemoglobin: 14.6 g/dL (ref 12.0–15.0)
O2 Saturation: 81 %
Potassium: 3.5 mmol/L (ref 3.5–5.1)
Sodium: 144 mmol/L (ref 135–145)
TCO2: 27 mmol/L (ref 22–32)
pCO2, Ven: 40.8 mmHg — ABNORMAL LOW (ref 44.0–60.0)
pH, Ven: 7.406 (ref 7.250–7.430)
pO2, Ven: 45 mmHg (ref 32.0–45.0)

## 2019-08-02 LAB — TROPONIN I (HIGH SENSITIVITY)
Troponin I (High Sensitivity): 20 ng/L — ABNORMAL HIGH (ref <18)
Troponin I (High Sensitivity): 19 ng/L — ABNORMAL HIGH (ref <18)

## 2019-08-02 LAB — GLUCOSE, CAPILLARY
Glucose-Capillary: 363 mg/dL — ABNORMAL HIGH (ref 70–99)
Glucose-Capillary: 257 mg/dL — ABNORMAL HIGH (ref 70–99)

## 2019-08-02 LAB — CK: Total CK: 90 U/L (ref 38–234)

## 2019-08-02 LAB — TSH: TSH: 0.522 u[IU]/mL (ref 0.350–4.500)

## 2019-08-02 LAB — ECHOCARDIOGRAM COMPLETE

## 2019-08-02 LAB — SARS CORONAVIRUS 2 BY RT PCR (HOSPITAL ORDER, PERFORMED IN ~~LOC~~ HOSPITAL LAB): SARS Coronavirus 2: NEGATIVE

## 2019-08-02 LAB — ETHANOL: Alcohol, Ethyl (B): <10 mg/dL (ref <10)

## 2019-08-02 LAB — LACTIC ACID, PLASMA: Lactic Acid, Venous: 1.7 mmol/L (ref 0.5–1.9)

## 2019-08-02 LAB — HIV ANTIBODY (ROUTINE TESTING W REFLEX): HIV Screen 4th Generation wRfx: NONREACTIVE

## 2019-08-02 MED ORDER — INSULIN GLARGINE 100 UNIT/ML ~~LOC~~ SOLN
15.0000 [IU] | Freq: Every day | SUBCUTANEOUS | Status: DC
  Administered 2019-08-02 – 2019-08-05 (×4): 15 [IU] via SUBCUTANEOUS
  Filled 2019-08-02 (×5): qty 0.15

## 2019-08-02 MED ORDER — INSULIN ASPART 100 UNIT/ML ~~LOC~~ SOLN: 10.0000 [IU] | Freq: Once | SUBCUTANEOUS | Status: DC

## 2019-08-02 MED ORDER — ONDANSETRON HCL 4 MG/2ML IJ SOLN
4.0000 mg | Freq: Once | INTRAMUSCULAR | Status: AC
  Administered 2019-08-02: 4 mg via INTRAVENOUS
  Filled 2019-08-02: qty 2

## 2019-08-02 MED ORDER — ACETAMINOPHEN 650 MG RE SUPP: 650.0000 mg | RECTAL | Status: DC | PRN

## 2019-08-02 MED ORDER — SODIUM CHLORIDE 0.9 % IV BOLUS
500.0000 mL | Freq: Once | INTRAVENOUS | Status: AC
  Administered 2019-08-02: 500 mL via INTRAVENOUS

## 2019-08-02 MED ORDER — INSULIN ASPART 100 UNIT/ML ~~LOC~~ SOLN
0.0000 [IU] | Freq: Every day | SUBCUTANEOUS | Status: DC
  Administered 2019-08-02 – 2019-08-03 (×2): 3 [IU] via SUBCUTANEOUS
  Administered 2019-08-04: 4 [IU] via SUBCUTANEOUS
  Administered 2019-08-05: 3 [IU] via SUBCUTANEOUS
  Administered 2019-08-06: 2 [IU] via SUBCUTANEOUS

## 2019-08-02 MED ORDER — STROKE: EARLY STAGES OF RECOVERY BOOK
Freq: Once | Status: AC
  Administered 2019-08-02: 1
  Filled 2019-08-02: qty 1

## 2019-08-02 MED ORDER — ATENOLOL 50 MG PO TABS
100.0000 mg | ORAL_TABLET | Freq: Once | ORAL | Status: AC
  Administered 2019-08-02: 100 mg via ORAL
  Filled 2019-08-02: qty 2

## 2019-08-02 MED ORDER — INSULIN ASPART 100 UNIT/ML ~~LOC~~ SOLN
0.0000 [IU] | Freq: Three times a day (TID) | SUBCUTANEOUS | Status: DC
  Administered 2019-08-02: 15 [IU] via SUBCUTANEOUS
  Administered 2019-08-03: 11 [IU] via SUBCUTANEOUS
  Administered 2019-08-03: 5 [IU] via SUBCUTANEOUS
  Administered 2019-08-03: 2 [IU] via SUBCUTANEOUS
  Administered 2019-08-04: 5 [IU] via SUBCUTANEOUS
  Administered 2019-08-04: 8 [IU] via SUBCUTANEOUS
  Administered 2019-08-04 – 2019-08-05 (×2): 3 [IU] via SUBCUTANEOUS
  Administered 2019-08-05: 2 [IU] via SUBCUTANEOUS
  Administered 2019-08-05: 8 [IU] via SUBCUTANEOUS
  Administered 2019-08-06: 2 [IU] via SUBCUTANEOUS
  Administered 2019-08-06: 3 [IU] via SUBCUTANEOUS
  Administered 2019-08-06: 5 [IU] via SUBCUTANEOUS
  Administered 2019-08-07: 8 [IU] via SUBCUTANEOUS
  Administered 2019-08-08: 5 [IU] via SUBCUTANEOUS
  Administered 2019-08-08: 3 [IU] via SUBCUTANEOUS
  Administered 2019-08-09: 8 [IU] via SUBCUTANEOUS
  Administered 2019-08-09: 5 [IU] via SUBCUTANEOUS
  Administered 2019-08-10: 11 [IU] via SUBCUTANEOUS

## 2019-08-02 MED ORDER — DEXTROSE-NACL 5-0.45 % IV SOLN: INTRAVENOUS | Status: DC

## 2019-08-02 MED ORDER — ATORVASTATIN CALCIUM 40 MG PO TABS
40.0000 mg | ORAL_TABLET | Freq: Every day | ORAL | Status: DC
  Administered 2019-08-02 – 2019-08-10 (×7): 40 mg via ORAL
  Filled 2019-08-02 (×10): qty 1

## 2019-08-02 MED ORDER — SODIUM CHLORIDE 0.9 % IV SOLN
INTRAVENOUS | Status: DC
  Administered 2019-08-02 (×2): via INTRAVENOUS

## 2019-08-02 MED ORDER — ASPIRIN EC 325 MG PO TBEC
325.0000 mg | DELAYED_RELEASE_TABLET | Freq: Every day | ORAL | Status: DC
  Administered 2019-08-02 – 2019-08-03 (×2): 325 mg via ORAL
  Filled 2019-08-02 (×2): qty 1

## 2019-08-02 MED ORDER — SODIUM CHLORIDE 0.9 % IV BOLUS
500.0000 mL | Freq: Once | INTRAVENOUS | Status: AC
  Administered 2019-08-02: 05:00:00 via INTRAVENOUS

## 2019-08-02 MED ORDER — HYDRALAZINE HCL 25 MG PO TABS
100.0000 mg | ORAL_TABLET | Freq: Once | ORAL | Status: AC
  Administered 2019-08-02: 100 mg via ORAL
  Filled 2019-08-02: qty 4

## 2019-08-02 MED ORDER — LABETALOL HCL 5 MG/ML IV SOLN
10.0000 mg | INTRAVENOUS | Status: DC | PRN
  Administered 2019-08-02: 10 mg via INTRAVENOUS
  Filled 2019-08-02: qty 4

## 2019-08-02 MED ORDER — ASPIRIN 300 MG RE SUPP: 300.0000 mg | Freq: Every day | RECTAL | Status: DC

## 2019-08-02 MED ORDER — ACETAMINOPHEN 160 MG/5ML PO SOLN: 650.0000 mg | ORAL | Status: DC | PRN

## 2019-08-02 MED ORDER — BENAZEPRIL HCL 20 MG PO TABS
20.0000 mg | ORAL_TABLET | Freq: Every day | ORAL | Status: DC
  Filled 2019-08-02: qty 1

## 2019-08-02 MED ORDER — ACETAMINOPHEN 325 MG PO TABS
650.0000 mg | ORAL_TABLET | ORAL | Status: DC | PRN
  Filled 2019-08-02: qty 2

## 2019-08-02 MED ORDER — ENOXAPARIN SODIUM 40 MG/0.4ML ~~LOC~~ SOLN
40.0000 mg | SUBCUTANEOUS | Status: DC
  Administered 2019-08-03 – 2019-08-10 (×8): 40 mg via SUBCUTANEOUS
  Filled 2019-08-02 (×8): qty 0.4

## 2019-08-02 MED ORDER — SODIUM CHLORIDE 0.9% FLUSH: 3.0000 mL | Freq: Once | INTRAVENOUS | Status: DC

## 2019-08-02 MED ORDER — SODIUM CHLORIDE 0.9 % IV SOLN
INTRAVENOUS | Status: DC
  Administered 2019-08-02: 08:00:00 via INTRAVENOUS

## 2019-08-02 MED ORDER — HYDROCHLOROTHIAZIDE 25 MG PO TABS
25.0000 mg | ORAL_TABLET | Freq: Every day | ORAL | Status: DC
  Administered 2019-08-02: 25 mg via ORAL
  Filled 2019-08-02 (×2): qty 1

## 2019-08-02 MED ORDER — SENNOSIDES-DOCUSATE SODIUM 8.6-50 MG PO TABS
1.0000 | ORAL_TABLET | Freq: Every evening | ORAL | Status: DC | PRN
  Administered 2019-08-06 – 2019-08-07 (×2): 1 via ORAL
  Filled 2019-08-02 (×2): qty 1

## 2019-08-02 MED ORDER — AMLODIPINE BESYLATE 5 MG PO TABS
10.0000 mg | ORAL_TABLET | Freq: Once | ORAL | Status: AC
  Administered 2019-08-02: 10 mg via ORAL
  Filled 2019-08-02: qty 2

## 2019-08-02 MED ORDER — INSULIN REGULAR(HUMAN) IN NACL 100-0.9 UT/100ML-% IV SOLN
INTRAVENOUS | Status: DC
  Administered 2019-08-02: 4.3 [IU]/h via INTRAVENOUS
  Filled 2019-08-02: qty 100

## 2019-08-02 NOTE — Consult Note (Signed)
Referring Physician: Dr. Preston Fleeting    Chief Complaint: Status post fall  HPI: Lauren Waller is an 63 y.o. female who presented to the ED early this AM after having been found on the floor beside her bed. She had lost her balance, which had resulted in the fall. EMS was called to her home and on arrival noted CBG to be 515. On arrival to the ED she was markedly hypertensive.   She had been vomiting, with nausea for several days. Due to this, she had been unable to take any of her medications for her hypertension or diabetes. She was complaining of acute persistent dizziness with recurrent falls for about 2 days. She reported room spinning dizziness when standing up.    EKG:  Sinus tachycardia Prominent P waves, nondiagnostic Abnormal R-wave progression, late transition Left ventricular hypertrophy Nonspecific T abnormalities, lateral leads When compared with ECG of 04/17/2018, No significant change was found.  LSN: > 24 hours in the context of vomiting for several days, likely secondary to the cerebellar infarction seen on CT tPA Given: No: Out of time window  Past Medical History:  Diagnosis Date  . Chronic lower back pain   . Depression   . Facial cellulitis 04/17/2018  . HLD (hyperlipidemia)   . Hypertension   . Type II diabetes mellitus (HCC)     Past Surgical History:  Procedure Laterality Date  . CATARACT EXTRACTION W/ INTRAOCULAR LENS  IMPLANT, BILATERAL Bilateral   . EYE SURGERY Bilateral    2016  . fatty tissue removal from upper back     1998  . INGUINAL HERNIA REPAIR Left   . MYOMECTOMY    . ROBOTIC ASSISTED LAPAROSCOPIC VAGINAL HYSTERECTOMY WITH FIBROID REMOVAL     2009    Family History  Problem Relation Age of Onset  . Hypertension Mother   . Pancreatic cancer Brother    Social History:  reports that she has never smoked. She has never used smokeless tobacco. She reports previous alcohol use. She reports that she does not use drugs.  Allergies:  Allergies   Allergen Reactions  . Januvia [Sitagliptin] Swelling  . Metformin And Related Nausea And Vomiting    Medications:  No current facility-administered medications on file prior to encounter.    Current Outpatient Medications on File Prior to Encounter  Medication Sig Dispense Refill  . amLODipine (NORVASC) 10 MG tablet Take 10 mg by mouth daily.  3  . atenolol (TENORMIN) 100 MG tablet Take 1 tablet by mouth daily 30 tablet 6  . benazepril-hydrochlorthiazide (LOTENSIN HCT) 20-25 MG tablet Take 1 tablet by mouth daily.  2  . diclofenac sodium (VOLTAREN) 1 % GEL Apply 2 g topically 4 (four) times daily as needed (joint pain).     . fluticasone (FLONASE) 50 MCG/ACT nasal spray Place into both nostrils daily.    . hydrALAZINE (APRESOLINE) 100 MG tablet Take 100 mg by mouth 2 (two) times daily.  0  . ibuprofen (ADVIL,MOTRIN) 600 MG tablet Take 1 tablet (600 mg total) by mouth every 6 (six) hours as needed. (Patient not taking: Reported on 08/29/2017) 30 tablet 0  . insulin glargine (LANTUS) 100 UNIT/ML injection Inject 0.15 mLs (15 Units total) into the skin at bedtime. OK to dispense pen if covered. Please dispense syringes / needles as well. 10 mL 11  . Omega-3-acid Ethyl Esters (LOVAZA PO) Take by mouth.    . oxyCODONE-acetaminophen (PERCOCET/ROXICET) 5-325 MG tablet Take 1 tablet by mouth every 4 (four) hours as needed  for severe pain. 15 tablet 0     ROS: No fever or chills. Has been fatigued. Other ROS aas per HPI. Unable to obtain more detailed ROS due to AMS.   Physical Examination: Blood pressure (!) 172/83, pulse 74, temperature 98.5 F (36.9 C), temperature source Oral, resp. rate 14, SpO2 97 %.  HEENT: [place]/AT. Oral mucosa poorly hydrated.  Lungs: Respirations unlabored Ext: No edema  Neurologic Examination: Mental Status: Drowsy and lethargic. Able to follow basic motor commands. Speech dysarthric but fluent, with intact comprehension and naming. Fully oriented to location and  time.  Cranial Nerves: II:  Does not comply with visual field testing. Can count fingers.  III,IV, VI: EOM are full.  V,VII: Mild right facial droop. Does not answer consistently when testing facial sensation VIII: hearing intact to voice IX,X: Does not open mouth sufficiently to view palate XI: Head at midline XII: midline tongue extension  Motor: Diffuse motor weakness at 4-/5 without asymmetry.  Sensory: States temp sensation is present x 4 but does not clarify if it is symmetric. FT intact.  Deep Tendon Reflexes:  Hypoactive reflexes x 4 with no asymmetry Cerebellar: Does not perform FNF with sufficient effort for definitive assessment. In this context there is no gross ataxia.  Gait: Unable to assess  Results for orders placed or performed during the hospital encounter of 08/02/19 (from the past 48 hour(s))  CBG monitoring, ED     Status: Abnormal   Collection Time: 08/02/19  2:28 AM  Result Value Ref Range   Glucose-Capillary 596 (HH) 70 - 99 mg/dL  CBC with Differential     Status: Abnormal   Collection Time: 08/02/19  2:40 AM  Result Value Ref Range   WBC 15.6 (H) 4.0 - 10.5 K/uL   RBC 5.39 (H) 3.87 - 5.11 MIL/uL   Hemoglobin 14.9 12.0 - 15.0 g/dL   HCT 47.8 (H) 36.0 - 46.0 %   MCV 88.7 80.0 - 100.0 fL   MCH 27.6 26.0 - 34.0 pg   MCHC 31.2 30.0 - 36.0 g/dL   RDW 12.7 11.5 - 15.5 %   Platelets 408 (H) 150 - 400 K/uL   nRBC 0.0 0.0 - 0.2 %   Neutrophils Relative % 86 %   Neutro Abs 13.4 (H) 1.7 - 7.7 K/uL   Lymphocytes Relative 10 %   Lymphs Abs 1.6 0.7 - 4.0 K/uL   Monocytes Relative 3 %   Monocytes Absolute 0.5 0.1 - 1.0 K/uL   Eosinophils Relative 0 %   Eosinophils Absolute 0.0 0.0 - 0.5 K/uL   Basophils Relative 0 %   Basophils Absolute 0.0 0.0 - 0.1 K/uL   Immature Granulocytes 1 %   Abs Immature Granulocytes 0.07 0.00 - 0.07 K/uL    Comment: Performed at [facility], [address] [place], [place] [address]  Comprehensive metabolic panel      Status: Abnormal   Collection Time: 08/02/19  2:40 AM  Result Value Ref Range   Sodium 141 135 - 145 mmol/L   Potassium 3.6 3.5 - 5.1 mmol/L   Chloride 96 (L) 98 - 111 mmol/L   CO2 24 22 - 32 mmol/L   Glucose, Bld 570 (HH) 70 - 99 mg/dL    Comment: CRITICAL RESULT CALLED TO, READ BACK BY AND VERIFIED WITH: B.SANGALANG,RN 0329 08/02/2019 M.CAMPBELL    BUN 29 (H) 8 - 23 mg/dL   Creatinine, Ser 1.28 (H) 0.44 - 1.00 mg/dL   Calcium 10.0 8.9 - 10.3  mg/dL   Total Protein 8.3 (H) 6.5 - 8.1 g/dL   Albumin 3.6 3.5 - 5.0 g/dL   AST 18 15 - 41 U/L   ALT 18 0 - 44 U/L   Alkaline Phosphatase 135 (H) 38 - 126 U/L   Total Bilirubin 1.4 (H) 0.3 - 1.2 mg/dL   GFR calc non Af Amer 45 (L) >60 mL/min   GFR calc Af Amer 52 (L) >60 mL/min   Anion gap 21 (H) 5 - 15    Comment: Performed at [facility], [address] [place], [place] [address]  CK     Status: None   Collection Time: 08/02/19  2:40 AM  Result Value Ref Range   Total CK 90 38 - 234 U/L    Comment: Performed at [facility], [address], [place], [place] [address]  Troponin I (High Sensitivity)     Status: Abnormal   Collection Time: 08/02/19  2:40 AM  Result Value Ref Range   Troponin I (High Sensitivity) 20 (H) <18 ng/L    Comment: (NOTE) Elevated high sensitivity troponin I (hsTnI) values and significant  changes across serial measurements may suggest ACS but many other  chronic and acute conditions are known to elevate hsTnI results.  Refer to the "Links" section for chest pain algorithms and additional  guidance. Performed at [facility], [address] [place], [place] [address]   Lipase, blood     Status: None   Collection Time: 08/02/19  2:40 AM  Result Value Ref Range   Lipase 18 11 - 51 U/L    Comment: Performed at [facility], [address] [place], [place] [address]  Ethanol     Status: None   Collection Time: 08/02/19  2:55 AM  Result Value Ref Range   Alcohol, Ethyl (B) <10 <10  mg/dL    Comment: (NOTE) Lowest detectable limit for serum alcohol is 10 mg/dL. For medical purposes only. Performed at [facility], [address], [place], [place] [address]   SARS Coronavirus 2 by RT PCR (hospital order, performed in [facility] hospital lab) Nasopharyngeal Nasopharyngeal Swab     Status: None   Collection Time: 08/02/19  3:05 AM   Specimen: Nasopharyngeal Swab  Result Value Ref Range   SARS Coronavirus 2 NEGATIVE NEGATIVE    Comment: (NOTE) If result is NEGATIVE SARS-CoV-2 target nucleic acids are NOT DETECTED. The SARS-CoV-2 RNA is generally detectable in upper and lower  respiratory specimens during the acute phase of infection. The lowest  concentration of SARS-CoV-2 viral copies this assay can detect is 250  copies / mL. A negative result does not preclude SARS-CoV-2 infection  and should not be used as the sole basis for treatment or other  patient management decisions.  A negative result may occur with  improper specimen collection / handling, submission of specimen other  than nasopharyngeal swab, presence of viral mutation(s) within the  areas targeted by this assay, and inadequate number of viral copies  (<250 copies / mL). A negative result must be combined with clinical  observations, patient history, and epidemiological information. If result is POSITIVE SARS-CoV-2 target nucleic acids are DETECTED. The SARS-CoV-2 RNA is generally detectable in upper and lower  respiratory specimens dur ing the acute phase of infection.  Positive  results are indicative of active infection with SARS-CoV-2.  Clinical  correlation with patient history and other diagnostic information is  necessary to determine  patient infection status.  Positive results do  not rule out bacterial infection or co-infection with other viruses. If result is PRESUMPTIVE POSTIVE SARS-CoV-2 nucleic acids MAY BE PRESENT.   A presumptive positive result was obtained on the submitted  specimen  and confirmed on repeat testing.  While 2019 novel coronavirus  (SARS-CoV-2) nucleic acids may be present in the submitted sample  additional confirmatory testing may be necessary for epidemiological  and / or clinical management purposes  to differentiate between  SARS-CoV-2 and other Sarbecovirus currently known to infect humans.  If clinically indicated additional testing with an alternate test  methodology [phone]([id number]) is advised. The SARS-CoV-2 RNA is generally  detectable in upper and lower respiratory sp ecimens during the acute  phase of infection. The expected result is Negative. Fact Sheet for Patients:  BoilerBrush.com.cyhttps://www.fda.gov/media/136312/download Fact Sheet for Healthcare Providers: https://pope.com/https://www.fda.gov/media/136313/download This test is not yet approved or cleared by the [place] FDA and has been authorized for detection and/or diagnosis of SARS-CoV-2 by FDA under an Emergency Use Authorization (EUA).  This EUA will remain in effect (meaning this test can be used) for the duration of the COVID-19 declaration under Section 564(b)(1) of the Act, 21 U.S.C. section 360bbb-3(b)(1), unless the authorization is terminated or revoked sooner. Performed at [facility], [address] [place], [place] [address]   CBG monitoring, ED     Status: Abnormal   Collection Time: 08/02/19  4:33 AM  Result Value Ref Range   Glucose-Capillary 491 (H) 70 - 99 mg/dL  Urinalysis, Routine w reflex microscopic     Status: Abnormal   Collection Time: 08/02/19  5:29 AM  Result Value Ref Range   Color, Urine STRAW (A) YELLOW   APPearance HAZY (A) CLEAR   Specific Gravity, Urine 1.030 1.005 - 1.030   pH 6.0 5.0 - 8.0   Glucose, UA >=500 (A) NEGATIVE mg/dL   Hgb urine dipstick NEGATIVE NEGATIVE   Bilirubin Urine NEGATIVE NEGATIVE   Ketones, ur 20 (A) NEGATIVE mg/dL   Protein, ur 811100 (A) NEGATIVE mg/dL   Nitrite NEGATIVE NEGATIVE   Leukocytes,Ua NEGATIVE NEGATIVE   WBC, UA 0-5  0 - 5 WBC/hpf   Bacteria, UA RARE (A) NONE SEEN   Squamous Epithelial / LPF 0-5 0 - 5   Budding Yeast PRESENT     Comment: Performed at [facility], [address] [place], [place] [address]  Rapid urine drug screen (hospital performed)     Status: None   Collection Time: 08/02/19  5:29 AM  Result Value Ref Range   Opiates NONE DETECTED NONE DETECTED   Cocaine NONE DETECTED NONE DETECTED   Benzodiazepines NONE DETECTED NONE DETECTED   Amphetamines NONE DETECTED NONE DETECTED   Tetrahydrocannabinol NONE DETECTED NONE DETECTED   Barbiturates NONE DETECTED NONE DETECTED    Comment: (NOTE) DRUG SCREEN FOR MEDICAL PURPOSES ONLY.  IF CONFIRMATION IS NEEDED FOR ANY PURPOSE, NOTIFY LAB WITHIN 5 DAYS. LOWEST DETECTABLE LIMITS FOR URINE DRUG SCREEN Drug Class                     Cutoff (ng/mL) Amphetamine and metabolites    1000 Barbiturate and metabolites    200 Benzodiazepine                 200 Tricyclics and metabolites     300 Opiates and metabolites        300 Cocaine and metabolites        300 THC  50 Performed at [facility], [address], [place], [place] [address]   Troponin I (High Sensitivity)     Status: Abnormal   Collection Time: 08/02/19  5:29 AM  Result Value Ref Range   Troponin I (High Sensitivity) 19 (H) <18 ng/L    Comment: (NOTE) Elevated high sensitivity troponin I (hsTnI) values and significant  changes across serial measurements may suggest ACS but many other  chronic and acute conditions are known to elevate hsTnI results.  Refer to the "Links" section for chest pain algorithms and additional  guidance. Performed at [facility], [address], [place], [place] [address]   POCT I-Stat EG7     Status: Abnormal   Collection Time: 08/02/19  5:58 AM  Result Value Ref Range   pH, Ven 7.406 7.250 - 7.430   pCO2, Ven 40.8 (L) 44.0 - 60.0 mmHg   pO2, Ven 45.0 32.0 - 45.0 mmHg   Bicarbonate 25.7 20.0 - 28.0  mmol/L   TCO2 27 22 - 32 mmol/L   O2 Saturation 81.0 %   Acid-Base Excess 1.0 0.0 - 2.0 mmol/L   Sodium 144 135 - 145 mmol/L   Potassium 3.5 3.5 - 5.1 mmol/L   Calcium, Ion 1.14 (L) 1.15 - 1.40 mmol/L   HCT 43.0 36.0 - 46.0 %   Hemoglobin 14.6 12.0 - 15.0 g/dL   Patient temperature HIDE    Sample type VENOUS   CBG monitoring, ED     Status: Abnormal   Collection Time: 08/02/19  6:24 AM  Result Value Ref Range   Glucose-Capillary 413 (H) 70 - 99 mg/dL  Lactic acid, plasma     Status: None   Collection Time: 08/02/19  6:35 AM  Result Value Ref Range   Lactic Acid, Venous 1.7 0.5 - 1.9 mmol/L    Comment: Performed at [facility], [address] [place], [place] [address]  CBG monitoring, ED     Status: Abnormal   Collection Time: 08/02/19  7:29 AM  Result Value Ref Range   Glucose-Capillary 370 (H) 70 - 99 mg/dL   Comment 1 Document in Chart    Dg Chest 1 View  Result Date: 08/02/2019 CLINICAL DATA:  Found on the floor EXAM: CHEST  1 VIEW COMPARISON:  None. FINDINGS: The heart size and mediastinal contours are within normal limits. Both lungs are clear. Bilateral shoulder osteoarthritis. IMPRESSION: No acute cardiopulmonary process. Electronically Signed   By: Jonna Clark M.D.   On: 08/02/2019 03:48   Dg Lumbar Spine Complete  Result Date: 08/02/2019 CLINICAL DATA:  Fall, midline pain EXAM: LUMBAR SPINE - COMPLETE 4+ VIEW COMPARISON:  None. FINDINGS: There is no evidence of lumbar spine fracture. Alignment is normal. Mild disc height loss with small anterior osteophytes seen in the lower lumbar spine IMPRESSION: No acute fracture or malalignment. Electronically Signed   By: Jonna Clark M.D.   On: 08/02/2019 03:49   Ct Head Wo Contrast  Result Date: 08/02/2019 CLINICAL DATA:  Found on floor after fall EXAM: CT HEAD WITHOUT CONTRAST CT CERVICAL SPINE WITHOUT CONTRAST TECHNIQUE: Multidetector CT imaging of the head and cervical spine was performed following the standard  protocol without intravenous contrast. Multiplanar CT image reconstructions of the cervical spine were also generated. COMPARISON:  None. FINDINGS: CT HEAD FINDINGS Brain: Low-density throughout the left superior cerebellum, recent appearing. No evidence of hemorrhage or contusion. Remote lacunar infarct at the anterior right thalamus. Mild low-density in the cerebral white  matter. Perisylvian predominant brain atrophy. No hydrocephalus Vascular: No hyperdense vessel Skull: Negative for fracture Sinuses/Orbits: Bilateral cataract resection CT CERVICAL SPINE FINDINGS Alignment: No traumatic malalignment Skull base and vertebrae: Negative for fracture Soft tissues and spinal canal: No prevertebral fluid or swelling. No visible canal hematoma. Disc levels: Generalized degenerative disc narrowing and ridging with mild posterior longitudinal ligament ossification. Spurring causes a generalized narrow appearance of the spinal canal. Upper chest: Negative IMPRESSION: 1. Recent appearing left superior cerebellar artery infarct. 2. No intracranial hemorrhage or cervical spine fracture. Electronically Signed   By: Marnee Spring M.D.   On: 08/02/2019 04:22   Ct Cervical Spine Wo Contrast  Result Date: 08/02/2019 CLINICAL DATA:  Found on floor after fall EXAM: CT HEAD WITHOUT CONTRAST CT CERVICAL SPINE WITHOUT CONTRAST TECHNIQUE: Multidetector CT imaging of the head and cervical spine was performed following the standard protocol without intravenous contrast. Multiplanar CT image reconstructions of the cervical spine were also generated. COMPARISON:  None. FINDINGS: CT HEAD FINDINGS Brain: Low-density throughout the left superior cerebellum, recent appearing. No evidence of hemorrhage or contusion. Remote lacunar infarct at the anterior right thalamus. Mild low-density in the cerebral white matter. Perisylvian predominant brain atrophy. No hydrocephalus Vascular: No hyperdense vessel Skull: Negative for fracture  Sinuses/Orbits: Bilateral cataract resection CT CERVICAL SPINE FINDINGS Alignment: No traumatic malalignment Skull base and vertebrae: Negative for fracture Soft tissues and spinal canal: No prevertebral fluid or swelling. No visible canal hematoma. Disc levels: Generalized degenerative disc narrowing and ridging with mild posterior longitudinal ligament ossification. Spurring causes a generalized narrow appearance of the spinal canal. Upper chest: Negative IMPRESSION: 1. Recent appearing left superior cerebellar artery infarct. 2. No intracranial hemorrhage or cervical spine fracture. Electronically Signed   By: Marnee Spring M.D.   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  1. No significant mass effect associated with the stroke. 2. Exam reveals lethargy, most likely secondary to nausea and metabolic disturbance as well as decreased PO intake secondary to nausea with vomiting at home.  3. Stroke Risk Factors - DM, HLD and HTN 4. Chronic LBP. The patient is currently complaining of LBP. L-spine plain films reveal no acute fracture or malalignment. 5. No fracture on CT of cervical spine.   Plan: 1. HgbA1c, fasting lipid panel 2. MRI, MRA of the brain without contrast 3. PT consult, OT consult, Speech consult 4. Echocardiogram 5. Carotid dopplers 6. Prophylactic therapy- Start ASA 7. Risk factor modification 8. Telemetry monitoring 9. Frequent neuro checks 10. BP management. Out of permissive HTN time window 11. Management of hyperglycemia and volume depletion per primary team.  12. Pharmacological management of LBP. May need plain films of T-spine to rule out acute fracture 13. Start treatment with a statin    signed: Dr. Caryl Pina 08/02/2019, 7:45 AM

## 2019-08-02 NOTE — H&P (Signed)
History and Physical    Lauren Waller ZOX:[id number] DOB: Feb 28, 1956 DOA: 08/02/2019  PCP: Jackie Plumsei-Bonsu, George, MD Consultants:  Rosemary HolmsPatwardhan - cardiology; Stacie AcresMayer - podiatry Patient coming from:  Home - lives alone; NOK: Niece, Jeanella Antonicola Harris, [phone]  Chief Complaint: Falls  HPI: Lauren Waller is a 63 y.o. female with medical history significant of HTN, HLD, and DM presenting with falls.  She reports vomiting and every time she gets up she falls down.  Symptoms started 3 days ago.  +light-headed/dizzy.  No dysphagia.  No dysarthria.  No N/W/T of arms or legs.  No headache.  No fever.  She had complained of n/v and possibly diarrhea to her niece.  She has been drinking a lot of ginger ale to try to compensate for poor appetite and has not been taking her insulin.  She has a nurse to come check on her at home 3 times a week but otherwise lives independently.    ED Course: Off balance, falls, cerebellar stroke.  Has hyperglycemia with anion gap and normal bicarb - placed on insulin drip.  Normal pH.  Review of Systems: As per HPI; otherwise review of systems reviewed and negative.   Ambulatory Status:  Ambulates with a cane  Past Medical History:  Diagnosis Date   Chronic lower back pain    Depression    Facial cellulitis 04/17/2018   HLD (hyperlipidemia)    Hypertension    Type II diabetes mellitus (HCC)     Past Surgical History:  Procedure Laterality Date   CATARACT EXTRACTION W/ INTRAOCULAR LENS  IMPLANT, BILATERAL Bilateral    EYE SURGERY Bilateral    2016   fatty tissue removal from upper back     1998   INGUINAL HERNIA REPAIR Left    MYOMECTOMY     ROBOTIC ASSISTED LAPAROSCOPIC VAGINAL HYSTERECTOMY WITH FIBROID REMOVAL     2009    Social History   Socioeconomic History   Marital status: Divorced    Spouse name: Not on file   Number of children: Not on file   Years of education: Not on file   Highest education level: Not on file   Occupational History   Occupation: unemployed  [employer]   Financial resource strain: Not on file   Food insecurity    Worry: Not on file    Inability: Not on file   Transportation needs    Medical: Not on file    Non-medical: Not on file  Tobacco Use   Smoking status: Never Smoker   Smokeless tobacco: Never Used  Substance and Sexual Activity   Alcohol use: Not Currently   Drug use: Never   Sexual activity: Not on file  Lifestyle   Physical activity    Days per week: Not on file    Minutes per session: Not on file   Stress: Not on file  Relationships   Social connections    Talks on phone: Not on file    Gets together: Not on file    Attends religious service: Not on file    Active member of club or organization: Not on file    Attends meetings of clubs or organizations: Not on file    Relationship status: Not on file   Intimate partner violence    Fear of current or ex partner: Not on file    Emotionally abused: Not on file    Physically abused: Not on file    Forced sexual activity: Not on file  Other Topics Concern  Not on file  Social History Narrative   Not on file    Allergies  Allergen Reactions   Januvia [Sitagliptin] Swelling   Metformin And Related Nausea And Vomiting    Family History  Problem Relation Age of Onset   Hypertension Mother    Pancreatic cancer Brother    Stroke Neg Hx     Prior to Admission medications   Medication Sig Start Date End Date Taking? Authorizing Provider  amLODipine (NORVASC) 10 MG tablet Take 10 mg by mouth daily. 03/23/18   [provider]  atenolol (TENORMIN) 100 MG tablet Take 1 tablet by mouth daily 12/20/18   Patwardhan, Manish J, MD  benazepril-hydrochlorthiazide (LOTENSIN HCT) 20-25 MG tablet Take 1 tablet by mouth daily. 03/15/18   [provider]  diclofenac sodium (VOLTAREN) 1 % GEL Apply 2 g topically 4 (four) times daily as needed (joint pain).     [provider]  fluticasone (FLONASE) 50 MCG/ACT nasal spray Place into both nostrils daily.    [provider]  hydrALAZINE (APRESOLINE) 100 MG tablet Take 100 mg by mouth 2 (two) times daily. 03/14/18   [provider]  ibuprofen (ADVIL,MOTRIN) 600 MG tablet Take 1 tablet (600 mg total) by mouth every 6 (six) hours as needed. Patient not taking: Reported on 08/29/2017 02/10/17   Nona Dell, PA-C  insulin glargine (LANTUS) 100 UNIT/ML injection Inject 0.15 mLs (15 Units total) into the skin at bedtime. OK to dispense pen if covered. Please dispense syringes / needles as well. 04/21/18   Caren Griffins, MD  Omega-3-acid Ethyl Esters (LOVAZA PO) Take by mouth.    [provider]  oxyCODONE-acetaminophen (PERCOCET/ROXICET) 5-325 MG tablet Take 1 tablet by mouth every 4 (four) hours as needed for severe pain. 04/21/18   Caren Griffins, MD    Physical Exam: Vitals:   08/02/19 0830 08/02/19 0930 08/02/19 1000 08/02/19 1030  BP: (!) 144/77 (!) 167/78 (!) 198/89 (!) 167/78  Pulse: 73 68 79 67  Resp: 18 20 (!) 5 15  Temp:      TempSrc:      SpO2: 97% 98% 97% 98%      General:  Appears calm and comfortable and is NAD but is quite somnolent  Eyes:  PERRL, EOMI, normal lids, iris  ENT:  grossly normal hearing, lips & tongue, mmm  Neck:  no LAD, masses or thyromegaly; no carotid bruits  Cardiovascular:  RRR, no m/r/g. No LE edema.   Respiratory:   CTA bilaterally with no wheezes/rales/rhonchi.  Normal respiratory effort.  Abdomen:  soft, NT, ND, NABS  Back:   normal alignment, no CVAT  Skin:  no rash or induration seen on limited exam  Musculoskeletal:  grossly normal tone BUE/BLE, good ROM, no bony abnormality  Psychiatric:  blunted mood and affect, speech fluent and appropriate, AOx3  Neurologic:  CN 2-12 grossly intact, moves all extremities in coordinated fashion, sensation intact    Radiological Exams on Admission: Dg Chest 1  View  Result Date: 08/02/2019 CLINICAL DATA:  Found on the floor EXAM: CHEST  1 VIEW COMPARISON:  None. FINDINGS: The heart size and mediastinal contours are within normal limits. Both lungs are clear. Bilateral shoulder osteoarthritis. IMPRESSION: No acute cardiopulmonary process. Electronically Signed   By: Prudencio Pair M.D.   On: 08/02/2019 03:48   Dg Lumbar Spine Complete  Result Date: 08/02/2019 CLINICAL DATA:  Fall, midline pain EXAM: LUMBAR SPINE - COMPLETE 4+ VIEW COMPARISON:  None. FINDINGS:  There is no evidence of lumbar spine fracture. Alignment is normal. Mild disc height loss with small anterior osteophytes seen in the lower lumbar spine IMPRESSION: No acute fracture or malalignment. Electronically Signed   By: Jonna Clark M.D.   On: 08/02/2019 03:49   Ct Head Wo Contrast  Result Date: 08/02/2019 CLINICAL DATA:  Found on floor after fall EXAM: CT HEAD WITHOUT CONTRAST CT CERVICAL SPINE WITHOUT CONTRAST TECHNIQUE: Multidetector CT imaging of the head and cervical spine was performed following the standard protocol without intravenous contrast. Multiplanar CT image reconstructions of the cervical spine were also generated. COMPARISON:  None. FINDINGS: CT HEAD FINDINGS Brain: Low-density throughout the left superior cerebellum, recent appearing. No evidence of hemorrhage or contusion. Remote lacunar infarct at the anterior right thalamus. Mild low-density in the cerebral white matter. Perisylvian predominant brain atrophy. No hydrocephalus Vascular: No hyperdense vessel Skull: Negative for fracture Sinuses/Orbits: Bilateral cataract resection CT CERVICAL SPINE FINDINGS Alignment: No traumatic malalignment Skull base and vertebrae: Negative for fracture Soft tissues and spinal canal: No prevertebral fluid or swelling. No visible canal hematoma. Disc levels: Generalized degenerative disc narrowing and ridging with mild posterior longitudinal ligament ossification. Spurring causes a generalized  narrow appearance of the spinal canal. Upper chest: Negative IMPRESSION: 1. Recent appearing left superior cerebellar artery infarct. 2. No intracranial hemorrhage or cervical spine fracture. Electronically Signed   By: Marnee Spring M.D.   On: 08/02/2019 04:22   Ct Cervical Spine Wo Contrast  Result Date: 08/02/2019 CLINICAL DATA:  Found on floor after fall EXAM: CT HEAD WITHOUT CONTRAST CT CERVICAL SPINE WITHOUT CONTRAST TECHNIQUE: Multidetector CT imaging of the head and cervical spine was performed following the standard protocol without intravenous contrast. Multiplanar CT image reconstructions of the cervical spine were also generated. COMPARISON:  None. FINDINGS: CT HEAD FINDINGS Brain: Low-density throughout the left superior cerebellum, recent appearing. No evidence of hemorrhage or contusion. Remote lacunar infarct at the anterior right thalamus. Mild low-density in the cerebral white matter. Perisylvian predominant brain atrophy. No hydrocephalus Vascular: No hyperdense vessel Skull: Negative for fracture Sinuses/Orbits: Bilateral cataract resection CT CERVICAL SPINE FINDINGS Alignment: No traumatic malalignment Skull base and vertebrae: Negative for fracture Soft tissues and spinal canal: No prevertebral fluid or swelling. No visible canal hematoma. Disc levels: Generalized degenerative disc narrowing and ridging with mild posterior longitudinal ligament ossification. Spurring causes a generalized narrow appearance of the spinal canal. Upper chest: Negative IMPRESSION: 1. Recent appearing left superior cerebellar artery infarct. 2. No intracranial hemorrhage or cervical spine fracture. Electronically Signed   By: Marnee Spring M.D.   On: 08/02/2019 04:22    EKG: Independently reviewed.  Sinus tachycardia with rate 100; nonspecific ST changes with no evidence of acute ischemia; NSCSLT   Labs on Admission: I have personally reviewed the available labs and imaging studies at the time of the  admission.  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 Active Problems:   Diabetes mellitus without complication (HCC)   Hypertension   HLD (hyperlipidemia)    Cerebellar CVA -Patient presenting with ataxia concerning for CVA -CT confirmed a cerebellar stroke -Will admit for further evaluation -Telemetry monitoring -MRI/MRA -Carotid dopplers -Echo -Risk stratification with FLP,  A1c; will also check TSH and UDS -ASA daily -Neurology consult -PT/OT/ST/Nutrition Consults  HTN -Allow permissive HTN for now -Treat BP only if >220/120, and then with goal of 15% reduction -Hold PO home meds (Norvasc, Atenolol, Hydralazine) and plan to restart in 48-72 hours   HLD -Check FLP -Resume statin increase Lipitor to 40 mg daily   DM -Prior A1c from 2019 was 11.0, indicating very poor control -Interestingly, she does not appear to have any medications including insulin listed on her Med Rec form -Patient was started on the Glcuostabilizer protocol overnight, but in review of her chart she is not in DKA and appears to have chronically poor DM control -Insulin drip was cancelled and patient was given Lantus 15 units instead -Will continue ongoing IVF at 150 cc/hr for now since the patient did appear to be dry -Will order moderate-scale SSI including qhs coverage    Note: This patient has been tested and is negative for the novel coronavirus COVID-19.    DVT prophylaxis:  Lovenox  Code Status: Full - confirmed with patient/family Family Communication: None present; I spoke with her niece by telephone Disposition Plan:  Home once clinically improved Consults  called: Neurology; PT/OT/ST/Nutrition  Admission status: Admit - It is my clinical opinion that admission to INPATIENT is reasonable and necessary because of the expectation that this patient will require hospital care that crosses at least 2 midnights to treat this condition based on the medical complexity of the problems presented.  Given the aforementioned information, the predictability of an adverse outcome is felt to be significant.    Jonah Blue MD [facility]   How to contact the [facility] Attending or Consulting provider 7A - 7P or covering provider during after hours 7P -7A, for this patient?  1. Check the care team in [facility] and look for a) attending/consulting [facility] provider listed and b) the [facility] team listed 2. Log into [email or web address] and use [facility] universal password to access. If you do not have the password, please contact the hospital operator. 3. Locate the [facility] provider you are looking for under [facility] and page to a number that you can be directly reached. 4. If you still have difficulty reaching the provider, please page the [facility] (Director on Call) for the Hospitalists listed on amion for assistance.   08/02/2019, 12:03 PM

## 2019-08-02 NOTE — ED Provider Notes (Signed)
[facility] EMERGENCY DEPARTMENT Provider Note   CSN: [id number] Arrival date & time: 08/02/19  0207     History   Chief Complaint Chief Complaint  Patient presents with   Fall   Hyperglycemia    HPI Lauren Waller is a 63 y.o. female with a hx of chronic low back pain, depression, hypertension, insulin-dependent diabetes presents to the Emergency Department complaining of acute, persistent dizziness with recurrent falls onset approximately 2 days ago.  Patient reports every time she stands up she feels as if the room is spinning.  Additionally she has had nausea and vomiting.  Patient reports due to this, she has been unable to take any of her medications for her hypertension or diabetes.  She reports that tonight she fell out of bed and laid on the floor for several hours.  She reports when EMS arrived her blood sugar was found to be high.  She reports feeling very thirsty.  She has no history of CVA and is not anticoagulated.  She denies recent illness or known sick contacts.  She denies fever, chills.  Patient reports she can normally walk without difficulty.  Patient is slow to answer questions and seems to have a difficulty with an accurate timeline.  Level 5 caveat for altered mental status.     The history is provided by the patient and medical records. No language interpreter was used.    Past Medical History:  Diagnosis Date   Chronic lower back pain    Depression    Facial cellulitis 04/17/2018   HLD (hyperlipidemia)    Hypertension    Type II diabetes mellitus [facility])     Patient Active Problem List   Diagnosis Date Noted   HLD (hyperlipidemia) 04/17/2018   Hypertensive urgency 04/17/2018   Facial cellulitis 04/17/2018   Sepsis (HCC) 04/17/2018   Diabetes mellitus without complication (HCC)    Hypertension    Varicose veins of bilateral lower extremities with other complications 08/29/2017    Past Surgical History:   Procedure Laterality Date   CATARACT EXTRACTION W/ INTRAOCULAR LENS  IMPLANT, BILATERAL Bilateral    EYE SURGERY Bilateral    2016   fatty tissue removal from upper back     1998   INGUINAL HERNIA REPAIR Left    MYOMECTOMY     ROBOTIC ASSISTED LAPAROSCOPIC VAGINAL HYSTERECTOMY WITH FIBROID REMOVAL     2009     OB History   No obstetric history on file.      Home Medications    Prior to Admission medications   Medication Sig Start Date End Date Taking? Authorizing Provider  amLODipine (NORVASC) 10 MG tablet Take 10 mg by mouth daily. 03/23/18   [provider]  atenolol (TENORMIN) 100 MG tablet Take 1 tablet by mouth daily 12/20/18   Patwardhan, Manish J, MD  benazepril-hydrochlorthiazide (LOTENSIN HCT) 20-25 MG tablet Take 1 tablet by mouth daily. 03/15/18   [provider]  diclofenac sodium (VOLTAREN) 1 % GEL Apply 2 g topically 4 (four) times daily as needed (joint pain).     [provider]  fluticasone (FLONASE) 50 MCG/ACT nasal spray Place into both nostrils daily.    [provider]  hydrALAZINE (APRESOLINE) 100 MG tablet Take 100 mg by mouth 2 (two) times daily. 03/14/18   [provider]  ibuprofen (ADVIL,MOTRIN) 600 MG tablet Take 1 tablet (600 mg total) by mouth every 6 (six) hours as needed. Patient not taking: Reported on 08/29/2017 02/10/17  Barrett Henle, PA-C  insulin glargine (LANTUS) 100 UNIT/ML injection Inject 0.15 mLs (15 Units total) into the skin at bedtime. OK to dispense pen if covered. Please dispense syringes / needles as well. 04/21/18   Leatha Gilding, MD  Omega-3-acid Ethyl Esters (LOVAZA PO) Take by mouth.    [provider]  oxyCODONE-acetaminophen (PERCOCET/ROXICET) 5-325 MG tablet Take 1 tablet by mouth every 4 (four) hours as needed for severe pain. 04/21/18   Leatha Gilding, MD    Family History Family History  Problem Relation Age of Onset   Hypertension Mother     Pancreatic cancer Brother     Social History Social History   Tobacco Use   Smoking status: Never Smoker   Smokeless tobacco: Never Used  Substance Use Topics   Alcohol use: Not Currently   Drug use: Never     Allergies   Januvia [sitagliptin] and Metformin and related   Review of Systems Review of Systems  Unable to perform ROS: Mental status change  Constitutional: Positive for fatigue.  Neurological: Positive for dizziness and weakness.     Physical Exam Updated Vital Signs BP (!) 209/105    Pulse 98    Temp 98.5 F (36.9 C) (Oral)    Resp 16    SpO2 97%   Physical Exam Vitals signs and nursing note reviewed.  Constitutional:      General: She is not in acute distress.    Appearance: She is not diaphoretic.  HENT:     Head: Normocephalic.     Mouth/Throat:     Mouth: Mucous membranes are dry.  Eyes:     General: No scleral icterus.    Conjunctiva/sclera: Conjunctivae normal.  Neck:     Musculoskeletal: Normal range of motion.     Comments: No midline or paraspinal tenderness. Cardiovascular:     Rate and Rhythm: Normal rate and regular rhythm.     Pulses: Normal pulses.          Radial pulses are 2+ on the right side and 2+ on the left side.       Dorsalis pedis pulses are 2+ on the right side and 2+ on the left side.  Pulmonary:     Effort: No tachypnea, accessory muscle usage, prolonged expiration, respiratory distress or retractions.     Breath sounds: No stridor.     Comments: Equal chest rise. No increased work of breathing. Abdominal:     General: There is no distension.     Palpations: Abdomen is soft.     Tenderness: There is no abdominal tenderness. There is no guarding or rebound.  Musculoskeletal:     Lumbar back: She exhibits tenderness and pain.     Comments: Moves all extremities equally. Significant midline tenderness to palpation of the L-spine.  Skin:    General: Skin is warm and dry.     Capillary Refill: Capillary refill  takes less than 2 seconds.  Neurological:     Mental Status: She is alert.     GCS: GCS eye subscore is 4. GCS verbal subscore is 5. GCS motor subscore is 6.     Comments: Mental Status:  Speech is somewhat garbled and difficult to understand. Able to follow 1 step commands without difficulty, but unable to follow two-step commands.  Cranial Nerves:  II:  pupils equal, round, reactive to light III,IV, VI: ptosis not present, extra-ocular motions intact bilaterally  V,VII: smile symmetric, facial light touch sensation equal VIII: hearing  grossly normal to voice  X: uvula elevates symmetrically  XI: bilateral shoulder shrug symmetric and strong XII: midline tongue extension without fassiculations Motor:  Normal tone. 5/5 in upper and lower extremities bilaterally including strong and equal grip strength and dorsiflexion/plantar flexion Sensory: Light touch normal in all extremities.  Cerebellar: normal finger-to-nose with right upper extremity but patient seems to have a bit of difficulty and initial past-pointing with the left upper extremity Gait: Gait testing deferred given her recurrent falls and severe dizziness CV: distal pulses palpable throughout   Psychiatric:        Mood and Affect: Mood normal.      ED Treatments / Results  Labs (all labs ordered are listed, but only abnormal results are displayed) Labs Reviewed  CBC WITH DIFFERENTIAL/PLATELET - Abnormal; Notable for the following components:      Result Value   WBC 15.6 (*)    RBC 5.39 (*)    HCT 47.8 (*)    Platelets 408 (*)    Neutro Abs 13.4 (*)    All other components within normal limits  COMPREHENSIVE METABOLIC PANEL - Abnormal; Notable for the following components:   Chloride 96 (*)    Glucose, Bld 570 (*)    BUN 29 (*)    Creatinine, Ser 1.28 (*)    Total Protein 8.3 (*)    Alkaline Phosphatase 135 (*)    Total Bilirubin 1.4 (*)    GFR calc non Af Amer 45 (*)    GFR calc Af Amer 52 (*)    Anion gap  21 (*)    All other components within normal limits  URINALYSIS, ROUTINE W REFLEX MICROSCOPIC - Abnormal; Notable for the following components:   Color, Urine STRAW (*)    APPearance HAZY (*)    Glucose, UA >=500 (*)    Ketones, ur 20 (*)    Protein, ur 100 (*)    Bacteria, UA RARE (*)    All other components within normal limits  CBG MONITORING, ED - Abnormal; Notable for the following components:   Glucose-Capillary 596 (*)    All other components within normal limits  CBG MONITORING, ED - Abnormal; Notable for the following components:   Glucose-Capillary 491 (*)    All other components within normal limits  POCT I-STAT EG7 - Abnormal; Notable for the following components:   pCO2, Ven 40.8 (*)    Calcium, Ion 1.14 (*)    All other components within normal limits  CBG MONITORING, ED - Abnormal; Notable for the following components:   Glucose-Capillary 413 (*)    All other components within normal limits  CBG MONITORING, ED - Abnormal; Notable for the following components:   Glucose-Capillary 370 (*)    All other components within normal limits  TROPONIN I (HIGH SENSITIVITY) - Abnormal; Notable for the following components:   Troponin I (High Sensitivity) 20 (*)    All other components within normal limits  TROPONIN I (HIGH SENSITIVITY) - Abnormal; Notable for the following components:   Troponin I (High Sensitivity) 19 (*)    All other components within normal limits  SARS CORONAVIRUS 2 BY RT PCR (HOSPITAL ORDER, PERFORMED IN [place] [facility])  CK  ETHANOL  RAPID URINE DRUG SCREEN, HOSP PERFORMED  LIPASE, BLOOD  LACTIC ACID, PLASMA  BLOOD GAS, VENOUS  LACTIC ACID, PLASMA    EKG EKG Interpretation  Date/Time:  Friday August 02 2019 03:03:58 EDT Ventricular Rate:  100 PR Interval:    QRS Duration:  92 QT Interval:  367 QTC Calculation: 474 R Axis:   -33 Text Interpretation:  Sinus tachycardia Prominent P waves, nondiagnostic Abnormal R-wave progression,  late transition Left ventricular hypertrophy Nonspecific T abnormalities, lateral leads When compared with ECG of 04/17/2018, No significant change was found Confirmed by Dione Booze ([id number]) on 08/02/2019 3:10:38 AM   Radiology Dg Chest 1 View  Result Date: 08/02/2019 CLINICAL DATA:  Found on the floor EXAM: CHEST  1 VIEW COMPARISON:  None. FINDINGS: The heart size and mediastinal contours are within normal limits. Both lungs are clear. Bilateral shoulder osteoarthritis. IMPRESSION: No acute cardiopulmonary process. Electronically Signed   By: Jonna Clark M.D.   On: 08/02/2019 03:48   Dg Lumbar Spine Complete  Result Date: 08/02/2019 CLINICAL DATA:  Fall, midline pain EXAM: LUMBAR SPINE - COMPLETE 4+ VIEW COMPARISON:  None. FINDINGS: There is no evidence of lumbar spine fracture. Alignment is normal. Mild disc height loss with small anterior osteophytes seen in the lower lumbar spine IMPRESSION: No acute fracture or malalignment. Electronically Signed   By: Jonna Clark M.D.   On: 08/02/2019 03:49   Ct Head Wo Contrast  Result Date: 08/02/2019 CLINICAL DATA:  Found on floor after fall EXAM: CT HEAD WITHOUT CONTRAST CT CERVICAL SPINE WITHOUT CONTRAST TECHNIQUE: Multidetector CT imaging of the head and cervical spine was performed following the standard protocol without intravenous contrast. Multiplanar CT image reconstructions of the cervical spine were also generated. COMPARISON:  None. FINDINGS: CT HEAD FINDINGS Brain: Low-density throughout the left superior cerebellum, recent appearing. No evidence of hemorrhage or contusion. Remote lacunar infarct at the anterior right thalamus. Mild low-density in the cerebral white matter. Perisylvian predominant brain atrophy. No hydrocephalus Vascular: No hyperdense vessel Skull: Negative for fracture Sinuses/Orbits: Bilateral cataract resection CT CERVICAL SPINE FINDINGS Alignment: No traumatic malalignment Skull base and vertebrae: Negative for fracture Soft  tissues and spinal canal: No prevertebral fluid or swelling. No visible canal hematoma. Disc levels: Generalized degenerative disc narrowing and ridging with mild posterior longitudinal ligament ossification. Spurring causes a generalized narrow appearance of the spinal canal. Upper chest: Negative IMPRESSION: 1. Recent appearing left superior cerebellar artery infarct. 2. No intracranial hemorrhage or cervical spine fracture. Electronically Signed   By: Marnee Spring M.D.   On: 08/02/2019 04:22   Ct Cervical Spine Wo Contrast  Result Date: 08/02/2019 CLINICAL DATA:  Found on floor after fall EXAM: CT HEAD WITHOUT CONTRAST CT CERVICAL SPINE WITHOUT CONTRAST TECHNIQUE: Multidetector CT imaging of the head and cervical spine was performed following the standard protocol without intravenous contrast. Multiplanar CT image reconstructions of the cervical spine were also generated. COMPARISON:  None. FINDINGS: CT HEAD FINDINGS Brain: Low-density throughout the left superior cerebellum, recent appearing. No evidence of hemorrhage or contusion. Remote lacunar infarct at the anterior right thalamus. Mild low-density in the cerebral white matter. Perisylvian predominant brain atrophy. No hydrocephalus Vascular: No hyperdense vessel Skull: Negative for fracture Sinuses/Orbits: Bilateral cataract resection CT CERVICAL SPINE FINDINGS Alignment: No traumatic malalignment Skull base and vertebrae: Negative for fracture Soft tissues and spinal canal: No prevertebral fluid or swelling. No visible canal hematoma. Disc levels: Generalized degenerative disc narrowing and ridging with mild posterior longitudinal ligament ossification. Spurring causes a generalized narrow appearance of the spinal canal. Upper chest: Negative IMPRESSION: 1. Recent appearing left superior cerebellar artery infarct. 2. No intracranial hemorrhage or cervical spine fracture. Electronically Signed   By: Marnee Spring M.D.   On: 08/02/2019 04:22     Procedures .Critical Care  Performed by: Dierdre Forth, PA-C Authorized by: Dierdre Forth, PA-C   Critical care provider statement:    Critical care time (minutes):  45   Critical care time was exclusive of:  Separately billable procedures and treating other patients and teaching time   Critical care was necessary to treat or prevent imminent or life-threatening deterioration of the following conditions:  Endocrine crisis and CNS failure or compromise   Critical care was time spent personally by me on the following activities:  Discussions with consultants, evaluation of patient's response to treatment, examination of patient, ordering and performing treatments and interventions, ordering and review of laboratory studies, ordering and review of radiographic studies, pulse oximetry, re-evaluation of patient's condition, obtaining history from patient or surrogate and review of old charts   I assumed direction of critical care for this patient from another provider in my specialty: no     (including critical care time)  Medications Ordered in ED Medications  sodium chloride flush (NS) 0.9 % injection 3 mL (3 mLs Intravenous Not Given 08/02/19 0306)  benazepril (LOTENSIN) tablet 20 mg (has no administration in time range)  hydrochlorothiazide (HYDRODIURIL) tablet 25 mg (has no administration in time range)  dextrose 5 %-0.45 % sodium chloride infusion ( Intravenous Not Given 08/02/19 0510)  insulin regular, human (MYXREDLIN) 100 units/ 100 mL infusion (6.2 Units/hr Intravenous Rate/Dose Change 08/02/19 0730)  labetalol (NORMODYNE) injection 10 mg (10 mg Intravenous Given 08/02/19 0612)  hydrALAZINE (APRESOLINE) tablet 100 mg (100 mg Oral Given 08/02/19 0314)  sodium chloride 0.9 % bolus 500 mL (0 mLs Intravenous Stopped 08/02/19 0425)  sodium chloride 0.9 % bolus 500 mL (0 mLs Intravenous Stopped 08/02/19 0538)  amLODipine (NORVASC) tablet 10 mg (10 mg Oral Given 08/02/19 0509)   atenolol (TENORMIN) tablet 100 mg (100 mg Oral Given 08/02/19 0509)  ondansetron (ZOFRAN) injection 4 mg (4 mg Intravenous Given 08/02/19 0508)     Initial Impression / Assessment and Plan / ED Course  I have reviewed the triage vital signs and the nursing notes.  Pertinent labs & imaging results that were available during my care of the patient were reviewed by me and considered in my medical decision making (see chart for details).  Clinical Course as of Aug 01 737  Fri Aug 02, 2019  0604 Pt remains hypertensive.  IV labetalol ordered for SBP goal of 180  BP(!): 209/93 [HM]    Clinical Course User Index [HM] Antoni Stefan, Boyd Kerbs        Presents to the emergency department with reports of recurrent falls, persistent and constant dizziness and hyperglycemia.  She is found to be significantly hypertensive with her first blood pressure at 233/128.  Patient's speech seems to be a bit garbled and she has some difficulty with finger-to-nose of her left hand but there is no unilateral weakness on her neurologic exam.  No specific facial droop.  She keeps her eyes closed due to severe dizziness.  Initial concern for DKA given her elevated glucose and anion gap however bicarbonate and pH are within normal limits.  Alcohol is negative.  Pt was placed on glucostabilizer.  Lactic acid and urinalysis pending.  CK within normal limits.  No evidence of rhabdomyolysis.  COVID is negative.  Patient does have elevated troponin initially at 20 with repeat at 19.  Suspect this is secondary to hypertensive urgency.  Patient initially given p.o. medications for her hypertension however CT scan shows recent superior cerebellar infarct.  Given this finding more aggressive blood  pressure management was undertaken with labetalol.  Chest x-ray without evidence of consolidation, pneumothorax, pulmonary edema or groundglass opacities.  Patient did have significant back pain however no evidence of compression  fracture on plain film.  She moves her legs without difficulty and has good strength in the bilateral lower extremities.  Patient discussed with Dr. Cheral Marker of neurology who will evaluate.  Patient will be admitted to the hospitalist service.  The patient was discussed with and seen by Dr. Roxanne Mins who agrees with the treatment plan.   Final Clinical Impressions(s) / ED Diagnoses   Final diagnoses:  Hyperglycemia  Cerebrovascular accident (CVA), unspecified mechanism ([place])  Elevated troponin  Hypertensive urgency    ED Discharge Orders    None       Loni Muse Gwenlyn Perking 62/56/38 [id number]    Delora Fuel, MD 42/87/68 [phone]

## 2019-08-02 NOTE — ED Notes (Signed)
PA notified on patient's persistent hypertension and CBG result.

## 2019-08-02 NOTE — Progress Notes (Signed)
  Echocardiogram 2D Echocardiogram has been performed.  Lauren Waller 08/02/2019, 2:11 PM

## 2019-08-02 NOTE — ED Notes (Signed)
Patient transported to MRI 

## 2019-08-02 NOTE — ED Notes (Signed)
Patient transported to X-ray 

## 2019-08-02 NOTE — Plan of Care (Signed)
Note reviewed. Pt just saw by Dr. Cheral Marker this am and stroke work up underway. Currently MRI and MRA , carotid doppler, 2D echo, lipid panel and A1C pending. Of note, her glucose is very high and primary team is working on that as well as AKI, leukocytosis, dehydration and hypertensive urgency. Put her on ASA PR given NPO status for stroke treatment. Stroke team will follow up in am.   Rosalin Hawking, MD PhD Stroke Neurology 08/02/2019 9:25 AM

## 2019-08-02 NOTE — ED Notes (Signed)
Lunch tray ordered 

## 2019-08-02 NOTE — ED Triage Notes (Addendum)
Patient arrived with EMS from home , found on the floor beside her bed , patient stated she lost her balance and fell this morning , denies pain , CBG=515 by EMS , history of DM and hypertension .

## 2019-08-03 ENCOUNTER — Inpatient Hospital Stay (HOSPITAL_COMMUNITY): Payer: Medicare Other

## 2019-08-03 DIAGNOSIS — I639 Cerebral infarction, unspecified: Secondary | ICD-10-CM

## 2019-08-03 DIAGNOSIS — Z8673 Personal history of transient ischemic attack (TIA), and cerebral infarction without residual deficits: Secondary | ICD-10-CM

## 2019-08-03 LAB — GLUCOSE, CAPILLARY
Glucose-Capillary: 148 mg/dL — ABNORMAL HIGH (ref 70–99)
Glucose-Capillary: 228 mg/dL — ABNORMAL HIGH (ref 70–99)
Glucose-Capillary: 329 mg/dL — ABNORMAL HIGH (ref 70–99)
Glucose-Capillary: 285 mg/dL — ABNORMAL HIGH (ref 70–99)

## 2019-08-03 LAB — LIPID PANEL
Cholesterol: 306 mg/dL — ABNORMAL HIGH (ref 0–200)
HDL: 55 mg/dL (ref >40)
LDL Cholesterol: 223 mg/dL — ABNORMAL HIGH (ref 0–99)
Total CHOL/HDL Ratio: 5.6 RATIO
Triglycerides: 140 mg/dL (ref <150)
VLDL: 28 mg/dL (ref 0–40)

## 2019-08-03 LAB — HEMOGLOBIN A1C
Hgb A1c MFr Bld: 14.2 % — ABNORMAL HIGH (ref 4.8–5.6)
Mean Plasma Glucose: 360.84 mg/dL

## 2019-08-03 MED ORDER — HYDRALAZINE HCL 20 MG/ML IJ SOLN
10.0000 mg | Freq: Four times a day (QID) | INTRAMUSCULAR | Status: DC | PRN
  Administered 2019-08-03: 10 mg via INTRAVENOUS
  Filled 2019-08-03: qty 1

## 2019-08-03 MED ORDER — AMLODIPINE BESYLATE 10 MG PO TABS
10.0000 mg | ORAL_TABLET | Freq: Every day | ORAL | Status: DC
  Administered 2019-08-03 – 2019-08-06 (×4): 10 mg via ORAL
  Filled 2019-08-03 (×4): qty 1

## 2019-08-03 MED ORDER — ATENOLOL 100 MG PO TABS
100.0000 mg | ORAL_TABLET | Freq: Every day | ORAL | Status: DC
  Administered 2019-08-03 – 2019-08-10 (×8): 100 mg via ORAL
  Filled 2019-08-03 (×3): qty 1
  Filled 2019-08-03: qty 2
  Filled 2019-08-03 (×3): qty 1
  Filled 2019-08-03: qty 2
  Filled 2019-08-03: qty 1

## 2019-08-03 MED ORDER — CLOPIDOGREL BISULFATE 75 MG PO TABS
75.0000 mg | ORAL_TABLET | Freq: Every day | ORAL | Status: DC
  Administered 2019-08-03 – 2019-08-10 (×8): 75 mg via ORAL
  Filled 2019-08-03 (×9): qty 1

## 2019-08-03 MED ORDER — HYDRALAZINE HCL 50 MG PO TABS
100.0000 mg | ORAL_TABLET | Freq: Two times a day (BID) | ORAL | Status: DC
  Administered 2019-08-03 – 2019-08-07 (×9): 100 mg via ORAL
  Filled 2019-08-03 (×8): qty 2

## 2019-08-03 MED ORDER — ASPIRIN EC 81 MG PO TBEC
81.0000 mg | DELAYED_RELEASE_TABLET | Freq: Every day | ORAL | Status: DC
  Administered 2019-08-04 – 2019-08-10 (×7): 81 mg via ORAL
  Filled 2019-08-03 (×8): qty 1

## 2019-08-03 NOTE — Evaluation (Signed)
Occupational Therapy Evaluation Patient Details Name: Lauren Waller MRN: [id number] DOB: 09-17-1956 Today's Date: 08/03/2019    History of Present Illness This 63 y.o. female admitted after beinf found on the floor by her bed.  CT of head showed LT superior cerebellar artery infarct.  MRI showed moderately large acute Lt SCA terrirtory infarct, chronic small vessel ischemic disease with chronic lacunar infarcts, and advanced intracranial atherosclerosis with severe anterior and posterior circulation stenosis.  PMH Includes: DM, HTN, depression, chronic LBP   Clinical Impression   Pt admitted with above. She demonstrates the below listed deficits and will benefit from continued OT to maximize safety and independence with BADLs.  Pt presents to OT with dysmetria bil. UEs, nausea and vomiting with activity/movement, LE ataxia, lethargy, impaired cognition including deficits with sequencing, problem solving, following commands, and safety awareness.  She currently requires min - max A for ADLs and mod A +2 for functional mobility.  PTA, she lived alone and was fully independent with ADLs, and ambulated with SPC.  She does not drive and relies on public transportation.  Recommend CIR to allow her to maximize safety and independence with ADLs.       Follow Up Recommendations  CIR;Supervision/Assistance - 24 hour    Equipment Recommendations  None recommended by OT    Recommendations for Other Services Rehab consult     Precautions / Restrictions Precautions Precautions: Fall Precaution Comments: pt ataxic       Mobility Bed Mobility               General bed mobility comments: Pt sitting EOB with PT   Transfers Overall transfer level: Needs assistance Equipment used: Rolling walker (2 wheeled) Transfers: Sit to/from [employer] Sit to Stand: Mod assist;+2 safety/equipment Stand pivot transfers: Mod assist;+2 physical assistance;+2 safety/equipment             Balance Overall balance assessment: Needs assistance Sitting-balance support: Feet supported Sitting balance-Leahy Scale: Poor Sitting balance - Comments: requires min guard to min A    Standing balance support: Bilateral upper extremity supported;During functional activity Standing balance-Leahy Scale: Poor Standing balance comment: requires min - mod A to maintain standing balance                            ADL either performed or assessed with clinical judgement   ADL Overall ADL's : Needs assistance/impaired Eating/Feeding: Minimal assistance;Sitting   Grooming: Wash/dry hands;Wash/dry face;Oral care;Brushing hair;Minimal assistance;Standing Grooming Details (indicate cue type and reason): assist for balance.  She requires min cues to sequence brushing teeth as she failed to apply toothpaste to the toothbrush  Upper Body Bathing: Moderate assistance;Sitting   Lower Body Bathing: Maximal assistance;Sit to/from stand   Upper Body Dressing : Moderate assistance;Sitting   Lower Body Dressing: Maximal assistance;Sit to/from stand   Toilet Transfer: Moderate assistance;+2 for safety/equipment;Ambulation;Regular Toilet;BSC;RW   Toileting- Clothing Manipulation and Hygiene: Maximal assistance;Sit to/from stand       Functional mobility during ADLs: Moderate assistance;+2 for safety/equipment;+2 for physical assistance;Rolling walker General ADL Comments: Pt impulsive, ataxic and with difficulty following commands      Vision   Additional Comments: Pt with eyes closed frequently, and had difficulty engaging in visual assessment due to nausea/vomiting and poor attention      Perception     Praxis Praxis Praxis tested?: Deficits Deficits: Organization    Pertinent Vitals/Pain Pain Assessment: No/denies pain     Hand Dominance Right  Extremity/Trunk Assessment Upper Extremity Assessment Upper Extremity Assessment: RUE deficits/detail;LUE  deficits/detail RUE Deficits / Details: Pt with dysmetria  RUE Coordination: decreased fine motor;decreased gross motor LUE Deficits / Details: dysmetria noted  LUE Coordination: decreased fine motor;decreased gross motor   Lower Extremity Assessment Lower Extremity Assessment: Defer to PT evaluation       Communication Communication Communication: Expressive difficulties(slurred speech )   Cognition Arousal/Alertness: Lethargic Behavior During Therapy: Flat affect;Impulsive Overall Cognitive Status: Impaired/Different from baseline Area of Impairment: Attention;Following commands;Safety/judgement;Awareness;Problem solving                   Current Attention Level: Sustained(with up to mod verbal cues)   Following Commands: Follows one step commands inconsistently;Follows one step commands with increased time Safety/Judgement: Decreased awareness of safety;Decreased awareness of deficits   Problem Solving: Slow processing;Difficulty sequencing;Requires verbal cues;Requires tactile cues General Comments: Pt is very slow to respond initially, follows simple commands inconsistently.  she is very impulsive with poor safety awareness    General Comments  pt with c/o nausea during session with onset of vomiting at end of session.  BP 196/88. RN notified and present at end of session     Exercises     Shoulder Instructions      Home Living Family/patient expects to be discharged to:: Private residence Living Arrangements: Alone                               Additional Comments: Pt's brother is currently visiting from [facility], but will return home on 10/12       Prior Functioning/Environment Level of Independence: Independent with assistive device(s)        Comments: Pt ambulates with SPC.  Pt does not drive and relies on public transportation         OT Problem List: Decreased activity tolerance;Impaired balance (sitting and/or standing);Impaired  vision/perception;Decreased coordination;Decreased cognition;Decreased safety awareness;Decreased knowledge of use of DME or AE;Impaired UE functional use      OT Treatment/Interventions: Self-care/ADL training;Neuromuscular education;DME and/or AE instruction;Therapeutic activities;Cognitive remediation/compensation;Visual/perceptual remediation/compensation;Patient/family education;Balance training    OT Goals(Current goals can be found in the care plan section) Acute Rehab OT Goals Patient Stated Goal: pt did not state  OT Goal Formulation: With patient/family Time For Goal Achievement: 08/17/19 Potential to Achieve Goals: Good ADL Goals Pt Will Perform Grooming: with min guard assist;standing Pt Will Perform Upper Body Bathing: with set-up;with supervision;sitting Pt Will Perform Lower Body Bathing: with min assist;sit to/from stand Pt Will Perform Upper Body Dressing: with set-up;with supervision;sitting Pt Will Perform Lower Body Dressing: sit to/from stand;with min assist Pt Will Transfer to Toilet: with min assist;ambulating;regular height toilet;bedside commode;grab bars Pt Will Perform Toileting - Clothing Manipulation and hygiene: with min assist;sit to/from stand Additional ADL Goal #1: Pt will be selectively attend to familiar ADL task with no more than 1 cue Additional ADL Goal #2: pt will demonstrate emergent awareness of deficits  OT Frequency: Min 2X/week   Barriers to D/C: Decreased caregiver support          Co-evaluation PT/OT/SLP Co-Evaluation/Treatment: Yes Reason for Co-Treatment: For patient/therapist safety;Necessary to address cognition/behavior during functional activity PT goals addressed during session: Mobility/safety with mobility;Balance;Proper use of DME OT goals addressed during session: ADL's and self-care      AM-PAC OT "6 Clicks" Daily Activity     Outcome Measure Help from another person eating meals?: A Little Help from another person taking  care of personal grooming?: A Little Help from another person toileting, which includes using toliet, bedpan, or urinal?: A Lot Help from another person bathing (including washing, rinsing, drying)?: A Lot Help from another person to put on and taking off regular upper body clothing?: A Lot Help from another person to put on and taking off regular lower body clothing?: A Lot 6 Click Score: 14   End of Session Equipment Utilized During Treatment: Rolling walker;Gait belt Nurse Communication: Mobility status  Activity Tolerance: Patient tolerated treatment well;Patient limited by fatigue Patient left: in chair;with call bell/phone within reach;with chair alarm set;with nursing/sitter in room;with family/visitor present  OT Visit Diagnosis: Unsteadiness on feet (R26.81);History of falling (Z91.81);Ataxia, unspecified (R27.0);Cognitive communication deficit (R41.841) Symptoms and signs involving cognitive functions: Cerebral infarction                Time: [id number] OT Time Calculation (min): 29 min Charges:  OT General Charges $OT Visit: 1 Visit OT Evaluation $OT Eval Moderate Complexity: 1 Mod  Jeani HawkingWendi Aristotle Lieb, OTR/L Acute Rehabilitation Services Pager [phone] Office [phone]   Jeani HawkingConarpe, Josejulian Tarango M 08/03/2019, 5:06 PM

## 2019-08-03 NOTE — Progress Notes (Signed)
PROGRESS NOTE  Lauren Waller  DOB: 1956/10/04  PCP: Benito Mccreedy, MD LZJ:[id number]  DOA: 08/02/2019  LOS: 1 day   Brief narrative: Lauren Waller is a 63 y.o. female with PMH of HTN, DM2, HLD, low back pain.  Depression who lives alone at home. Patient presented to the ED on 08/02/2019 after having been found on the floor by her bed.   Apparently, for last several days, patient was having nausea, vomiting and persistent dizziness with recurrent falls.  She had a fall and could not get up by herself.  EMS was called.  Blood sugar was noted to be elevated to 515 and blood pressure was more than 024 systolic.  She is brought to the ED.   In the ED, patient was afebrile.  Blood pressure was elevated at 233/128. Work-up showed sodium 141, potassium 3.6, glucose 570, BUN/creatinine 29/1.28, WBC count 15.6, hemoglobin 14.9, platelet 408. Urinalysis showed hazy straw-colored urine protein, rare bacteria CT scan of head showed recent appearing left superior cerebellar artery infarct.  Patient was admitted under hospitalist service for acute cerebellar stroke. Neurology consultation was called. MRI of the brain, MRA head and neck showed: 1. Moderately large acute left SCA territory infarct. 2. Chronic small vessel ischemic disease with chronic lacunar infarcts. 3. Advanced intracranial atherosclerosis with severe anterior and posterior circulation stenoses. Echocardiogram showed EF 60 to 65% LVH and impaired relaxation. Carotid duplex did not show any significant stenosis.  Subjective: Patient was seen and examined this African-American female.  Looks older for age.  Propped up in bed.  Taking his lunch.  Not in distress.  Patient states she lives alone.  Assessment/Plan: Acute left superior cerebellar artery infarct Advanced intracranial atherosclerosis with severe anterior and posterior circulation stenosis -Presented with acute persistent dizziness, nausea, vomiting and  recurrent falls for 2 days -CT scan of head, MRI brain, MRA head and neck and carotid duplex findings as above. -No cardiac source of embolism found on echocardiogram -Neurology consultation obtained.  Started on aspirin 81 mg daily, Plavix 75 mg daily.  Continue aspirin and Plavix for 3 weeks and then aspirin alone.  Lipitor dose increased to 80 mg daily. -TEE and loop next week inpatient versus outpatient depending on disposition.  Hypertensive urgency -Blood pressure on arrival elevated to 233/128. -Chronically uncontrolled hypertension, likely cause of lacunar infarcts and microvascular changes in the brain. -Home meds include amlodipine 10 mg daily, atenolol 100 mg daily and hydralazine twice daily. -Permissive hypertension was allowed for first 24 hours.  Resume all of blood pressure medicines.  Also ordered for hydralazine IV PRN.  Acute kidney injury -Creatinine normal at baseline.  Presented with creatinine elevated to 1.28.  -Repeat creatinine tomorrow.  Leukocytosis -WBC elevated to 15.6 on admission, no evidence of infection. -Repeat CBC tomorrow.  Diabetes mellitus type 2 - A1c 14.2 -I do not see any insulin or other diabetes medications on her home list. -Currently patient is on Lantus 15 units daily and sliding scale insulin with Accu-Cheks. -May also add oral agents at discharge.  Hyperlipidemia -LDL 223, goal less than 70 -Home meds include Lipitor 20 mg daily.  Dose increased to 80 mg daily.  Body mass index is 31.09 kg/m. Mobility: Encourage ambulation.  PT eval ordered DVT prophylaxis:  Lovenox subcu Code Status:   Code Status: Full Code  Family Communication:  Expected Discharge:  Pending PT eval  Consultants:  Neurology  Procedures:    Antimicrobials: Anti-infectives (From admission, onward)   None  Diet Order            Diet heart healthy/carb modified Fluid consistency: Thin  Diet effective ____              Infusions:     Scheduled Meds:  aspirin EC  81 mg Oral Daily   atorvastatin  40 mg Oral Daily   clopidogrel  75 mg Oral Daily   enoxaparin (LOVENOX) injection  40 mg Subcutaneous Q24H   insulin aspart  0-15 Units Subcutaneous TID WC   insulin aspart  0-5 Units Subcutaneous QHS   insulin glargine  15 Units Subcutaneous Daily    PRN meds: acetaminophen **OR** acetaminophen (TYLENOL) oral liquid 160 mg/5 mL **OR** acetaminophen, hydrALAZINE, senna-docusate   Objective: Vitals:   08/03/19 1132 08/03/19 1233  BP: (!) 214/91 (!) (P) 152/67  Pulse: 66 (P) 72  Resp: 18   Temp: 98.4 F (36.9 C)   SpO2: 97%     Intake/Output Summary (Last 24 hours) at 08/03/2019 1433 Last data filed at 08/03/2019 1133 Gross per 24 hour  Intake 2994.95 ml  Output 1000 ml  Net 1994.95 ml   Filed Weights   08/02/19 1606  Weight: 77.1 kg   Weight change:  Body mass index is 31.09 kg/m.   Physical Exam: General exam: Appears calm and comfortable.  Skin: No rashes, lesions or ulcers. HEENT: Atraumatic, normocephalic, supple neck, no obvious bleeding Lungs: Clear to auscultation bilaterally CVS: Regular rate and rhythm, no murmur GI/Abd soft, nontender, nondistended, bowel sound present CNS: Alert, awake, oriented to place and person, slow to respond Psychiatry: Mood appropriate Extremities: No pedal edema, no calf tenderness  Data Review: I have personally reviewed the laboratory data and studies available.  Recent Labs  Lab 08/02/19 0240 08/02/19 0558  WBC 15.6*  --   NEUTROABS 13.4*  --   HGB 14.9 14.6  HCT 47.8* 43.0  MCV 88.7  --   PLT 408*  --    Recent Labs  Lab 08/02/19 0240 08/02/19 0558  NA 141 144  K 3.6 3.5  CL 96*  --   CO2 24  --   GLUCOSE 570*  --   BUN 29*  --   CREATININE 1.28*  --   CALCIUM 10.0  --     Lorin Glass, MD  [facility] 08/03/2019

## 2019-08-03 NOTE — Evaluation (Signed)
Physical Therapy Evaluation Patient Details Name: Lauren Waller MRN: [id number] DOB: 12/30/1955 Today's Date: 08/03/2019   History of Present Illness  This 63 y.o. female admitted after beinf found on the floor by her bed.  CT of head showed LT superior cerebellar artery infarct.  MRI showed moderately large acute Lt SCA terrirtory infarct, chronic small vessel ischemic disease with chronic lacunar infarcts, and advanced intracranial atherosclerosis with severe anterior and posterior circulation stenosis.  PMH Includes: DM, HTN, depression, chronic LBP    Clinical Impression  Pt was living alone - she wasn't checking her blood sugars and was drinking lots of sugary drinks (she was afraid her blood sugar was getting too low).  Pt now with lethargy, nausea with activity, and ataxic gait.  Pt needs +2 for safety with hospital room mobilty with increased step by step cues for technque.  Pt would benefit from increased therapy to help her decrease fall risk. Pts brother is going to check with family to see how much care available to help pt once ready for DC home.  PT will continue to work with pt on deficits described below.    Follow Up Recommendations CIR;Supervision/Assistance - 24 hour    Equipment Recommendations  None recommended by PT    Recommendations for Other Services Rehab consult     Precautions / Restrictions Precautions Precautions: Fall Precaution Comments: pt ataxic - pt reports frequent recent falls Restrictions Weight Bearing Restrictions: No      Mobility  Bed Mobility Overal bed mobility: Needs Assistance Bed Mobility: Supine to Sit     Supine to sit: Mod assist     General bed mobility comments: pt needed step by step cues on technque for sitting EOB.  pt didnt do spontaneous adjustments etc - needed cues to scoot forward and get balanced  Transfers Overall transfer level: Needs assistance Equipment used: Rolling walker (2 wheeled) Transfers: Sit  to/from [employer] Sit to Stand: Mod assist;+2 safety/equipment Stand pivot transfers: Mod assist;+2 physical assistance;+2 safety/equipment       General transfer comment: pt lethargic today - h ad +2 for all OOB.  pt needed step by step cues for hand placement and techque to get up.  pt needed assist with initial standing balance and cues to stand more erect  Ambulation/Gait Ambulation/Gait assistance: Mod assist;+2 physical assistance;+2 safety/equipment Gait Distance (Feet): 20 Feet Assistive device: Rolling walker (2 wheeled) Gait Pattern/deviations: Step-to pattern;Decreased step length - left;Decreased stance time - left;Shuffle;Trunk flexed;Ataxic     General Gait Details: pt ataxic gait - left leg not stepping through - pt had to be reminded to take longer stride wtih left.  pt with short shuffling steps.  not safe with turns or backing up  Stairs            Wheelchair Mobility    Modified Rankin (Stroke Patients Only)       Balance Overall balance assessment: Needs assistance Sitting-balance support: Feet supported Sitting balance-Leahy Scale: Poor Sitting balance - Comments: pt with flexed trunk - she said she was tired - leaned back in bed once like she just fell alseep in sitting - needed asssit to return to sitting   Standing balance support: Bilateral upper extremity supported;During functional activity Standing balance-Leahy Scale: Poor Standing balance comment: requires min - mod A to maintain standing balance  Pertinent Vitals/Pain Pain Assessment: No/denies pain    Home Living Family/patient expects to be discharged to:: Private residence Living Arrangements: Alone Available Help at Discharge: Friend(s);Home health           Home Equipment: Dan Humphreys - 2 wheels;Cane - single point;Bedside commode Additional Comments: Pt's brother is currently visiting from [facility], but will return home on 10/12      Prior Function Level of Independence: Independent with assistive device(s)         Comments: Pt ambulates with SPC.  Pt does not drive and relies on public transportation.  she has a nurse that comes in 3x a week     Hand Dominance   Dominant Hand: Right    Extremity/Trunk Assessment   Upper Extremity Assessment Upper Extremity Assessment: Defer to OT evaluation RUE Deficits / Details: Pt with dysmetria  RUE Coordination: decreased fine motor;decreased gross motor LUE Deficits / Details: dysmetria noted  LUE Coordination: decreased fine motor;decreased gross motor    Lower Extremity Assessment Lower Extremity Assessment: Generalized weakness(pt slow to respond with MMT - left ankle less coordinated than right.  functional strength)    Cervical / Trunk Assessment Cervical / Trunk Assessment: Normal  Communication   Communication: Expressive difficulties  Cognition Arousal/Alertness: Lethargic Behavior During Therapy: Flat affect;Impulsive Overall Cognitive Status: Impaired/Different from baseline Area of Impairment: Attention;Following commands;Safety/judgement;Awareness;Problem solving                   Current Attention Level: Sustained(with up to mod verbal cues)   Following Commands: Follows one step commands inconsistently;Follows one step commands with increased time Safety/Judgement: Decreased awareness of safety;Decreased awareness of deficits   Problem Solving: Slow processing;Difficulty sequencing;Requires verbal cues;Requires tactile cues General Comments: Pt is very slow to respond initially, follows simple commands inconsistently.  she is very impulsive with poor safety awareness.  pt sleepy - lethargic.  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  BP before session 193/78 and after session 196/88.  nursing aware  and giving meds.  pts blood sugar 330 - she said this is good for her    Exercises     Assessment/Plan    PT Assessment Patient needs continued PT services  PT Problem List Decreased strength;Decreased mobility;Decreased safety awareness;Decreased knowledge of precautions;Decreased activity tolerance;Decreased cognition;Cardiopulmonary status limiting activity;Decreased balance;Decreased knowledge of use of DME       PT Treatment Interventions DME instruction;Therapeutic activities;Gait training;Therapeutic exercise;Patient/family education;Balance training;Functional mobility training;Neuromuscular re-education    PT Goals (Current goals can be found in the Care Plan section)  Acute Rehab PT Goals Patient Stated Goal: pt did not state  PT Goal Formulation: With patient/family Time For Goal Achievement: 08/17/19 Potential to Achieve Goals: Fair    Frequency Min 3X/week   Barriers to discharge Decreased caregiver support brother to talk to family - see how much assist can be given    Co-evaluation PT/OT/SLP Co-Evaluation/Treatment: Yes Reason for Co-Treatment: For patient/therapist safety;Necessary to address cognition/behavior during functional activity PT goals addressed during session: Mobility/safety with mobility;Balance;Proper use of DME OT goals addressed during session: ADL's and self-care       AM-PAC PT "6 Clicks" Mobility  Outcome Measure Help needed turning from your back to your side while in a flat bed without using bedrails?: A Lot Help needed moving from lying on your back to sitting on  the side of a flat bed without using bedrails?: A Lot Help needed moving to and from a bed to a chair (including a wheelchair)?: A Lot Help needed standing up from a chair using your arms (e.g., wheelchair or bedside chair)?: A Lot Help needed to walk in hospital room?: A Lot Help needed climbing 3-5 steps with a railing? : Total 6 Click Score: 11    End of Session  Equipment Utilized During Treatment: Gait belt Activity Tolerance: Patient limited by lethargy Patient left: in chair;with chair alarm set;with call bell/phone within reach;with family/visitor present Nurse Communication: Mobility status;Precautions PT Visit Diagnosis: Unsteadiness on feet (R26.81);Other abnormalities of gait and mobility (R26.89);Repeated falls (R29.6);Muscle weakness (generalized) (M62.81);Ataxic gait (R26.0);Other symptoms and signs involving the nervous system (R29.898);Difficulty in walking, not elsewhere classified (R26.2)    Time: 1520-1600 PT Time Calculation (min) (ACUTE ONLY): 40 min   Charges:   PT Evaluation $PT Eval Low Complexity: 1 Low PT Treatments $Gait Training: 8-22 mins        08/03/2019   Ranae PalmsElizabeth Ansley Mangiapane, PT   Judson RochHildreth, Barrie Wale Gardner 08/03/2019, 6:05 PM

## 2019-08-03 NOTE — Progress Notes (Signed)
   08/03/19 1132  Vitals  Temp 98.4 F (36.9 C)  Temp Source Oral  BP (!) 214/91  MAP (mmHg) 130  BP Location Left Arm  BP Method Automatic  Patient Position (if appropriate) Lying  Pulse Rate 66  Pulse Rate Source Dinamap  Resp 18  Oxygen Therapy  SpO2 97 %  O2 Device Room Air   MD notify of B/P. Orders received.

## 2019-08-03 NOTE — Progress Notes (Signed)
SLP Cancellation Note  Patient Details Name: Lauren Waller MRN: [id number] DOB: Oct 24, 1956   Cancelled treatment:       Reason Eval/Treat Not Completed: Patient politely declined speech/language/cognitive evaluation on this date stating that she would prefer to rest today.  ST will continue to follow up as appropriate.    [name], M.S., [place] Acute Rehabilitation Services Office: [phone]  [place] 08/03/2019, 1:08 PM

## 2019-08-03 NOTE — Progress Notes (Signed)
STROKE TEAM PROGRESS NOTE   HISTORY OF PRESENT ILLNESS (per record) Lauren Waller is an 63 y.o. female who presented to the ED early this AM after having been found on the floor beside her bed. She had lost her balance, which had resulted in the fall. EMS was called to her home and on arrival noted CBG to be 515. On arrival to the ED she was markedly hypertensive.  She had been vomiting, with nausea for several days. Due to this, she had been unable to take any of her medications for her hypertension or diabetes. She was complaining of acute persistent dizziness with recurrent falls for about 2 days. She reported room spinning dizziness when standing up.   EKG:  Sinus tachycardia Prominent P waves, nondiagnostic Abnormal R-wave progression, late transition Left ventricular hypertrophy Nonspecific T abnormalities, lateral leads When compared with ECG of 04/17/2018, No significant change was found. LSN: > 24 hours in the context of vomiting for several days, likely secondary to the cerebellar infarction seen on CT tPA Given: No: Out of time window   INTERVAL HISTORY I have personally reviewed history of present illness with patient in details, electronic medical records and imaging films in PACS.She is doing better and less dizzy and not falling back as much now when she gets up.MRI brain shows left superior cerebellar arty infarct.    OBJECTIVE Vitals:   08/02/19 1950 08/03/19 0024 08/03/19 0454 08/03/19 0835  BP: (!) 171/80 (!) 165/90 (!) 170/95 (!) 190/80  Pulse: 72 64 (!) 58 62  Resp: 18 16 16 18   Temp: 98 F (36.7 C) 97.9 F (36.6 C) 97.9 F (36.6 C) 98.4 F (36.9 C)  TempSrc: Oral Oral Oral Oral  SpO2: 98% 100% 99% 97%  Weight:      Height:        CBC:  Recent Labs  Lab 08/02/19 0240 08/02/19 0558  WBC 15.6*  --   NEUTROABS 13.4*  --   HGB 14.9 14.6  HCT 47.8* 43.0  MCV 88.7  --   PLT 408*  --     Basic Metabolic Panel:  Recent Labs  Lab 08/02/19 0240  08/02/19 0558  NA 141 144  K 3.6 3.5  CL 96*  --   CO2 24  --   GLUCOSE 570*  --   BUN 29*  --   CREATININE 1.28*  --   CALCIUM 10.0  --     Lipid Panel:     Component Value Date/Time   CHOL 306 (H) 08/03/2019 0533   TRIG 140 08/03/2019 0533   HDL 55 08/03/2019 0533   CHOLHDL 5.6 08/03/2019 0533   VLDL 28 08/03/2019 0533   LDLCALC 223 (H) 08/03/2019 0533   HgbA1c:  Lab Results  Component Value Date   HGBA1C 14.2 (H) 08/03/2019   Urine Drug Screen:     Component Value Date/Time   LABOPIA NONE DETECTED 08/02/2019 0529   COCAINSCRNUR NONE DETECTED 08/02/2019 0529   LABBENZ NONE DETECTED 08/02/2019 0529   AMPHETMU NONE DETECTED 08/02/2019 0529   THCU NONE DETECTED 08/02/2019 0529   LABBARB NONE DETECTED 08/02/2019 0529    Alcohol Level     Component Value Date/Time   ETH <10 08/02/2019 0255    IMAGING  Dg Chest 1 View 08/02/2019 IMPRESSION:  No acute cardiopulmonary process.   Dg Lumbar Spine Complete 08/02/2019 IMPRESSION:  No acute fracture or malalignment.   Ct Head Wo Contrast 08/02/2019 IMPRESSION:  1. Recent appearing left superior cerebellar artery  infarct.  2. No intracranial hemorrhage or cervical spine fracture.   Ct Cervical Spine Wo Contrast 08/02/2019 IMPRESSION:  1. Recent appearing left superior cerebellar artery infarct.  2. No intracranial hemorrhage or cervical spine fracture.    Mr Brain 49 Contrast Mr Angio Head Wo Contrast 08/02/2019 IMPRESSION:  1. Moderately large acute left SCA territory infarct.  2. Chronic small vessel ischemic disease with chronic lacunar infarcts as above.  3. Advanced intracranial atherosclerosis with severe anterior and posterior circulation stenoses as above.   Vas US Carotid 08/03/2019 Summary:  Right Carotid: The extracranial vessels were near-normal with only minimal wall thickening or plaque.  Left Carotid: The extracranial vessels were near-normal with only minimal wall thickening or plaque.   Vertebrals:  Bilateral vertebral arteries demonstrate antegrade flow.  Subclavians: Normal flow hemodynamics were seen in bilateral subclavian arteries.  Final    Transthoracic Echocardiogram  08/02/19 IMPRESSIONS  1. Left ventricular ejection fraction, by visual estimation, is 60 to 65%. The left ventricle has normal function. Normal left ventricular size. There is moderately increased left ventricular hypertrophy.  2. Elevated mean left atrial pressure.  3. Left ventricular diastolic Doppler parameters are consistent with impaired relaxation pattern of LV diastolic filling.  4. Global right ventricle has normal systolic function.The right ventricular size is normal. No increase in right ventricular wall thickness.  5. Left atrial size was normal.  6. Right atrial size was normal.  7. The mitral valve is normal in structure. No evidence of mitral valve regurgitation. No evidence of mitral stenosis.  8. The tricuspid valve is normal in structure. Tricuspid valve regurgitation was not visualized by color flow Doppler.  9. The aortic valve is normal in structure. Aortic valve regurgitation was not visualized by color flow Doppler. Structurally normal aortic valve, with no evidence of sclerosis or stenosis. 10. The pulmonic valve was normal in structure. Pulmonic valve regurgitation is not visualized by color flow Doppler. 11. The inferior vena cava is normal in size with greater than 50% respiratory variability, suggesting right atrial pressure of 3 mmHg. 12. No embolic source identified.    ECG - ST rate 100 BPM. (See cardiology reading for complete details)    PHYSICAL EXAM Blood pressure (!) 190/80, pulse 62, temperature 98.4 F (36.9 C), temperature source Oral, resp. rate 18, height 5\' 2"  (1.575 m), weight 77.1 kg, SpO2 97 %. Obese middle aged african american lady not in distress. . Afebrile. Head is nontraumatic. Neck is supple without bruit.    Cardiac exam no murmur or gallop.  Lungs are clear to auscultation. Distal pulses are well felt.  Neurological Exam ;  Awake  Alert oriented x 3. Normal speech  Except mild dysathria and language.eye movements full without nystagmus.fundi were not visualized. Vision acuity and fields appear normal. Hearing is normal. Palatal movements are normal. Face symmetric. Tongue midline. Normal strength, tone, reflexes and coordination. Normal sensation. Gait deferred.     ASSESSMENT/PLAN [name] is a 63 y.o. female with history of HLD, HTN, DM, depression and chronic low back pain presenting with acute persistent dizziness with recurrent falls for about 2 days associated with N&V resulting in inability to take her medications with subsequent hyperglycemia (515) and significantly elevated BP. 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  MRI head - Moderately large acute left SCA territory infarct. Chronic small vessel ischemic disease with chronic lacunar infarcts  MRA head - Advanced intracranial atherosclerosis with severe anterior and posterior circulation stenoses  CTA H&N - not ordered  CT Perfusion - not ordered  Carotid Doppler - unremarkable  2D Echo - EF 60 - 65%. No cardiac source of emboli identified.   Sars Corona Virus 2 - negative  LDL - 223    Component Value Date/Time   LDLCALC 223 (H) 08/03/2019 0533    HgbA1c - 14.2  UDS - negative  VTE prophylaxis - Lovenox  Diet - Heart healthy / carb modified with thin liquids.  No antithrombotic prior to admission, now on aspirin 325 mg daily  Patient counseled to be compliant with her antithrombotic medications  Ongoing aggressive stroke risk factor management  Therapy recommendations:   pending  Disposition:  Pending  Hypertension  Home BP meds: Amlodipine ; Tenormin ; Hydralazine  Current BP meds: none  Blood pressure somewhat high at times but within post stroke/TIA parameters . Permissive hypertension (OK if < 220/120) but gradually normalize in 5-7 days . Long-term BP goal normotensive  Hyperlipidemia  Home Lipid lowering medication:  Liptor 20 mg daily  LDL 223, goal < 70  Current lipid lowering medication: consider Lipitor 80 mg daily   Continue statin at discharge  Diabetes  Home diabetic meds:  Current diabetic meds: insulin  HgbA1c 14.2, goal < 7.0 Recent Labs    08/02/19 1721 08/02/19 2103 08/03/19 0626  GLUCAP 363* 257* 148*    Other Stroke Risk Factors  Advanced age  Previous ETOH use  Obesity, Body mass index is 31.09 kg/m., recommend weight loss, diet and exercise as appropriate   Hx stroke/TIA by imaging  Other Active Problems  Recent N&V  AKI - creatinine - 1.28  Leukocytosis - 15.6 (afebrile)   Hospital day # 1  I have personally obtained history,examined this patient, reviewed notes, independently viewed imaging studies, participated in medical decision making and plan of care.ROS completed by me personally and pertinent positives fully documented  I have made any additions or clarifications directly to the above note. Continue aspirin and plavix x 3 weeks and then aspirin alone. Telemetry monitoring. TEE and loop next week.  Greater than 50% time during this 35-minute visit was spent on counseling and coordination of care about her embolic stroke and discussion about evaluation, prevention and treatment and answering questions.D/W Dr Pietro Cassis Antony Contras, MD Medical Director [place] Pager: [phone] 08/03/2019 1:47 PM   To contact Stroke Continuity provider, please refer to [email or web address] After hours, contact General Neurology

## 2019-08-03 NOTE — Progress Notes (Signed)
VASCULAR LAB PRELIMINARY  PRELIMINARY  PRELIMINARY  PRELIMINARY  Carotid duplex completed.    Preliminary report:  See CV proc for preliminary results.    Atif Chapple, RVT 08/03/2019, 10:02 AM

## 2019-08-04 LAB — GLUCOSE, CAPILLARY
Glucose-Capillary: 235 mg/dL — ABNORMAL HIGH (ref 70–99)
Glucose-Capillary: 179 mg/dL — ABNORMAL HIGH (ref 70–99)
Glucose-Capillary: 293 mg/dL — ABNORMAL HIGH (ref 70–99)
Glucose-Capillary: 322 mg/dL — ABNORMAL HIGH (ref 70–99)

## 2019-08-04 NOTE — Progress Notes (Signed)
STROKE TEAM PROGRESS NOTE      INTERVAL HISTORY Her daughter is at the bedside.  I reviewed with her patient's neurological presentation, findings from stroke evaluation and discuss plan of care and answered questions.  Patient still has significant truncal ataxia.    OBJECTIVE Vitals:   08/03/19 1700 08/03/19 2048 08/04/19 0020 08/04/19 0420  BP: 126/66 113/71 (!) 168/78 130/66  Pulse:  62 (!) 58 (!) 57  Resp:  17 18 17   Temp:  98.3 F (36.8 C) 98.2 F (36.8 C) 99.1 F (37.3 C)  TempSrc:  Oral Oral Oral  SpO2:  98% 97% 94%  Weight:      Height:        CBC:  Recent Labs  Lab 08/02/19 0240 08/02/19 0558  WBC 15.6*  --   NEUTROABS 13.4*  --   HGB 14.9 14.6  HCT 47.8* 43.0  MCV 88.7  --   PLT 408*  --     Basic Metabolic Panel:  Recent Labs  Lab 08/02/19 0240 08/02/19 0558  NA 141 144  K 3.6 3.5  CL 96*  --   CO2 24  --   GLUCOSE 570*  --   BUN 29*  --   CREATININE 1.28*  --   CALCIUM 10.0  --     Lipid Panel:     Component Value Date/Time   CHOL 306 (H) 08/03/2019 0533   TRIG 140 08/03/2019 0533   HDL 55 08/03/2019 0533   CHOLHDL 5.6 08/03/2019 0533   VLDL 28 08/03/2019 0533   LDLCALC 223 (H) 08/03/2019 0533   HgbA1c:  Lab Results  Component Value Date   HGBA1C 14.2 (H) 08/03/2019   Urine Drug Screen:     Component Value Date/Time   LABOPIA NONE DETECTED 08/02/2019 0529   COCAINSCRNUR NONE DETECTED 08/02/2019 0529   LABBENZ NONE DETECTED 08/02/2019 0529   AMPHETMU NONE DETECTED 08/02/2019 0529   THCU NONE DETECTED 08/02/2019 0529   LABBARB NONE DETECTED 08/02/2019 0529    Alcohol Level     Component Value Date/Time   ETH <10 08/02/2019 0255    IMAGING  Dg Chest 1 View 08/02/2019 IMPRESSION:  No acute cardiopulmonary process.   Dg Lumbar Spine Complete 08/02/2019 IMPRESSION:  No acute fracture or malalignment.   Ct Head Wo Contrast 08/02/2019 IMPRESSION:  1. Recent appearing left superior cerebellar artery infarct.  2. No  intracranial hemorrhage or cervical spine fracture.   Ct Cervical Spine Wo Contrast 08/02/2019 IMPRESSION:  1. Recent appearing left superior cerebellar artery infarct.  2. No intracranial hemorrhage or cervical spine fracture.    Mr Brain 22 Contrast Mr Angio Head Wo Contrast 08/02/2019 IMPRESSION:  1. Moderately large acute left SCA territory infarct.  2. Chronic small vessel ischemic disease with chronic lacunar infarcts as above.  3. Advanced intracranial atherosclerosis with severe anterior and posterior circulation stenoses as above.   Vas US Carotid 08/03/2019 Summary:  Right Carotid: The extracranial vessels were near-normal with only minimal wall thickening or plaque.  Left Carotid: The extracranial vessels were near-normal with only minimal wall thickening or plaque.  Vertebrals:  Bilateral vertebral arteries demonstrate antegrade flow.  Subclavians: Normal flow hemodynamics were seen in bilateral subclavian arteries.  Final    Transthoracic Echocardiogram  08/02/19 IMPRESSIONS  1. Left ventricular ejection fraction, by visual estimation, is 60 to 65%. The left ventricle has normal function. Normal left ventricular size. There is moderately increased left ventricular hypertrophy.  2. Elevated mean left atrial pressure.  3. Left ventricular diastolic Doppler parameters are consistent with impaired relaxation pattern of LV diastolic filling.  4. Global right ventricle has normal systolic function.The right ventricular size is normal. No increase in right ventricular wall thickness.  5. Left atrial size was normal.  6. Right atrial size was normal.  7. The mitral valve is normal in structure. No evidence of mitral valve regurgitation. No evidence of mitral stenosis.  8. The tricuspid valve is normal in structure. Tricuspid valve regurgitation was not visualized by color flow Doppler.  9. The aortic valve is normal in structure. Aortic valve regurgitation was not visualized by  color flow Doppler. Structurally normal aortic valve, with no evidence of sclerosis or stenosis. 10. The pulmonic valve was normal in structure. Pulmonic valve regurgitation is not visualized by color flow Doppler. 11. The inferior vena cava is normal in size with greater than 50% respiratory variability, suggesting right atrial pressure of 3 mmHg. 12. No embolic source identified.    ECG - ST rate 100 BPM. (See cardiology reading for complete details)    PHYSICAL EXAM Blood pressure 130/66, pulse (!) 57, temperature 99.1 F (37.3 C), temperature source Oral, resp. rate 17, height 5\' 2"  (1.575 m), weight 77.1 kg, SpO2 94 %. Obese middle aged african american lady not in distress. . Afebrile. Head is nontraumatic. Neck is supple without bruit.    Cardiac exam no murmur or gallop. Lungs are clear to auscultation. Distal pulses are well felt.  Neurological Exam ;  Awake  Alert oriented x 3. Normal speech  Except mild dysathria and language.eye movements full without nystagmus.fundi were not visualized. Vision acuity and fields appear normal. Hearing is normal. Palatal movements are normal. Face symmetric. Tongue midline. Normal strength, tone, reflexes and coordination.  Truncal ataxia present normal sensation. Gait deferred.     ASSESSMENT/PLAN [name] is a 63 y.o. female with history of HLD, HTN, DM, depression and chronic low back pain presenting with acute persistent dizziness with recurrent falls for about 2 days associated with N&V resulting in inability to take her medications with subsequent hyperglycemia (515) and significantly elevated BP. 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  MRI head - Moderately  large acute left SCA territory infarct. Chronic small vessel ischemic disease with chronic lacunar infarcts  MRA head - Advanced intracranial atherosclerosis with severe anterior and posterior circulation stenoses  CTA H&N - not ordered  CT Perfusion - not ordered  Carotid Doppler - unremarkable  2D Echo - EF 60 - 65%. No cardiac source of emboli identified.   Sars Corona Virus 2 - negative  LDL - 223    Component Value Date/Time   LDLCALC 223 (H) 08/03/2019 0533    HgbA1c - 14.2  UDS - negative  VTE prophylaxis - Lovenox  Diet - Heart healthy / carb modified with thin liquids.  No antithrombotic prior to admission, now on aspirin 325 mg daily  Patient counseled to be compliant with her antithrombotic medications  Ongoing aggressive stroke risk factor management  Therapy recommendations: CLR  disposition: CLR Hypertension  Home BP meds: Amlodipine ; Tenormin ; Hydralazine  Current BP meds: none  Blood pressure somewhat high at times but within post stroke/TIA parameters . Permissive hypertension (OK  if < 220/120) but gradually normalize in 5-7 days . Long-term BP goal normotensive  Hyperlipidemia  Home Lipid lowering medication:  Liptor 20 mg daily  LDL 223, goal < 70  Current lipid lowering medication: consider Lipitor 80 mg daily   Continue statin at discharge  Diabetes  Home diabetic meds:  Current diabetic meds: insulin  HgbA1c 14.2, goal < 7.0 Recent Labs    08/03/19 1534 08/03/19 2122 08/04/19 0643  GLUCAP 329* 285* 235*    Other Stroke Risk Factors  Advanced age  Previous ETOH use  Obesity, Body mass index is 31.09 kg/m., recommend weight loss, diet and exercise as appropriate   Hx stroke/TIA by imaging  Other Active Problems  Recent N&V  AKI - creatinine - 1.28  Leukocytosis - 15.6 (afebrile)   Hospital day # 2   Continue aspirin and plavix x 3 weeks and then aspirin alone. Telemetry monitoring. TEE and loop next  week.  Greater than 50% time during this 25-minute visit was spent on counseling and coordination of care about her embolic stroke and discussion about evaluation, prevention and treatment and answering questions.D/W Dr Pola Cornahal and patient's daughter and answered questions Delia HeadyPramod Jihan Mellette, MD  To contact Stroke Continuity provider, please refer to [email or web address]. After hours, contact General Neurology

## 2019-08-04 NOTE — Progress Notes (Signed)
PROGRESS NOTE  Lauren Waller  DOB: 08/31/1956  PCP: Benito Mccreedy, MD CBJ:[id number]  DOA: 08/02/2019  LOS: 2 days   Brief narrative: Lauren Waller is a 63 y.o. female with PMH of HTN, DM2, HLD, low back pain.  Depression who lives alone at home. Patient presented to the ED on 08/02/2019 after having been found on the floor by her bed.   Apparently, for last several days, patient was having nausea, vomiting and persistent dizziness with recurrent falls.  She had a fall and could not get up by herself.  EMS was called.  Blood sugar was noted to be elevated to 515 and blood pressure was more than 160 systolic.  She is brought to the ED.   In the ED, patient was afebrile.  Blood pressure was elevated at 233/128. Work-up showed sodium 141, potassium 3.6, glucose 570, BUN/creatinine 29/1.28, WBC count 15.6, hemoglobin 14.9, platelet 408. Urinalysis showed hazy straw-colored urine protein, rare bacteria CT scan of head showed recent appearing left superior cerebellar artery infarct.  Patient was admitted under hospitalist service for acute cerebellar stroke. Neurology consultation was called. MRI of the brain, MRA head and neck showed: 1. Moderately large acute left SCA territory infarct. 2. Chronic small vessel ischemic disease with chronic lacunar infarcts. 3. Advanced intracranial atherosclerosis with severe anterior and posterior circulation stenoses. Echocardiogram showed EF 60 to 65% LVH and impaired relaxation. Carotid duplex did not show any significant stenosis.  Subjective: Patient was seen and examined this afternoon.  Pleasant middle-aged African-American female.  Lying down in bed.  Not in distress.  Blood pressure tends to fluctuate.   Assessment/Plan: Acute left superior cerebellar artery infarct Advanced intracranial atherosclerosis with severe anterior and posterior circulation stenosis -Presented with acute persistent dizziness, nausea, vomiting and  recurrent falls for 2 days -CT scan of head, MRI brain, MRA head and neck and carotid duplex findings as above. -No cardiac source of embolism found on echocardiogram -Neurology consultation obtained.  Started on aspirin 81 mg daily, Plavix 75 mg daily.  Continue aspirin and Plavix for 3 weeks and then aspirin alone.  Lipitor dose increased to 80 mg daily. -TEE and loop next week inpatient versus outpatient depending on disposition.  Will discuss with cardiology on Monday.  Hypertensive urgency -Blood pressure on arrival elevated to 233/128. -Chronically uncontrolled hypertension, likely cause of lacunar infarcts and microvascular changes in the brain. -Home meds include amlodipine 10 mg daily, atenolol 100 mg daily and hydralazine twice daily. -Permissive hypertension was allowed for first 24 hours.  Resumed all of her blood pressure medicines.  Also ordered for hydralazine IV PRN.  Acute kidney injury -Creatinine normal at baseline.  Presented with creatinine elevated to 1.28.  -Repeat creatinine tomorrow.  Leukocytosis -WBC elevated to 15.6 on admission, no evidence of infection. -Repeat CBC tomorrow.  Diabetes mellitus type 2 - A1c 14.2 -I do not see any insulin or other diabetes medications on her home list. -Currently patient is on Lantus 15 units daily and sliding scale insulin with Accu-Cheks. -May also add oral agents at discharge.  Hyperlipidemia -LDL 223, goal less than 70 -Home meds include Lipitor 20 mg daily.  Dose increased to 80 mg daily.  Body mass index is 31.09 kg/m. Mobility: Encourage ambulation.  PT eval ordered.  DVT prophylaxis: Lovenox subcu Code Status:  Code Status: Full Code  Family Communication: Expected Discharge: CIR recommended by PT.  Rehab consult placed.  Consultants:  Neurology  Procedures:    Antimicrobials: Anti-infectives (From admission, onward)  None      Diet Order            Diet heart healthy/carb modified  Fluid consistency: Thin  Diet effective ____              Infusions:    Scheduled Meds: . amLODipine  10 mg Oral Daily  . aspirin EC  81 mg Oral Daily  . atenolol  100 mg Oral Daily  . atorvastatin  40 mg Oral Daily  . clopidogrel  75 mg Oral Daily  . enoxaparin (LOVENOX) injection  40 mg Subcutaneous Q24H  . hydrALAZINE  100 mg Oral BID  . insulin aspart  0-15 Units Subcutaneous TID WC  . insulin aspart  0-5 Units Subcutaneous QHS  . insulin glargine  15 Units Subcutaneous Daily    PRN meds: acetaminophen **OR** acetaminophen (TYLENOL) oral liquid 160 mg/5 mL **OR** acetaminophen, hydrALAZINE, senna-docusate   Objective: Vitals:   08/04/19 1132 08/04/19 1249  BP: (!) 184/75 (!) 150/68  Pulse: 61   Resp: 18   Temp: 98.3 F (36.8 C)   SpO2: 96%     Intake/Output Summary (Last 24 hours) at 08/04/2019 1430 Last data filed at 08/04/2019 1133 Gross per 24 hour  Intake 402.5 ml  Output 600 ml  Net -197.5 ml   Filed Weights   08/02/19 1606  Weight: 77.1 kg   Weight change:  Body mass index is 31.09 kg/m.   Physical Exam: General exam: Appears calm and comfortable.  Skin: No rashes, lesions or ulcers. HEENT: Atraumatic, normocephalic, supple neck, no obvious bleeding Lungs: Clear to auscultation bilaterally CVS: Regular rate and rhythm, no murmur GI/Abd soft, nontender, nondistended, bowel sound present CNS: Alert, awake, oriented x3, Psychiatry: Mood appropriate Extremities: No pedal edema, no calf tenderness  Data Review: I have personally reviewed the laboratory data and studies available.  Recent Labs  Lab 08/02/19 0240 08/02/19 0558  WBC 15.6*  --   NEUTROABS 13.4*  --   HGB 14.9 14.6  HCT 47.8* 43.0  MCV 88.7  --   PLT 408*  --    Recent Labs  Lab 08/02/19 0240 08/02/19 0558  NA 141 144  K 3.6 3.5  CL 96*  --   CO2 24  --   GLUCOSE 570*  --   BUN 29*  --   CREATININE 1.28*  --   CALCIUM 10.0  --      Lorin Glass, MD  [facility] 08/04/2019

## 2019-08-04 NOTE — Evaluation (Signed)
Speech Language Pathology Evaluation Patient Details Name: Lauren Waller MRN: [id number] DOB: July 14, 1956 Today's Date: 08/04/2019 Time: 3710-6269 SLP Time Calculation (min) (ACUTE ONLY): 15 min  Problem List:  Patient Active Problem List   Diagnosis Date Noted  . 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 HLD (hyperlipidemia) 04/17/2018  . Hypertensive urgency 04/17/2018  . Facial cellulitis 04/17/2018  . Sepsis (HCC) 04/17/2018  . Diabetes mellitus without complication (HCC)   . Hypertension   . Varicose veins of bilateral lower extremities with other complications 08/29/2017   Past Medical History:  Past Medical History:  Diagnosis Date  . Chronic lower back pain   . Depression   . Facial cellulitis 04/17/2018  . HLD (hyperlipidemia)   . Hypertension   . Type II diabetes mellitus (HCC)    Past Surgical History:  Past Surgical History:  Procedure Laterality Date  . CATARACT EXTRACTION W/ INTRAOCULAR LENS  IMPLANT, BILATERAL Bilateral   . EYE SURGERY Bilateral    2016  . fatty tissue removal from upper back     1998  . INGUINAL HERNIA REPAIR Left   . MYOMECTOMY    . ROBOTIC ASSISTED LAPAROSCOPIC VAGINAL HYSTERECTOMY WITH FIBROID REMOVAL     2009   HPI:  This 63 y.o. female admitted after being found on the floor by her bed.  CT of head showed LT superior cerebellar artery infarct.  MRI showed moderately large acute Lt SCA territory infarct, chronic small vessel ischemic disease with chronic lacunar infarcts, and advanced intracranial atherosclerosis with severe anterior and posterior circulation stenosis.  PMH Includes: DM, HTN, depression, chronic LBP   Assessment / Plan / Recommendation Clinical Impression  Pt was seen for a cognitive-linguistic evaluation in the setting of a L SCA CVA.  Pt was encountered asleep in bed and she was lethargic throughout this evaluation.  Pt reported that she lives alone and that she is  independent with ADLs and IADLs (medications, finances, etc.) at baseline.  Pt exhibited functional receptive language and grossly functional expressive language; however, verbal perseveration was observed x1.  Pt's speech intelligibility was >90% intelligible to an unfamiliar listener.  Pt presents with cognitive deficits in the areas of short-term memory, attention, awareness, and problem solving.  Suspect that attention deficits are particularly affecting other cognitive abilities.  Of note, lethargy may have influenced the results of this evaluation.  Recommend home health ST and assistance with IADLs at time of discharge.  ST will follow for ongoing cognitive-linguistic and diagnostic treatment per POC.     SLP Assessment  SLP Recommendation/Assessment: Patient needs continued Speech Lanaguage Pathology Services SLP Visit Diagnosis: Cognitive communication deficit (R41.841)    Follow Up Recommendations  Home health SLP    Frequency and Duration min 2x/week  2 weeks      SLP Evaluation Cognition  Overall Cognitive Status: No family/caregiver present to determine baseline cognitive functioning Arousal/Alertness: Lethargic Orientation Level: Oriented X4 Attention: Sustained Sustained Attention: Impaired Sustained Attention Impairment: Verbal basic;Verbal complex;Functional complex Memory: Impaired Memory Impairment: Decreased short term memory Decreased Short Term Memory: Verbal complex Awareness: Impaired Problem Solving: Impaired Problem Solving Impairment: Verbal complex Safety/Judgment: Appears intact       Comprehension  Auditory Comprehension Overall Auditory Comprehension: Appears within functional limits for tasks assessed    Expression Expression Primary Mode of Expression: Verbal Verbal Expression Overall Verbal Expression: Impaired Automatic Speech: Name Level of Generative/Spontaneous Verbalization: Conversation Repetition: No impairment Naming: No  impairment Pragmatics: No impairment Interfering Components: Attention Other Verbal  Expression Comments: Verbal perseveration observed x1  Written Expression Dominant Hand: Right   Oral / Motor  Oral Motor/Sensory Function Overall Oral Motor/Sensory Function: Within functional limits Motor Speech Overall Motor Speech: Appears within functional limits for tasks assessed   [name], M.S., [place] Office: [phone]                    [place] 08/04/2019, 4:22 PM

## 2019-08-04 NOTE — Progress Notes (Signed)
Report given to Ala, RN on 3W. Patient transferred to 4B34. All belonging sent. Family notified.  Ave Filter, RN

## 2019-08-05 LAB — BASIC METABOLIC PANEL
Anion gap: 10 (ref 5–15)
BUN: 18 mg/dL (ref 8–23)
CO2: 28 mmol/L (ref 22–32)
Calcium: 8.7 mg/dL — ABNORMAL LOW (ref 8.9–10.3)
Chloride: 99 mmol/L (ref 98–111)
Creatinine, Ser: 0.72 mg/dL (ref 0.44–1.00)
GFR calc Af Amer: >60 mL/min (ref >60)
GFR calc non Af Amer: >60 mL/min (ref >60)
Glucose, Bld: 184 mg/dL — ABNORMAL HIGH (ref 70–99)
Potassium: 2.5 mmol/L — CL (ref 3.5–5.1)
Sodium: 137 mmol/L (ref 135–145)

## 2019-08-05 LAB — CBC WITH DIFFERENTIAL/PLATELET
Abs Immature Granulocytes: 0.03 10*3/uL (ref 0.00–0.07)
Basophils Absolute: 0 10*3/uL (ref 0.0–0.1)
Basophils Relative: 0 %
Eosinophils Absolute: 0.1 10*3/uL (ref 0.0–0.5)
Eosinophils Relative: 1 %
HCT: 39 % (ref 36.0–46.0)
Hemoglobin: 12.5 g/dL (ref 12.0–15.0)
Immature Granulocytes: 0 %
Lymphocytes Relative: 32 %
Lymphs Abs: 2.7 10*3/uL (ref 0.7–4.0)
MCH: 27.6 pg (ref 26.0–34.0)
MCHC: 32.1 g/dL (ref 30.0–36.0)
MCV: 86.1 fL (ref 80.0–100.0)
Monocytes Absolute: 0.6 10*3/uL (ref 0.1–1.0)
Monocytes Relative: 8 %
Neutro Abs: 4.8 10*3/uL (ref 1.7–7.7)
Neutrophils Relative %: 59 %
Platelets: 283 10*3/uL (ref 150–400)
RBC: 4.53 MIL/uL (ref 3.87–5.11)
RDW: 12.4 % (ref 11.5–15.5)
WBC: 8.2 10*3/uL (ref 4.0–10.5)
nRBC: 0 % (ref 0.0–0.2)

## 2019-08-05 LAB — GLUCOSE, CAPILLARY
Glucose-Capillary: 172 mg/dL — ABNORMAL HIGH (ref 70–99)
Glucose-Capillary: 143 mg/dL — ABNORMAL HIGH (ref 70–99)
Glucose-Capillary: 282 mg/dL — ABNORMAL HIGH (ref 70–99)
Glucose-Capillary: 298 mg/dL — ABNORMAL HIGH (ref 70–99)

## 2019-08-05 LAB — MAGNESIUM: Magnesium: 1.7 mg/dL (ref 1.7–2.4)

## 2019-08-05 MED ORDER — POTASSIUM CHLORIDE CRYS ER 20 MEQ PO TBCR
40.0000 meq | EXTENDED_RELEASE_TABLET | Freq: Two times a day (BID) | ORAL | Status: AC
  Administered 2019-08-05 (×2): 40 meq via ORAL
  Filled 2019-08-05 (×2): qty 2

## 2019-08-05 MED ORDER — SODIUM CHLORIDE 0.9 % IV SOLN
INTRAVENOUS | Status: DC
  Administered 2019-08-06: 01:00:00 via INTRAVENOUS

## 2019-08-05 MED ORDER — GLIPIZIDE ER 2.5 MG PO TB24
2.5000 mg | ORAL_TABLET | Freq: Every day | ORAL | Status: DC
  Administered 2019-08-05 – 2019-08-10 (×6): 2.5 mg via ORAL
  Filled 2019-08-05 (×7): qty 1

## 2019-08-05 NOTE — Progress Notes (Signed)
STROKE TEAM PROGRESS NOTE      INTERVAL HISTORY Her daughter is at the bedside.  I reviewed with her patient's neurological presentation, findings from stroke evaluation and discuss plan of care and answered questions.  Patient still has significant truncal ataxia.    OBJECTIVE Vitals:   08/05/19 0026 08/05/19 0408 08/05/19 0750 08/05/19 1117  BP: (!) 160/77 (!) 166/79 (!) 146/70 (!) 139/120  Pulse: 64 60 (!) 59 62  Resp: 16 17 17 17   Temp: 98 F (36.7 C) 97.8 F (36.6 C) 98.6 F (37 C) (!) 97.5 F (36.4 C)  TempSrc: Oral Oral Oral Oral  SpO2: 99% 100% 96% 98%  Weight:      Height:        CBC:  Recent Labs  Lab 08/02/19 0240 08/02/19 0558 08/05/19 0521  WBC 15.6*  --  8.2  NEUTROABS 13.4*  --  4.8  HGB 14.9 14.6 12.5  HCT 47.8* 43.0 39.0  MCV 88.7  --  86.1  PLT 408*  --  283    Basic Metabolic Panel:  Recent Labs  Lab 08/02/19 0240 08/02/19 0558 08/05/19 0521  NA 141 144 137  K 3.6 3.5 2.5*  CL 96*  --  99  CO2 24  --  28  GLUCOSE 570*  --  184*  BUN 29*  --  18  CREATININE 1.28*  --  0.72  CALCIUM 10.0  --  8.7*  MG  --   --  1.7    Lipid Panel:     Component Value Date/Time   CHOL 306 (H) 08/03/2019 0533   TRIG 140 08/03/2019 0533   HDL 55 08/03/2019 0533   CHOLHDL 5.6 08/03/2019 0533   VLDL 28 08/03/2019 0533   LDLCALC 223 (H) 08/03/2019 0533   HgbA1c:  Lab Results  Component Value Date   HGBA1C 14.2 (H) 08/03/2019   Urine Drug Screen:     Component Value Date/Time   LABOPIA NONE DETECTED 08/02/2019 0529   COCAINSCRNUR NONE DETECTED 08/02/2019 0529   LABBENZ NONE DETECTED 08/02/2019 0529   AMPHETMU NONE DETECTED 08/02/2019 0529   THCU NONE DETECTED 08/02/2019 0529   LABBARB NONE DETECTED 08/02/2019 0529    Alcohol Level     Component Value Date/Time   ETH <10 08/02/2019 0255    IMAGING  Dg Chest 1 View 08/02/2019 IMPRESSION:  No acute cardiopulmonary process.   Dg Lumbar Spine Complete 08/02/2019 IMPRESSION:  No  acute fracture or malalignment.   Ct Head Wo Contrast 08/02/2019 IMPRESSION:  1. Recent appearing left superior cerebellar artery infarct.  2. No intracranial hemorrhage or cervical spine fracture.   Ct Cervical Spine Wo Contrast 08/02/2019 IMPRESSION:  1. Recent appearing left superior cerebellar artery infarct.  2. No intracranial hemorrhage or cervical spine fracture.    Mr Brain 21Wo Contrast Mr Angio Head Wo Contrast 08/02/2019 IMPRESSION:  1. Moderately large acute left SCA territory infarct.  2. Chronic small vessel ischemic disease with chronic lacunar infarcts as above.  3. Advanced intracranial atherosclerosis with severe anterior and posterior circulation stenoses as above.   [place]as [place] Carotid 08/03/2019 Summary:  Right Carotid: The extracranial vessels were near-normal with only minimal wall thickening or plaque.  Left Carotid: The extracranial vessels were near-normal with only minimal wall thickening or plaque.  Vertebrals:  Bilateral vertebral arteries demonstrate antegrade flow.  Subclavians: Normal flow hemodynamics were seen in bilateral subclavian arteries.  Final    Transthoracic Echocardiogram  08/02/19 IMPRESSIONS  1. Left ventricular ejection  fraction, by visual estimation, is 60 to 65%. The left ventricle has normal function. Normal left ventricular size. There is moderately increased left ventricular hypertrophy.  2. Elevated mean left atrial pressure.  3. Left ventricular diastolic Doppler parameters are consistent with impaired relaxation pattern of LV diastolic filling.  4. Global right ventricle has normal systolic function.The right ventricular size is normal. No increase in right ventricular wall thickness.  5. Left atrial size was normal.  6. Right atrial size was normal.  7. The mitral valve is normal in structure. No evidence of mitral valve regurgitation. No evidence of mitral stenosis.  8. The tricuspid valve is normal in structure. Tricuspid valve  regurgitation was not visualized by color flow Doppler.  9. The aortic valve is normal in structure. Aortic valve regurgitation was not visualized by color flow Doppler. Structurally normal aortic valve, with no evidence of sclerosis or stenosis. 10. The pulmonic valve was normal in structure. Pulmonic valve regurgitation is not visualized by color flow Doppler. 11. The inferior vena cava is normal in size with greater than 50% respiratory variability, suggesting right atrial pressure of 3 mmHg. 12. No embolic source identified.    ECG - ST rate 100 BPM. (See cardiology reading for complete details)    PHYSICAL EXAM Blood pressure (!) 139/120, pulse 62, temperature (!) 97.5 F (36.4 C), temperature source Oral, resp. rate 17, height 5\' 2"  (1.575 m), weight 77.1 kg, SpO2 98 %. Obese middle aged african american lady not in distress. . Afebrile. Head is nontraumatic. Neck is supple without bruit.    Cardiac exam no murmur or gallop. Lungs are clear to auscultation. Distal pulses are well felt.  Neurological Exam ;  Awake  Alert oriented x 3. Normal speech  Except mild dysathria and language.eye movements full without nystagmus.fundi were not visualized. Vision acuity and fields appear normal. Hearing is normal. Palatal movements are normal. Face symmetric. Tongue midline. Normal strength, tone, reflexes and coordination.  Truncal ataxia present normal sensation. Gait deferred.     ASSESSMENT/PLAN Ms. Ramesha Poster is a 63 y.o. female with history of HLD, HTN, DM, depression and chronic low back pain presenting with acute persistent dizziness with recurrent falls for about 2 days associated with N&V resulting in inability to take her medications with subsequent hyperglycemia (515) and significantly elevated BP. 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  MRI head - Moderately large acute left SCA territory infarct. Chronic small vessel ischemic disease with chronic lacunar infarcts  MRA head - Advanced intracranial atherosclerosis with severe anterior and posterior circulation stenoses  CTA H&N - not ordered  CT Perfusion - not ordered  Carotid Doppler - unremarkable  2D Echo - EF 60 - 65%. No cardiac source of emboli identified.   Sars Corona Virus 2 - negative  LDL - 223    Component Value Date/Time   LDLCALC 223 (H) 08/03/2019 0533    HgbA1c - 14.2  UDS - negative  VTE prophylaxis - Lovenox  Diet - Heart healthy / carb modified with thin liquids.  No antithrombotic prior to admission, now on aspirin 325 mg daily  Patient counseled to be compliant with her antithrombotic medications  Ongoing aggressive stroke risk factor management  Therapy recommendations: CLR  disposition: CLR Hypertension  Home BP meds: Amlodipine ; Tenormin ; Hydralazine  Current BP meds: none  Blood pressure somewhat high at times but within post stroke/TIA parameters . Permissive hypertension (OK if < 220/120) but gradually normalize in 5-7 days . Long-term BP goal normotensive  Hyperlipidemia  Home Lipid lowering medication:  Liptor 20 mg daily  LDL 223, goal < 70  Current lipid lowering medication: consider Lipitor 80 mg daily   Continue statin at discharge  Diabetes  Home diabetic meds:  Current diabetic meds: insulin  HgbA1c 14.2, goal < 7.0 Recent Labs    08/04/19 2130 08/05/19 0628 08/05/19 1113  GLUCAP 322* 172* 143*    Other Stroke Risk Factors  Advanced age  Previous ETOH use  Obesity, Body mass index is 31.09 kg/m., recommend weight loss, diet and exercise as appropriate   Hx stroke/TIA by imaging  Other Active Problems  Recent N&V  AKI - creatinine -  1.28  Leukocytosis - 15.6 (afebrile)   Hospital day # 3   Continue aspirin and plavix x 3 weeks and then aspirin alone. Telemetry monitoring. TEE and loop  Cannot be done until tomorrow due to full schedule..   D/W Dr Pietro Cassis and patient's sister and answered questions Antony Contras, MD  To contact Stroke Continuity provider, please refer to [email or web address] After hours, contact General Neurology

## 2019-08-05 NOTE — Progress Notes (Signed)
PROGRESS NOTE  Lauren Waller  DOB: [birth year]  PCP: Benito Mccreedy, MD OZD:[id number]  DOA: 08/02/2019  LOS: 3 days   Brief narrative: Lauren Waller is a 62 y.o. female with PMH of HTN, DM2, HLD, low back pain.  Depression who lives alone at home. Patient presented to the ED on 08/02/2019 after having been found on the floor by her bed.   Apparently, for last several days, patient was having nausea, vomiting and persistent dizziness with recurrent falls.  She had a fall and could not get up by herself.  EMS was called.  Blood sugar was noted to be elevated to 515 and blood pressure was more than 259 systolic.  She is brought to the ED.   In the ED, patient was afebrile.  Blood pressure was elevated at 233/128. Work-up showed sodium 141, potassium 3.6, glucose 570, BUN/creatinine 29/1.28, WBC count 15.6, hemoglobin 14.9, platelet 408. Urinalysis showed hazy straw-colored urine protein, rare bacteria CT scan of head showed recent appearing left superior cerebellar artery infarct.  Patient was admitted under hospitalist service for acute cerebellar stroke. Neurology consultation was called. MRI of the brain, MRA head and neck showed: 1. Moderately large acute left SCA territory infarct. 2. Chronic small vessel ischemic disease with chronic lacunar infarcts. 3. Advanced intracranial atherosclerosis with severe anterior and posterior circulation stenoses. Echocardiogram showed EF 60 to 65% LVH and impaired relaxation. Carotid duplex did not show any significant stenosis.  Subjective: Patient was seen and examined this afternoon.  Pleasant middle-aged African-American female.  Lying down in bed.  Not in distress.  Blood pressure more stable. Potassium low this morning at 2.5.  Replaced.  Assessment/Plan: Acute left superior cerebellar artery infarct Advanced intracranial atherosclerosis with severe anterior and posterior circulation stenosis -Presented with acute  persistent dizziness, nausea, vomiting and recurrent falls for 2 days -CT scan of head, MRI brain, MRA head and neck and carotid duplex findings as above. -No cardiac source of embolism found on echocardiogram -Neurology consultation obtained.  Started on aspirin 81 mg daily, Plavix 75 mg daily.  Continue aspirin and Plavix for 3 weeks and then aspirin alone.  Lipitor dose increased to 80 mg daily. -TEE ordered.  Anticipate tomorrow.   Hypertensive urgency -Blood pressure on arrival elevated to 233/128. -Chronically uncontrolled hypertension, likely cause of lacunar infarcts and microvascular changes in the brain. -Home meds include amlodipine 10 mg daily, atenolol 100 mg daily and hydralazine twice daily. -Permissive hypertension was allowed for first 24 hours.  Resumed all of her blood pressure medicines.  Also ordered for hydralazine IV PRN. -Blood pressure improving but not yet down to normal.  Acute kidney injury -Creatinine normal at baseline.  Presented with creatinine elevated to 1.28.  -Repeat creatinine shows an improvement to normal.  Hypokalemia -Potassium 2.5. -Replaced with 40 mEq x 2 today. -Repeat potassium tomorrow.  Leukocytosis -WBC elevated to 15.6 on admission, no evidence of infection. -Repeat CBC shows an improvement to normal.  Diabetes mellitus type 2 - A1c 14.2 -I do not see any insulin or other diabetes medications on her home list. -Currently patient is on Lantus 15 units daily and sliding scale insulin with Accu-Cheks. -Diabetes coordinator consulted. -Start the patient on glipizide 2.5 mg daily this morning.    Hyperlipidemia -LDL 223, goal less than 70 -Home meds include Lipitor 20 mg daily.  Dose increased to 80 mg daily.  Mobility: Encourage ambulation. PT eval ordered.  DVT prophylaxis: Lovenox subcu Code Status:Code Status: Full Code  Family Communication: Expected  Discharge: CIR recommended by PT.  Rehab consult placed.   Consultants:  Neurology  Procedures:    Antimicrobials: Anti-infectives (From admission, onward)   None      Diet Order            Diet Heart Room service appropriate? Yes; Fluid consistency: Thin  Diet effective now              Infusions:    Scheduled Meds: . amLODipine  10 mg Oral Daily  . aspirin EC  81 mg Oral Daily  . atenolol  100 mg Oral Daily  . atorvastatin  40 mg Oral Daily  . clopidogrel  75 mg Oral Daily  . enoxaparin (LOVENOX) injection  40 mg Subcutaneous Q24H  . glipiZIDE  2.5 mg Oral Q breakfast  . hydrALAZINE  100 mg Oral BID  . insulin aspart  0-15 Units Subcutaneous TID WC  . insulin aspart  0-5 Units Subcutaneous QHS  . insulin glargine  15 Units Subcutaneous Daily  . potassium chloride  40 mEq Oral BID    PRN meds: acetaminophen **OR** acetaminophen (TYLENOL) oral liquid 160 mg/5 mL **OR** acetaminophen, hydrALAZINE, senna-docusate   Objective: Vitals:   08/05/19 0750 08/05/19 1117  BP: (!) 146/70 (!) 139/120  Pulse: (!) 59 62  Resp: 17 17  Temp: 98.6 F (37 C) (!) 97.5 F (36.4 C)  SpO2: 96% 98%    Intake/Output Summary (Last 24 hours) at 08/05/2019 1328 Last data filed at 08/05/2019 0600 Gross per 24 hour  Intake -  Output 800 ml  Net -800 ml   Filed Weights   08/02/19 1606  Weight: 77.1 kg   Weight change:  Body mass index is 31.09 kg/m.   Physical Exam: General exam: Appears calm and comfortable.  Skin: No rashes, lesions or ulcers. HEENT: Atraumatic, normocephalic, supple neck, no obvious bleeding Lungs: Clear to auscultation bilaterally CVS: Regular rate and rhythm, no murmur GI/Abd soft, nontender, nondistended, bowel sound present CNS: Alert, awake, oriented x3, Psychiatry: Mood appropriate Extremities: No pedal edema, no calf tenderness  Data Review: I have personally reviewed the laboratory data and studies available.  Recent Labs  Lab 08/02/19 0240 08/02/19 0558 08/05/19 0521  WBC 15.6*  --   8.2  NEUTROABS 13.4*  --  4.8  HGB 14.9 14.6 12.5  HCT 47.8* 43.0 39.0  MCV 88.7  --  86.1  PLT 408*  --  283   Recent Labs  Lab 08/02/19 0240 08/02/19 0558 08/05/19 0521  NA 141 144 137  K 3.6 3.5 2.5*  CL 96*  --  99  CO2 24  --  28  GLUCOSE 570*  --  184*  BUN 29*  --  18  CREATININE 1.28*  --  0.72  CALCIUM 10.0  --  8.7*  MG  --   --  1.7     Lorin Glass, MD  [facility] 08/05/2019

## 2019-08-05 NOTE — Care Management Important Message (Signed)
Important Message  Patient Details  Name: Lauren Waller MRN: [id number] Date of Birth: 08/23/1956   Medicare Important Message Given:  Yes     Orbie Pyo 08/05/2019, 3:26 PM

## 2019-08-05 NOTE — Consult Note (Signed)
Inpatient Rehab Admissions:  Inpatient Rehab Consult received.  I met with pt at the bedside for rehabilitation assessment. Discussed IP Rehab program with pt who appears interested. AC discussed that we will need to see who can assist her at DC as that may determine which rehab venue is most appropriate. Pt states her adopted daughter Gwenyth Ober or niece Darylene Price may be able to assist. [facility] is awaiting call back to determine if pt has the caregiver assistance needed to support an IP Rehab stay.   Will follow up.   Jhonnie Garner, OTR/L  Rehab Admissions Coordinator  [phone] 08/05/2019 2:13 PM

## 2019-08-05 NOTE — Progress Notes (Addendum)
Inpatient Diabetes Program Recommendations  AACE/ADA: New Consensus Statement on Inpatient Glycemic Control (2015)  Target Ranges:  Prepandial:   less than 140 mg/dL      Peak postprandial:   less than 180 mg/dL (1-2 hours)      Critically ill patients:  140 - 180 mg/dL   Lab Results  Component Value Date   GLUCAP 143 (H) 08/05/2019   HGBA1C 14.2 (H) 08/03/2019    Spoke with patient at bedside regarding A1c level and glucose control at home. Discussed currently A1c level. Patient says she was prescribed insulin but did not take it. Patient reports taking insulin in the past via insulin pen.   Patient is motivated to take her insulin at home and to do what she needs to. Discussed importance of glucose control and checking her glucose levels everyday. Discussed glucose and A1c goals.   Patient reports she has plenty of glucose supplies at home for her glucose meter.  Thanks,  Tama Headings RN, MSN, BC-ADM Inpatient Diabetes Coordinator Team Pager [phone] (8a-5p)

## 2019-08-05 NOTE — Progress Notes (Signed)
    [facility] has been requested to perform a transesophageal echocardiogram on Lauren Waller for stroke.  After careful review of history and examination, the risks and benefits of transesophageal echocardiogram have been explained including risks of esophageal damage, perforation (1:10,000 risk), bleeding, pharyngeal hematoma as well as other potential complications associated with conscious sedation including aspiration, arrhythmia, respiratory failure and death. Alternatives to treatment were discussed, questions were answered. Patient is willing to proceed. Called and spoke to patient's brother, Dairl Ponder, who is POA and he also confirmed it is OK to proceed with procedure. [place] phone number [phone].  Jachob Mcclean Ninfa Meeker, PA-C  08/05/2019 3:19 PM

## 2019-08-05 NOTE — Progress Notes (Signed)
Paged on call regarding critical value of K=2.5, awaiting feedback

## 2019-08-05 NOTE — Progress Notes (Signed)
Nutrition Brief Note  RD consulted for assessment of nutrition requirements/status, CVA.  Wt Readings from Last 15 Encounters:  08/02/19 77.1 kg  04/17/18 77.1 kg  08/29/17 77.5 kg  12/28/16 78 kg    Body mass index is 31.09 kg/m. Patient meets criteria for class I obesity based on current BMI.   Current diet order is heart healthy, patient is consuming approximately 75-90% of meals at this time. Pt reports having a good appetite with no difficulties. Pt reports usually eating well at home with usual consumption of at least 3 meals a day.Labs and medications reviewed.   No nutrition interventions warranted at this time. If nutrition issues arise, please consult RD.   Lauren Parker, MS, RD, LDN Pager # [phone] After hours/ weekend pager # [phone]

## 2019-08-05 NOTE — Progress Notes (Signed)
Inpatient Diabetes Program Recommendations  AACE/ADA: New Consensus Statement on Inpatient Glycemic Control (2015)  Target Ranges:  Prepandial:   less than 140 mg/dL      Peak postprandial:   less than 180 mg/dL (1-2 hours)      Critically ill patients:  140 - 180 mg/dL   Lab Results  Component Value Date   GLUCAP 172 (H) 08/05/2019   HGBA1C 14.2 (H) 08/03/2019    Review of Glycemic Control Results for Lauren Waller, Lauren Waller (MRN [id number]) as of 08/05/2019 09:58  Ref. Range 08/04/2019 06:43 08/04/2019 11:12 08/04/2019 17:04 08/04/2019 21:30 08/05/2019 06:28  Glucose-Capillary Latest Ref Range: 70 - 99 mg/dL 235 (H) 179 (H) 293 (H) 322 (H) 172 (H)   Diabetes history: DM 2 Outpatient Diabetes medications: Lantus 15 units Current orders for Inpatient glycemic control:  Lantus 15 units Novolog 0-15 units tid + hs  Inpatient Diabetes Program Recommendations:    Glucose trends increase at meal times. May consider Glipizide 2.5-5 mg Daily starting today to control prandial glucose excursions.  Pt reported on admission due to N/V symptoms was not taking Lantus. Will speak with pt regarding A1c level.  Thanks,  Tama Headings RN, MSN, BC-ADM Inpatient Diabetes Coordinator Team Pager [phone] (8a-5p)

## 2019-08-06 ENCOUNTER — Ambulatory Visit: Payer: Medicare Other | Admitting: Vascular Surgery

## 2019-08-06 ENCOUNTER — Encounter (HOSPITAL_COMMUNITY): Payer: Self-pay | Admitting: Internal Medicine

## 2019-08-06 ENCOUNTER — Inpatient Hospital Stay (HOSPITAL_COMMUNITY): Payer: Medicare Other

## 2019-08-06 ENCOUNTER — Encounter (HOSPITAL_COMMUNITY)
Admission: EM | Disposition: A | Discharge status: Skilled Nursing Facility | Payer: Self-pay | Source: Home / Self Care | Attending: Internal Medicine

## 2019-08-06 DIAGNOSIS — I639 Cerebral infarction, unspecified: Secondary | ICD-10-CM

## 2019-08-06 HISTORY — PX: BUBBLE STUDY: SHX6837

## 2019-08-06 HISTORY — PX: TEE WITHOUT CARDIOVERSION: SHX5443

## 2019-08-06 LAB — GLUCOSE, CAPILLARY
Glucose-Capillary: 239 mg/dL — ABNORMAL HIGH (ref 70–99)
Glucose-Capillary: 126 mg/dL — ABNORMAL HIGH (ref 70–99)
Glucose-Capillary: 218 mg/dL — ABNORMAL HIGH (ref 70–99)
Glucose-Capillary: 197 mg/dL — ABNORMAL HIGH (ref 70–99)
Glucose-Capillary: 229 mg/dL — ABNORMAL HIGH (ref 70–99)

## 2019-08-06 LAB — CBC WITH DIFFERENTIAL/PLATELET
Abs Immature Granulocytes: 0.06 10*3/uL (ref 0.00–0.07)
Basophils Absolute: 0 10*3/uL (ref 0.0–0.1)
Basophils Relative: 0 %
Eosinophils Absolute: 0 10*3/uL (ref 0.0–0.5)
Eosinophils Relative: 1 %
HCT: 39.3 % (ref 36.0–46.0)
Hemoglobin: 12.8 g/dL (ref 12.0–15.0)
Immature Granulocytes: 1 %
Lymphocytes Relative: 25 %
Lymphs Abs: 2.1 10*3/uL (ref 0.7–4.0)
MCH: 28 pg (ref 26.0–34.0)
MCHC: 32.6 g/dL (ref 30.0–36.0)
MCV: 86 fL (ref 80.0–100.0)
Monocytes Absolute: 0.6 10*3/uL (ref 0.1–1.0)
Monocytes Relative: 8 %
Neutro Abs: 5.5 10*3/uL (ref 1.7–7.7)
Neutrophils Relative %: 65 %
Platelets: 265 10*3/uL (ref 150–400)
RBC: 4.57 MIL/uL (ref 3.87–5.11)
RDW: 12.4 % (ref 11.5–15.5)
WBC: 8.4 10*3/uL (ref 4.0–10.5)
nRBC: 0 % (ref 0.0–0.2)

## 2019-08-06 LAB — BASIC METABOLIC PANEL
Anion gap: 9 (ref 5–15)
BUN: 16 mg/dL (ref 8–23)
CO2: 27 mmol/L (ref 22–32)
Calcium: 8.6 mg/dL — ABNORMAL LOW (ref 8.9–10.3)
Chloride: 102 mmol/L (ref 98–111)
Creatinine, Ser: 0.67 mg/dL (ref 0.44–1.00)
GFR calc Af Amer: >60 mL/min (ref >60)
GFR calc non Af Amer: >60 mL/min (ref >60)
Glucose, Bld: 128 mg/dL — ABNORMAL HIGH (ref 70–99)
Potassium: 3.7 mmol/L (ref 3.5–5.1)
Sodium: 138 mmol/L (ref 135–145)

## 2019-08-06 SURGERY — ECHOCARDIOGRAM, TRANSESOPHAGEAL
Anesthesia: Moderate Sedation

## 2019-08-06 MED ORDER — FENTANYL CITRATE (PF) 100 MCG/2ML IJ SOLN
INTRAMUSCULAR | Status: AC
  Filled 2019-08-06: qty 4

## 2019-08-06 MED ORDER — POTASSIUM CHLORIDE CRYS ER 20 MEQ PO TBCR
40.0000 meq | EXTENDED_RELEASE_TABLET | Freq: Once | ORAL | Status: AC
  Administered 2019-08-06: 40 meq via ORAL
  Filled 2019-08-06: qty 2

## 2019-08-06 MED ORDER — LIDOCAINE VISCOUS HCL 2 % MT SOLN
OROMUCOSAL | Status: DC | PRN
  Administered 2019-08-06: 20 mL via OROMUCOSAL

## 2019-08-06 MED ORDER — HYDROCHLOROTHIAZIDE 12.5 MG PO CAPS
12.5000 mg | ORAL_CAPSULE | Freq: Every day | ORAL | Status: DC
  Administered 2019-08-06 – 2019-08-07 (×2): 12.5 mg via ORAL
  Filled 2019-08-06 (×4): qty 1

## 2019-08-06 MED ORDER — POTASSIUM CHLORIDE CRYS ER 20 MEQ PO TBCR
20.0000 meq | EXTENDED_RELEASE_TABLET | Freq: Every day | ORAL | Status: DC
  Administered 2019-08-06 – 2019-08-07 (×2): 20 meq via ORAL
  Filled 2019-08-06 (×3): qty 1

## 2019-08-06 MED ORDER — FENTANYL CITRATE (PF) 100 MCG/2ML IJ SOLN
INTRAMUSCULAR | Status: DC | PRN
  Administered 2019-08-06: 25 ug via INTRAVENOUS

## 2019-08-06 MED ORDER — LIDOCAINE VISCOUS HCL 2 % MT SOLN
OROMUCOSAL | Status: AC
  Filled 2019-08-06: qty 15

## 2019-08-06 MED ORDER — MIDAZOLAM HCL (PF) 10 MG/2ML IJ SOLN
INTRAMUSCULAR | Status: DC | PRN
  Administered 2019-08-06: 2 mg via INTRAVENOUS

## 2019-08-06 MED ORDER — BUTAMBEN-TETRACAINE-BENZOCAINE 2-2-14 % EX AERO
INHALATION_SPRAY | CUTANEOUS | Status: DC | PRN
  Administered 2019-08-06: 2 via TOPICAL

## 2019-08-06 MED ORDER — MIDAZOLAM HCL (PF) 5 MG/ML IJ SOLN
INTRAMUSCULAR | Status: AC
  Filled 2019-08-06: qty 2

## 2019-08-06 MED ORDER — INSULIN GLARGINE 100 UNIT/ML ~~LOC~~ SOLN
20.0000 [IU] | Freq: Every day | SUBCUTANEOUS | Status: DC
  Administered 2019-08-06 – 2019-08-10 (×5): 20 [IU] via SUBCUTANEOUS
  Filled 2019-08-06 (×5): qty 0.2

## 2019-08-06 NOTE — Progress Notes (Signed)
Inpatient Rehabilitation-Admissions Coordinator   Spoke with pt's niece and brother via phone today. We discussed anticipated caregiver support and estimated length of stay if this patient was to come to CIR. Unfortunately, this patient does not have the recommended caregiver assist to support an IP Rehab stay. Both her niece and her brother favor SNF at this time to allow her more time for recovery and are hopeful that she will be able to transition back home at an Independent level.    AC will sign off and will communicate recommendation to SW.

## 2019-08-06 NOTE — Progress Notes (Signed)
Physical Therapy Treatment Patient Details Name: Lauren Waller MRN: [id number] DOB: December 01, 1955 Today's Date: 08/06/2019    History of Present Illness This 63 y.o. female admitted after being found on the floor by her bed.  CT of head showed LT superior cerebellar artery infarct.  MRI showed moderately large acute Lt SCA terrirtory infarct, chronic small vessel ischemic disease with chronic lacunar infarcts, and advanced intracranial atherosclerosis with severe anterior and posterior circulation stenosis.  PMH Includes: DM, HTN, depression, chronic LBP    PT Comments    Pt lethargic and intemittently alert and sleepy.  Pt remains impulsive and limited to sitting edge of bed and returning back to bed as she became increasingly tired.  Difficult to assess function based on presentation.  Will f/u per POC.     Follow Up Recommendations  CIR;Supervision/Assistance - 24 hour     Equipment Recommendations  None recommended by PT    Recommendations for Other Services Rehab consult     Precautions / Restrictions Precautions Precautions: Fall Precaution Comments: pt ataxic - pt reports frequent recent falls Restrictions Weight Bearing Restrictions: No    Mobility  Bed Mobility Overal bed mobility: Needs Assistance Bed Mobility: Supine to Sit;Sit to Supine;Rolling Rolling: Min assist   Supine to sit: Mod assist Sit to supine: Mod assist   General bed mobility comments: Pt require assistance for LE advancement and trunk elevation.  She sat edge of bed x 3-4 min before presenting with LOB posterior and required cues to alert before falling backwards.  Pt required assistance to come back to sitting and scoot along side edge of bed to prepare for return back to bed.  Once in bed noted urinary incontinence and changed bed pads.  Transfers                    Ambulation/Gait                 Stairs             Wheelchair Mobility    Modified Rankin  (Stroke Patients Only)       Balance     Sitting balance-Leahy Scale: Poor Sitting balance - Comments: LOB posterior after sitting unassisted edge of bed x3-4 mins.                                    Cognition Arousal/Alertness: Lethargic;Suspect due to medications Behavior During Therapy: Flat affect;Impulsive Overall Cognitive Status: No family/caregiver present to determine baseline cognitive functioning Area of Impairment: Attention;Awareness;Problem solving;Following commands;Safety/judgement                   Current Attention Level: Sustained   Following Commands: Follows one step commands inconsistently;Follows one step commands with increased time Safety/Judgement: Decreased awareness of safety;Decreased awareness of deficits   Problem Solving: Slow processing;Difficulty sequencing;Requires verbal cues;Requires tactile cues General Comments: Pt is very slow to respond initially, follows simple commands inconsistently.  she is very impulsive with poor safety awareness.  pt sleepy - lethargic.  she says she is a night person      Exercises      General Comments        Pertinent Vitals/Pain Pain Assessment: Faces Faces Pain Scale: No hurt    Home Living                      Prior Function  PT Goals (current goals can now be found in the care plan section) Acute Rehab PT Goals Patient Stated Goal: pt did not state  Potential to Achieve Goals: Fair Progress towards PT goals: Progressing toward goals    Frequency    Min 3X/week      PT Plan Current plan remains appropriate    Co-evaluation              AM-PAC PT "6 Clicks" Mobility   Outcome Measure  Help needed turning from your back to your side while in a flat bed without using bedrails?: A Lot Help needed moving from lying on your back to sitting on the side of a flat bed without using bedrails?: A Lot Help needed moving to and from a bed to a  chair (including a wheelchair)?: A Lot Help needed standing up from a chair using your arms (e.g., wheelchair or bedside chair)?: A Lot Help needed to walk in hospital room?: A Lot Help needed climbing 3-5 steps with a railing? : Total 6 Click Score: 11    End of Session   Activity Tolerance: Patient limited by lethargy Patient left: in chair;with chair alarm set;with call bell/phone within reach;with family/visitor present Nurse Communication: Mobility status;Precautions PT Visit Diagnosis: Unsteadiness on feet (R26.81);Other abnormalities of gait and mobility (R26.89);Repeated falls (R29.6);Muscle weakness (generalized) (M62.81);Ataxic gait (R26.0);Other symptoms and signs involving the nervous system (R29.898);Difficulty in walking, not elsewhere classified (R26.2)     Time: 0156-1537 PT Time Calculation (min) (ACUTE ONLY): 16 min  Charges:  $Therapeutic Activity: 8-22 mins                     Governor Rooks, PTA Acute Rehabilitation Services Pager [phone] Office [phone]     Goldie Tregoning Eli Hose 08/06/2019, 5:14 PM

## 2019-08-06 NOTE — Progress Notes (Signed)
STROKE TEAM PROGRESS NOTE      INTERVAL HISTORY Patient had TEE this morning which showed no evidence of cardiac source of embolism.  She has been evaluated by inpatient rehab team and felt not to be a good candidate and rehabilitation and skilled nursing facility has been recommended.  Blood pressure is slightly elevated but otherwise she has no neurological changes    OBJECTIVE Vitals:   08/06/19 1105 08/06/19 1110 08/06/19 1124 08/06/19 1154  BP: (!) 223/103 (!) 178/73 (!) 167/79 (!) 181/78  Pulse: 69  61 61  Resp: 18 (!) 22 (!) 25 15  Temp:  97.8 F (36.6 C)  97.8 F (36.6 C)  TempSrc:  Temporal  Oral  SpO2: 100% 98% 97% 100%  Weight:      Height:        CBC:  Recent Labs  Lab 08/05/19 0521 08/06/19 1200  WBC 8.2 8.4  NEUTROABS 4.8 5.5  HGB 12.5 12.8  HCT 39.0 39.3  MCV 86.1 86.0  PLT 283 265    Basic Metabolic Panel:  Recent Labs  Lab 08/05/19 0521 08/06/19 1200  NA 137 138  K 2.5* 3.7  CL 99 102  CO2 28 27  GLUCOSE 184* 128*  BUN 18 16  CREATININE 0.72 0.67  CALCIUM 8.7* 8.6*  MG 1.7  --     Lipid Panel:     Component Value Date/Time   CHOL 306 (H) 08/03/2019 0533   TRIG 140 08/03/2019 0533   HDL 55 08/03/2019 0533   CHOLHDL 5.6 08/03/2019 0533   VLDL 28 08/03/2019 0533   LDLCALC 223 (H) 08/03/2019 0533   HgbA1c:  Lab Results  Component Value Date   HGBA1C 14.2 (H) 08/03/2019   Urine Drug Screen:     Component Value Date/Time   LABOPIA NONE DETECTED 08/02/2019 0529   COCAINSCRNUR NONE DETECTED 08/02/2019 0529   LABBENZ NONE DETECTED 08/02/2019 0529   AMPHETMU NONE DETECTED 08/02/2019 0529   THCU NONE DETECTED 08/02/2019 0529   LABBARB NONE DETECTED 08/02/2019 0529    Alcohol Level     Component Value Date/Time   ETH <10 08/02/2019 0255    IMAGING  Dg Chest 1 View 08/02/2019 IMPRESSION:  No acute cardiopulmonary process.   Dg Lumbar Spine Complete 08/02/2019 IMPRESSION:  No acute fracture or malalignment.   Ct Head Wo  Contrast 08/02/2019 IMPRESSION:  1. Recent appearing left superior cerebellar artery infarct.  2. No intracranial hemorrhage or cervical spine fracture.   Ct Cervical Spine Wo Contrast 08/02/2019 IMPRESSION:  1. Recent appearing left superior cerebellar artery infarct.  2. No intracranial hemorrhage or cervical spine fracture.    Mr Brain 58Wo Contrast Mr Angio Head Wo Contrast 08/02/2019 IMPRESSION:  1. Moderately large acute left SCA territory infarct.  2. Chronic small vessel ischemic disease with chronic lacunar infarcts as above.  3. Advanced intracranial atherosclerosis with severe anterior and posterior circulation stenoses as above.   Vas [place] Carotid 08/03/2019 Summary:  Right Carotid: The extracranial vessels were near-normal with only minimal wall thickening or plaque.  Left Carotid: The extracranial vessels were near-normal with only minimal wall thickening or plaque.  Vertebrals:  Bilateral vertebral arteries demonstrate antegrade flow.  Subclavians: Normal flow hemodynamics were seen in bilateral subclavian arteries.  Final    Transthoracic Echocardiogram  08/02/19 IMPRESSIONS  1. Left ventricular ejection fraction, by visual estimation, is 60 to 65%. The left ventricle has normal function. Normal left ventricular size. There is moderately increased left ventricular hypertrophy.  2.  Elevated mean left atrial pressure.  3. Left ventricular diastolic Doppler parameters are consistent with impaired relaxation pattern of LV diastolic filling.  4. Global right ventricle has normal systolic function.The right ventricular size is normal. No increase in right ventricular wall thickness.  5. Left atrial size was normal.  6. Right atrial size was normal.  7. The mitral valve is normal in structure. No evidence of mitral valve regurgitation. No evidence of mitral stenosis.  8. The tricuspid valve is normal in structure. Tricuspid valve regurgitation was not visualized by color flow  Doppler.  9. The aortic valve is normal in structure. Aortic valve regurgitation was not visualized by color flow Doppler. Structurally normal aortic valve, with no evidence of sclerosis or stenosis. 10. The pulmonic valve was normal in structure. Pulmonic valve regurgitation is not visualized by color flow Doppler. 11. The inferior vena cava is normal in size with greater than 50% respiratory variability, suggesting right atrial pressure of 3 mmHg. 12. No embolic source identified.    ECG - ST rate 100 BPM. (See cardiology reading for complete details)  TEE unremarkable   PHYSICAL EXAM Blood pressure (!) 181/78, pulse 61, temperature 97.8 F (36.6 C), temperature source Oral, resp. rate 15, height 5\' 2"  (1.575 m), weight 77.1 kg, SpO2 100 %. Obese middle aged african american lady not in distress. . Afebrile. Head is nontraumatic. Neck is supple without bruit.    Cardiac exam no murmur or gallop. Lungs are clear to auscultation. Distal pulses are well felt.  Neurological Exam ;  Awake  Alert oriented x 3. Normal speech  Except mild dysathria and language.eye movements full without nystagmus.fundi were not visualized. Vision acuity and fields appear normal. Hearing is normal. Palatal movements are normal. Face symmetric. Tongue midline. Normal strength, tone, reflexes and coordination.  Truncal ataxia present normal sensation. Gait deferred.     ASSESSMENT/PLAN [name] is a 63 y.o. female with history of HLD, HTN, DM, depression and chronic low back pain presenting with acute persistent dizziness with recurrent falls for about 2 days associated with N&V resulting in inability to take her medications with subsequent hyperglycemia (515) and significantly elevated BP. 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  MRI head - Moderately large acute left SCA territory infarct. Chronic small vessel ischemic disease with chronic lacunar infarcts  MRA head - Advanced intracranial atherosclerosis with severe anterior and posterior circulation stenoses  CTA H&N - not ordered  CT Perfusion - not ordered  Carotid Doppler - unremarkable  2D Echo - EF 60 - 65%. No cardiac source of emboli identified.   Sars Corona Virus 2 - negative  LDL - 223    Component Value Date/Time   LDLCALC 223 (H) 08/03/2019 0533    HgbA1c - 14.2  UDS - negative  VTE prophylaxis - Lovenox  Diet - Heart healthy / carb modified with thin liquids.  No antithrombotic prior to admission, now on aspirin 81 mg and Plavix 75 daily into 3 weeks and then aspirin alone  Patient counseled to be compliant with her antithrombotic medications  Ongoing aggressive stroke risk factor management  Therapy recommendations: CLR  disposition: CLR Hypertension  Home BP meds: Amlodipine ; Tenormin ; Hydralazine  Current BP  meds: none  Blood pressure somewhat high at times but within post stroke/TIA parameters . Permissive hypertension (OK if < 220/120) but gradually normalize in 5-7 days . Long-term BP goal normotensive  Hyperlipidemia  Home Lipid lowering medication:  Liptor 20 mg daily  LDL 223, goal < 70  Current lipid lowering medication: consider Lipitor 80 mg daily   Continue statin at discharge  Diabetes  Home diabetic meds:  Current diabetic meds: insulin  HgbA1c 14.2, goal < 7.0 Recent Labs    08/05/19 2128 08/06/19 0645 08/06/19 1201  GLUCAP 298* 239* 126*    Other Stroke Risk Factors  Advanced age  Previous ETOH use  Obesity, Body mass index is 31.09 kg/m., recommend weight loss, diet and exercise as appropriate   Hx stroke/TIA by imaging  Other Active Problems  Recent N&V  AKI -  creatinine - 1.28  Leukocytosis - 15.6 (afebrile)   Hospital day # 4   Continue aspirin and plavix x 3 weeks and then aspirin alone. Telemetry monitoring.  Await loop  C discharge to skilled nursing facility after loop recorder...   D/W Dr Pola Corn.  Follow-up as outpatient stroke clinic in 6 weeks.  Stroke team will sign off.  Kindly call for questions.   Delia Heady, MD  To contact Stroke Continuity provider, please refer to [email or web address]. After hours, contact General Neurology

## 2019-08-06 NOTE — H&P (Signed)
    INTERVAL PROCEDURE H&P  History and Physical Interval Note:  08/06/2019 10:26 AM  Lauren Waller has presented today for their planned procedure. The various methods of treatment have been discussed with the patient and family. After consideration of risks, benefits and other options for treatment, the patient has consented to the procedure.  The patients' outpatient history has been reviewed, patient examined, and no change in status from most recent office note within the past 30 days. I have reviewed the patients' chart and labs and will proceed as planned. Questions were answered to the patient's satisfaction.   Pixie Casino, MD, [facility], [place] Director of the Advanced Lipid Disorders &  Cardiovascular Risk Reduction Clinic Diplomate of the American Board of Clinical Lipidology Attending Cardiologist  Direct Dial: [phone]  Fax: [phone]  Website:  [email or web address] 08/06/2019, 10:26 AM

## 2019-08-06 NOTE — Progress Notes (Signed)
Echocardiogram Echocardiogram Transesophageal has been performed.  Oneal Deputy Arthelia Callicott 08/06/2019, 11:17 AM

## 2019-08-06 NOTE — CV Procedure (Signed)
TRANSESOPHAGEAL ECHOCARDIOGRAM (TEE) NOTE  INDICATIONS: cryptogenic stroke  PROCEDURE:   Informed consent was obtained prior to the procedure. The risks, benefits and alternatives for the procedure were discussed and the patient comprehended these risks.  Risks include, but are not limited to, cough, sore throat, vomiting, nausea, somnolence, esophageal and stomach trauma or perforation, bleeding, low blood pressure, aspiration, pneumonia, infection, trauma to the teeth and death.    After a procedural time-out, the patient was given 2 mg versed and 25 mcg fentanyl for moderate sedation.  The patient's heart rate, blood pressure, and oxygen saturation are monitored continuously during the procedure.The oropharynx was anesthetized 10 cc of topical 1% viscous lidocaine and 2 cetacaine sprays.  The transesophageal probe was inserted in the esophagus and stomach without difficulty and multiple views were obtained.  The patient was kept under observation until the patient left the procedure room.  The period of conscious sedation is 13 minutes, of which I was present face-to-face 100% of this time. The patient left the procedure room in stable condition.   Agitated microbubble saline contrast was administered.  COMPLICATIONS:    There were no immediate complications.  Findings:  1. LEFT VENTRICLE: The left ventricular wall thickness is severely increased.  The left ventricular cavity is small in size. Wall motion is hyperdynamic.  LVEF is 60-65%.  2. RIGHT VENTRICLE:  The right ventricle is normal in structure and function without any thrombus or masses.  3. LEFT ATRIUM:  The left atrium is mildly dilated in size without any thrombus or masses.  There is not spontaneous echo contrast ("smoke") in the left atrium consistent with a low flow state.  4. LEFT ATRIAL APPENDAGE:  The left atrial appendage is free of any thrombus or masses. The appendage has single lobes. Pulse doppler indicates  moderate flow in the appendage.  5. ATRIAL SEPTUM:  The atrial septum appears intact and is free of thrombus and/or masses.  There is no evidence for interatrial shunting by color doppler and saline microbubble.  6. RIGHT ATRIUM:  The right atrium is normal in size and function without any thrombus or masses. There is a prominent eustachian valve.  7. MITRAL VALVE:  The mitral valve is normal in structure and function with trivial regurgitation.  There were no vegetations or stenosis.  8. AORTIC VALVE:  The aortic valve is trileaflet, normal in structure and function with no regurgitation.  There were no vegetations or stenosis  9. TRICUSPID VALVE:  The tricuspid valve is normal in structure and function with trivial regurgitation.  There were no vegetations or stenosis  10.  PULMONIC VALVE:  The pulmonic valve is normal in structure and function with no regurgitation.  There were no vegetations or stenosis.   11. AORTIC ARCH, ASCENDING AND DESCENDING AORTA:  There was no Ron Parker et. Al, 1992) atherosclerosis of the ascending aorta, aortic arch, or proximal descending aorta.  12. PULMONARY VEINS: Anomalous pulmonary venous return was not noted.  13. PERICARDIUM: The pericardium appeared normal and non-thickened.  There is no pericardial effusion.  IMPRESSION:   1. No LAA thrombus 2. Negative for PFO 3. Severe LVH 4. Mild LAE 5. LVEF 60-65%  RECOMMENDATIONS:    1.  No cardiac cause of emboli identified.   Time Spent Directly with the Patient:  45 minutes   Pixie Casino, MD, [facility], [place] Director of the Advanced Lipid Disorders &  Cardiovascular Risk Reduction Clinic Diplomate of  the [employer] of Clinical Lipidology Attending Cardiologist  Direct Dial: [phone]   Fax: [phone]  Website:  [email or web address] Arly Salminen 08/06/2019, 11:08 AM

## 2019-08-06 NOTE — Progress Notes (Addendum)
PROGRESS NOTE  Lauren Waller  DOB: Sep 02, 1956  PCP: Benito Mccreedy, MD XKG:[id number]  DOA: 08/02/2019  LOS: 4 days   Brief narrative: Lauren Waller is a 63 y.o. female with PMH of HTN, DM2, HLD, low back pain.  Depression who lives alone at home. Patient presented to the ED on 08/02/2019 after having been found on the floor by her bed.   Apparently, for last several days, patient was having nausea, vomiting and persistent dizziness with recurrent falls.  She had a fall and could not get up by herself.  EMS was called.  Blood sugar was noted to be elevated to 515 and blood pressure was more than 702 systolic.  She is brought to the ED.   In the ED, patient was afebrile.  Blood pressure was elevated at 233/128. Work-up showed sodium 141, potassium 3.6, glucose 570, BUN/creatinine 29/1.28, WBC count 15.6, hemoglobin 14.9, platelet 408. Urinalysis showed hazy straw-colored urine protein, rare bacteria CT scan of head showed recent appearing left superior cerebellar artery infarct.  Patient was admitted under hospitalist service for acute cerebellar stroke. Neurology consultation was called. MRI of the brain, MRA head and neck showed: 1. Moderately large acute left SCA territory infarct. 2. Chronic small vessel ischemic disease with chronic lacunar infarcts. 3. Advanced intracranial atherosclerosis with severe anterior and posterior circulation stenoses. Echocardiogram showed EF 60 to 65% LVH and impaired relaxation. Carotid duplex did not show any significant stenosis.  Subjective: Patient was seen and examined this morning.  Underwent TEE this morning.  Not in distress.  No new symptoms.  Assessment/Plan: Acute left superior cerebellar artery infarct Advanced intracranial atherosclerosis with severe anterior and posterior circulation stenosis -Presented with acute persistent dizziness, nausea, vomiting and recurrent falls for 2 days -CT scan of head, MRI brain, MRA  head and neck and carotid duplex findings as above. -No cardiac source of embolism found on echocardiogram -Neurology consultation obtained.  Started on aspirin 81 mg daily, Plavix 75 mg daily.  Continue aspirin and Plavix for 3 weeks and then aspirin alone.  Lipitor dose increased to 80 mg daily. -TEE obtained, 10/12.  No evidence of LAA thrombus, PFO.  Hypertensive urgency -Blood pressure on arrival elevated to 233/128. -Chronically uncontrolled hypertension, likely cause of lacunar infarcts and microvascular changes in the brain. -Home meds include amlodipine 10 mg daily, atenolol 100 mg daily and hydralazine twice daily. -Permissive hypertension was allowed for first 24 hours.  Resumed all of her blood pressure medicines.  Also ordered for hydralazine IV PRN. -Blood pressure surge up to 200s noted today as well, likely because her morning meds were later today because of TEE.  I also added HCTZ 12.5 mg daily with 20 mEq of potassium daily monitor blood pressure.  Acute kidney injury -Creatinine normal at baseline.  Presented with creatinine elevated to 1.28.  -Repeat creatinine shows an improvement to normal.  Hypokalemia -Potassium 2.5 yesterday, replaced aggressively.  Level improved to 3.7 today.  Leukocytosis -WBC elevated to 15.6 on admission, no evidence of infection. -Repeat CBC shows an improvement to normal.  Diabetes mellitus type 2 - A1c 14.2 -I do not see any insulin or other diabetes medications on her home list. -Currently patient is on Lantus 15 units daily and sliding scale insulin with Accu-Cheks. -Diabetes coordinator consulted.  Increased Lantus dose to 20 units daily. -Has also been started on glipizide 2.5 mg daily since yesterday.  Hyperlipidemia -LDL 223, goal less than 70 -Home meds include Lipitor 20 mg daily.  Dose increased  to 80 mg daily.  Mobility: Encourage ambulation. PT eval ordered.  DVT prophylaxis: Lovenox subcu Code Status:Code Status:  Full Code  Family Communication: Expected Discharge: CIR recommended by PT.  Rehab consult placed.  Consultants:  Neurology  Procedures:    Antimicrobials: Anti-infectives (From admission, onward)   None      Diet Order            Diet heart healthy/carb modified Room service appropriate? Yes; Fluid consistency: Thin  Diet effective now              Infusions:    Scheduled Meds: . amLODipine  10 mg Oral Daily  . aspirin EC  81 mg Oral Daily  . atenolol  100 mg Oral Daily  . atorvastatin  40 mg Oral Daily  . clopidogrel  75 mg Oral Daily  . enoxaparin (LOVENOX) injection  40 mg Subcutaneous Q24H  . glipiZIDE  2.5 mg Oral Q breakfast  . hydrALAZINE  100 mg Oral BID  . hydrochlorothiazide  12.5 mg Oral Daily  . insulin aspart  0-15 Units Subcutaneous TID WC  . insulin aspart  0-5 Units Subcutaneous QHS  . insulin glargine  20 Units Subcutaneous Daily  . [START ON 08/07/2019] potassium chloride  20 mEq Oral Daily    PRN meds: acetaminophen **OR** acetaminophen (TYLENOL) oral liquid 160 mg/5 mL **OR** acetaminophen, hydrALAZINE, senna-docusate   Objective: Vitals:   08/06/19 1124 08/06/19 1154  BP: (!) 167/79 (!) 181/78  Pulse: 61 61  Resp: (!) 25 15  Temp:  97.8 F (36.6 C)  SpO2: 97% 100%    Intake/Output Summary (Last 24 hours) at 08/06/2019 1358 Last data filed at 08/06/2019 0333 Gross per 24 hour  Intake 287.84 ml  Output 800 ml  Net -512.16 ml   Filed Weights   08/02/19 1606  Weight: 77.1 kg   Weight change:  Body mass index is 31.09 kg/m.   Physical Exam: General exam: Appears calm and comfortable.  Skin: No rashes, lesions or ulcers. HEENT: Atraumatic, normocephalic, supple neck, no obvious bleeding Lungs: Clear to auscultation bilaterally CVS: Regular rate and rhythm, no murmur GI/Abd soft, nontender, nondistended, bowel sound present CNS: Alert, awake, oriented x3, Psychiatry: Mood appropriate Extremities: No pedal edema,  no calf tenderness  Data Review: I have personally reviewed the laboratory data and studies available.  Recent Labs  Lab 08/02/19 0240 08/02/19 0558 08/05/19 0521 08/06/19 1200  WBC 15.6*  --  8.2 8.4  NEUTROABS 13.4*  --  4.8 5.5  HGB 14.9 14.6 12.5 12.8  HCT 47.8* 43.0 39.0 39.3  MCV 88.7  --  86.1 86.0  PLT 408*  --  283 265   Recent Labs  Lab 08/02/19 0240 08/02/19 0558 08/05/19 0521 08/06/19 1200  NA 141 144 137 138  K 3.6 3.5 2.5* 3.7  CL 96*  --  99 102  CO2 24  --  28 27  GLUCOSE 570*  --  184* 128*  BUN 29*  --  18 16  CREATININE 1.28*  --  0.72 0.67  CALCIUM 10.0  --  8.7* 8.6*  MG  --   --  1.7  --      Lorin Glass, MD  [facility] 08/06/2019

## 2019-08-07 LAB — CBC WITH DIFFERENTIAL/PLATELET
Abs Immature Granulocytes: 0.03 10*3/uL (ref 0.00–0.07)
Basophils Absolute: 0 10*3/uL (ref 0.0–0.1)
Basophils Relative: 0 %
Eosinophils Absolute: 0.1 10*3/uL (ref 0.0–0.5)
Eosinophils Relative: 1 %
HCT: 35.5 % — ABNORMAL LOW (ref 36.0–46.0)
Hemoglobin: 11.6 g/dL — ABNORMAL LOW (ref 12.0–15.0)
Immature Granulocytes: 0 %
Lymphocytes Relative: 22 %
Lymphs Abs: 2.2 10*3/uL (ref 0.7–4.0)
MCH: 28.1 pg (ref 26.0–34.0)
MCHC: 32.7 g/dL (ref 30.0–36.0)
MCV: 86 fL (ref 80.0–100.0)
Monocytes Absolute: 0.8 10*3/uL (ref 0.1–1.0)
Monocytes Relative: 8 %
Neutro Abs: 6.8 10*3/uL (ref 1.7–7.7)
Neutrophils Relative %: 69 %
Platelets: 268 10*3/uL (ref 150–400)
RBC: 4.13 MIL/uL (ref 3.87–5.11)
RDW: 12.5 % (ref 11.5–15.5)
WBC: 9.9 10*3/uL (ref 4.0–10.5)
nRBC: 0 % (ref 0.0–0.2)

## 2019-08-07 LAB — BASIC METABOLIC PANEL
Anion gap: 10 (ref 5–15)
BUN: 14 mg/dL (ref 8–23)
CO2: 26 mmol/L (ref 22–32)
Calcium: 9.1 mg/dL (ref 8.9–10.3)
Chloride: 102 mmol/L (ref 98–111)
Creatinine, Ser: 0.72 mg/dL (ref 0.44–1.00)
GFR calc Af Amer: >60 mL/min (ref >60)
GFR calc non Af Amer: >60 mL/min (ref >60)
Glucose, Bld: 97 mg/dL (ref 70–99)
Potassium: 3 mmol/L — ABNORMAL LOW (ref 3.5–5.1)
Sodium: 138 mmol/L (ref 135–145)

## 2019-08-07 LAB — GLUCOSE, CAPILLARY
Glucose-Capillary: 102 mg/dL — ABNORMAL HIGH (ref 70–99)
Glucose-Capillary: 282 mg/dL — ABNORMAL HIGH (ref 70–99)
Glucose-Capillary: 117 mg/dL — ABNORMAL HIGH (ref 70–99)
Glucose-Capillary: 126 mg/dL — ABNORMAL HIGH (ref 70–99)

## 2019-08-07 MED ORDER — AMLODIPINE BESYLATE 10 MG PO TABS
10.0000 mg | ORAL_TABLET | Freq: Every day | ORAL | Status: DC
  Administered 2019-08-07 – 2019-08-09 (×3): 10 mg via ORAL
  Filled 2019-08-07 (×3): qty 1

## 2019-08-07 MED ORDER — HYDRALAZINE HCL 50 MG PO TABS
100.0000 mg | ORAL_TABLET | Freq: Three times a day (TID) | ORAL | Status: DC
  Administered 2019-08-07 – 2019-08-10 (×10): 100 mg via ORAL
  Filled 2019-08-07 (×10): qty 2

## 2019-08-07 MED ORDER — CLONIDINE HCL 0.1 MG PO TABS
0.1000 mg | ORAL_TABLET | Freq: Two times a day (BID) | ORAL | Status: DC
  Administered 2019-08-07 – 2019-08-10 (×6): 0.1 mg via ORAL
  Filled 2019-08-07 (×6): qty 1

## 2019-08-07 NOTE — Progress Notes (Signed)
Physical Therapy Treatment Patient Details Name: Lauren Waller MRN: [id number] DOB: 1956/06/29 Today's Date: 08/07/2019    History of Present Illness This 63 y.o. female admitted after being found on the floor by her bed.  CT of head showed LT superior cerebellar artery infarct.  MRI showed moderately large acute Lt SCA terrirtory infarct, chronic small vessel ischemic disease with chronic lacunar infarcts, and advanced intracranial atherosclerosis with severe anterior and posterior circulation stenosis.  PMH Includes: DM, HTN, depression, chronic LBP    PT Comments    Pt sleepy, but willing to participate in therapy. Requiring min assist for transfers. Ambulating 2 feet with walker and requiring up to maximal assist due to left lateral loss of balance. Pt has continued lower extremity ataxia, decreased safety awareness, and poor balance. Continue to recommend comprehensive inpatient rehab (CIR) for post-acute therapy needs.    Follow Up Recommendations  CIR;Supervision/Assistance - 24 hour     Equipment Recommendations  None recommended by PT    Recommendations for Other Services       Precautions / Restrictions Precautions Precautions: Fall Precaution Comments: ataxic Restrictions Weight Bearing Restrictions: No    Mobility  Bed Mobility Overal bed mobility: Needs Assistance Bed Mobility: Supine to Sit     Supine to sit: Min guard     General bed mobility comments: Min guard to sit up after x 2 attempts. Use of bed rail and increased effort to rise  Transfers Overall transfer level: Needs assistance Equipment used: Rolling walker (2 wheeled) Transfers: Sit to/from Stand Sit to Stand: Min assist;+2 safety/equipment         General transfer comment: MinA to stand x 2. Cues for hand placement  Ambulation/Gait Ambulation/Gait assistance: Min assist;+2 safety/equipment;+2 physical assistance;Max assist Gait Distance (Feet): 2 Feet Assistive device: Rolling  walker (2 wheeled) Gait Pattern/deviations: Step-to pattern;Decreased stance time - left;Shuffle;Trunk flexed;Ataxic;Decreased step length - left;Staggering left   Gait velocity interpretation: <1.31 ft/sec, indicative of household ambulator General Gait Details: Pt walking 2 feet, then heavy stagger and lateral LOB towards left, requiring maxA by therapist to correct. Chair brought up behind pt   Stairs             Wheelchair Mobility    Modified Rankin (Stroke Patients Only) Modified Rankin (Stroke Patients Only) Pre-Morbid Rankin Score: Moderate disability Modified Rankin: Moderately severe disability     Balance Overall balance assessment: Needs assistance Sitting-balance support: Feet supported Sitting balance-Leahy Scale: Fair     Standing balance support: Bilateral upper extremity supported;During functional activity Standing balance-Leahy Scale: Poor                              Cognition Arousal/Alertness: Lethargic Behavior During Therapy: Flat affect;Impulsive Overall Cognitive Status: No family/caregiver present to determine baseline cognitive functioning Area of Impairment: Attention;Awareness;Problem solving;Following commands;Safety/judgement                   Current Attention Level: Sustained   Following Commands: Follows one step commands inconsistently;Follows one step commands with increased time Safety/Judgement: Decreased awareness of safety;Decreased awareness of deficits Awareness: Intellectual Problem Solving: Slow processing;Difficulty sequencing;Requires verbal cues;Requires tactile cues General Comments: Very impulsive with poor safety awareness. Sleepy throughout session      Exercises      General Comments        Pertinent Vitals/Pain Pain Assessment: Faces Faces Pain Scale: No hurt    Home Living  Prior Function            PT Goals (current goals can now be found in the  care plan section) Acute Rehab PT Goals Patient Stated Goal: "get better." Potential to Achieve Goals: Fair Progress towards PT goals: Progressing toward goals    Frequency    Min 4X/week      PT Plan Current plan remains appropriate    Co-evaluation              AM-PAC PT "6 Clicks" Mobility   Outcome Measure  Help needed turning from your back to your side while in a flat bed without using bedrails?: A Little Help needed moving from lying on your back to sitting on the side of a flat bed without using bedrails?: A Little Help needed moving to and from a bed to a chair (including a wheelchair)?: A Little Help needed standing up from a chair using your arms (e.g., wheelchair or bedside chair)?: A Little Help needed to walk in hospital room?: A Lot Help needed climbing 3-5 steps with a railing? : Total 6 Click Score: 15    End of Session Equipment Utilized During Treatment: Gait belt Activity Tolerance: Patient limited by fatigue Patient left: in chair;with chair alarm set;with call bell/phone within reach;with family/visitor present Nurse Communication: Mobility status PT Visit Diagnosis: Unsteadiness on feet (R26.81);Other abnormalities of gait and mobility (R26.89);Repeated falls (R29.6);Muscle weakness (generalized) (M62.81);Ataxic gait (R26.0);Other symptoms and signs involving the nervous system (R29.898);Difficulty in walking, not elsewhere classified (R26.2)     Time: 6045-4098 PT Time Calculation (min) (ACUTE ONLY): 23 min  Charges:  $Therapeutic Activity: 23-37 mins                     Lauren Waller, [place], DPT Acute Rehabilitation Services Pager [phone] Office [phone]    Lauren Waller 08/07/2019, 5:02 PM

## 2019-08-07 NOTE — Progress Notes (Signed)
SLP Cancellation Note  Patient Details Name: Lauren Waller MRN: [id number] DOB: May 30, 1956   Cancelled treatment:       Reason Eval/Treat Not Completed: Other (comment) Pt just starting to work with PT. Will f/u as able.   Venita Sheffield Desarai Barrack 08/07/2019, 3:38 PM  Pollyann Glen, M.A. [place] Acute Environmental education officer [phone] Office [phone]

## 2019-08-07 NOTE — Progress Notes (Signed)
PROGRESS NOTE  Lauren Waller  DOB: 1955-11-25  PCP: Jackie Plum, MD JEH:[id number]  DOA: 08/02/2019  LOS: 5 days   Brief narrative: Lauren Waller is a 63 y.o. female with PMH of HTN, DM2, HLD, low back pain.  Depression who lives alone at home. Patient presented to the ED on 08/02/2019 after having been found on the floor by her bed.   Apparently, for last several days, patient was having nausea, vomiting and persistent dizziness with recurrent falls.  She had a fall and could not get up by herself.  EMS was called.  Blood sugar was noted to be elevated to 515 and blood pressure was more than 200 systolic.  She is brought to the ED.   In the ED, patient was afebrile.  Blood pressure was elevated at 233/128. Work-up showed sodium 141, potassium 3.6, glucose 570, BUN/creatinine 29/1.28, WBC count 15.6, hemoglobin 14.9, platelet 408. Urinalysis showed hazy straw-colored urine protein, rare bacteria CT scan of head showed recent appearing left superior cerebellar artery infarct.  Patient was admitted under hospitalist service for acute cerebellar stroke. Neurology consultation was called. MRI of the brain, MRA head and neck showed: 1. Moderately large acute left SCA territory infarct. 2. Chronic small vessel ischemic disease with chronic lacunar infarcts. 3. Advanced intracranial atherosclerosis with severe anterior and posterior circulation stenoses. Echocardiogram showed EF 60 to 65% LVH and impaired relaxation. Carotid duplex did not show any significant stenosis.  Subjective: Patient was seen and examined this afternoon.  Not in distress.  No new symptoms on blood pressure remains elevated in the 180s.  Assessment/Plan: Acute left superior cerebellar artery infarct Advanced intracranial atherosclerosis with severe anterior and posterior circulation stenosis -Presented with acute persistent dizziness, nausea, vomiting and recurrent falls for 2 days -CT scan of  head, MRI brain, MRA head and neck and carotid duplex findings as above. -No cardiac source of embolism found on echocardiogram -Neurology consultation obtained.  Started on aspirin 81 mg daily, Plavix 75 mg daily.  Continue aspirin and Plavix for 3 weeks and then aspirin alone.  Lipitor dose increased to 80 mg daily. -TEE obtained, 10/12.  No evidence of LAA thrombus, PFO.  Hypertensive urgency -Blood pressure on arrival elevated to 233/128. -Chronically uncontrolled hypertension, likely cause of lacunar infarcts and microvascular changes in the brain. -Home meds include amlodipine 10 mg daily, atenolol 100 mg daily and hydralazine 100 mg twice daily. -Her blood pressure has been hard to control.  We have been titrating her medications. -Currently on amlodipine 10, atenolol 100, hydralazine 100 mg 3 times daily and clonidine 0.1 mg twice daily. -Continue to monitor blood pressure.  Acute kidney injury -Creatinine normal at baseline.  Presented with creatinine elevated to 1.28.  -Repeat creatinine shows an improvement to normal.  Hypokalemia -Potassium 3 today.  Received potassium 20 mEq today.  Repeat level tomorrow.  Leukocytosis -WBC elevated to 15.6 on admission, no evidence of infection. -Repeat CBC shows an improvement to normal.  Diabetes mellitus type 2 - A1c 14.2 -I do not see any insulin or other diabetes medications on her home list. -Currently patient is on Lantus 20 units daily and sliding scale insulin with Accu-Cheks. -Diabetes coordinator consult appreciated. -Has also been started on glipizide 2.5 mg daily.  Hyperlipidemia -LDL 223, goal less than 70 -Home meds include Lipitor 20 mg daily.  Dose increased to 80 mg daily.  Mobility: Encourage ambulation. PT eval obtained.  CIR denied.  SNF pending. DVT prophylaxis: Lovenox subcu Code Status:Code Status: Full  Code  Family Communication: Expected Discharge: Pending SNF placement.  Consultants:   Neurology  Procedures:    Antimicrobials: Anti-infectives (From admission, onward)   None      Diet Order            Diet heart healthy/carb modified Room service appropriate? Yes; Fluid consistency: Thin  Diet effective now              Infusions:    Scheduled Meds: . amLODipine  10 mg Oral QPC supper  . aspirin EC  81 mg Oral Daily  . atenolol  100 mg Oral Daily  . atorvastatin  40 mg Oral Daily  . cloNIDine  0.1 mg Oral BID  . clopidogrel  75 mg Oral Daily  . enoxaparin (LOVENOX) injection  40 mg Subcutaneous Q24H  . glipiZIDE  2.5 mg Oral Q breakfast  . hydrALAZINE  100 mg Oral Q8H  . insulin aspart  0-15 Units Subcutaneous TID WC  . insulin aspart  0-5 Units Subcutaneous QHS  . insulin glargine  20 Units Subcutaneous Daily  . potassium chloride  20 mEq Oral Daily    PRN meds: acetaminophen **OR** acetaminophen (TYLENOL) oral liquid 160 mg/5 mL **OR** acetaminophen, hydrALAZINE, senna-docusate   Objective: Vitals:   08/07/19 0822 08/07/19 1126  BP: (!) 178/76 (!) 185/81  Pulse: 65 64  Resp: 17 16  Temp: (!) 97.3 F (36.3 C) (!) 97.4 F (36.3 C)  SpO2: 100% 100%    Intake/Output Summary (Last 24 hours) at 08/07/2019 1524 Last data filed at 08/06/2019 2300 Gross per 24 hour  Intake 240 ml  Output 475 ml  Net -235 ml   Filed Weights   08/02/19 1606  Weight: 77.1 kg   Weight change:  Body mass index is 31.09 kg/m.   Physical Exam: General exam: Appears calm and comfortable.  Skin: No rashes, lesions or ulcers. HEENT: Atraumatic, normocephalic, supple neck, no obvious bleeding Lungs: Clear to auscultation bilaterally CVS: Regular rate and rhythm, no murmur GI/Abd soft, nontender, nondistended, bowel sound present CNS: Alert, awake, oriented x3, slow to respond. Psychiatry: Mood appropriate Extremities: No pedal edema, no calf tenderness  Data Review: I have personally reviewed the laboratory data and studies available.  Recent Labs   Lab 08/02/19 0240 08/02/19 0558 08/05/19 0521 08/06/19 1200 08/07/19 0437  WBC 15.6*  --  8.2 8.4 9.9  NEUTROABS 13.4*  --  4.8 5.5 6.8  HGB 14.9 14.6 12.5 12.8 11.6*  HCT 47.8* 43.0 39.0 39.3 35.5*  MCV 88.7  --  86.1 86.0 86.0  PLT 408*  --  283 265 268   Recent Labs  Lab 08/02/19 0240 08/02/19 0558 08/05/19 0521 08/06/19 1200 08/07/19 0437  NA 141 144 137 138 138  K 3.6 3.5 2.5* 3.7 3.0*  CL 96*  --  99 102 102  CO2 24  --  28 27 26   GLUCOSE 570*  --  184* 128* 97  BUN 29*  --  18 16 14   CREATININE 1.28*  --  0.72 0.67 0.72  CALCIUM 10.0  --  8.7* 8.6* 9.1  MG  --   --  1.7  --   --      Terrilee Croak, MD  [facility] 08/07/2019

## 2019-08-08 LAB — GLUCOSE, CAPILLARY
Glucose-Capillary: 105 mg/dL — ABNORMAL HIGH (ref 70–99)
Glucose-Capillary: 220 mg/dL — ABNORMAL HIGH (ref 70–99)
Glucose-Capillary: 174 mg/dL — ABNORMAL HIGH (ref 70–99)
Glucose-Capillary: 112 mg/dL — ABNORMAL HIGH (ref 70–99)

## 2019-08-08 LAB — BASIC METABOLIC PANEL
Anion gap: 9 (ref 5–15)
BUN: 11 mg/dL (ref 8–23)
CO2: 25 mmol/L (ref 22–32)
Calcium: 8.8 mg/dL — ABNORMAL LOW (ref 8.9–10.3)
Chloride: 101 mmol/L (ref 98–111)
Creatinine, Ser: 0.81 mg/dL (ref 0.44–1.00)
GFR calc Af Amer: >60 mL/min (ref >60)
GFR calc non Af Amer: >60 mL/min (ref >60)
Glucose, Bld: 209 mg/dL — ABNORMAL HIGH (ref 70–99)
Potassium: 3.4 mmol/L — ABNORMAL LOW (ref 3.5–5.1)
Sodium: 135 mmol/L (ref 135–145)

## 2019-08-08 NOTE — TOC Initial Note (Addendum)
Transition of Care [facility]) - Initial/Assessment Note    Patient Details  Name: Lauren Waller MRN: [id number] Date of Birth: September 06, 1956  [facility] of [facility]) CM/SW Contact:    Pollie Friar, RN Phone Number: 08/08/2019, 2:43 PM  Clinical Narrative:                 CM met with the patient and she defers to her niece to make the decisions regarding rehab. CM spoke to [place] (niece) and she is in agreement with SNF rehab. She asked that the patient be faxed out in the [facility] area.  CM called her with the bed offers and she is going to research these and let CM know of her choice.  CM has submitted for insurance authorization under her [place] under [place].  TOC following.  1600 Addendum: CM sent the niece information on the SNF offers. She is going to go over them and let CM know her decision. [place] needs to know what she selects when decision is made.  Expected Discharge Plan: Skilled Nursing Facility Barriers to Discharge: Continued Medical Work up, Ship broker   Patient Goals and CMS Choice   CMS Medicare.gov Compare Post Acute Care list provided to:: Patient Represenative (must comment) Choice offered to / list presented to : (niece)  Expected Discharge Plan and Services Expected Discharge Plan: Skilled Nursing Facility In-house Referral: Clinical Social Work Discharge Planning Services: CM Consult Post Acute Care Choice: [facility]                                        Prior Living Arrangements/Services   Lives with:: Self Patient language and need for interpreter reviewed:: Yes(no needs) Do you feel safe going back to the place where you live?: No      Need for Family Participation in Patient Care: Yes (Comment) Care giver support system in place?: No (comment)   Criminal Activity/Legal Involvement Pertinent to Current Situation/Hospitalization: No - Comment as needed  Activities of Daily  Living Home Assistive Devices/Equipment: Cane (specify quad or straight) ADL Screening (condition at time of admission) Patient's cognitive ability adequate to safely complete daily activities?: Yes Is the patient deaf or have difficulty hearing?: No Does the patient have difficulty seeing, even when wearing glasses/contacts?: No Does the patient have difficulty concentrating, remembering, or making decisions?: No Patient able to express need for assistance with ADLs?: Yes Does the patient have difficulty dressing or bathing?: No Independently performs ADLs?: Yes (appropriate for developmental age) Does the patient have difficulty walking or climbing stairs?: Yes Weakness of Legs: Both Weakness of Arms/Hands: None  Permission Sought/Granted                  Emotional Assessment Appearance:: Appears stated age Attitude/Demeanor/Rapport: Engaged Affect (typically observed): Accepting Orientation: : Oriented to Self, Oriented to Situation, Oriented to Place, Oriented to  Time   Psych Involvement: No (comment)  Admission diagnosis:  Pain [R52] Hyperglycemia [R73.9] Elevated troponin [R77.8] Hypertensive urgency [I16.0] Cerebrovascular accident (CVA), unspecified mechanism ([place]) [I63.9] Patient Active Problem List   Diagnosis Date Noted  . 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 HLD (hyperlipidemia) 04/17/2018  . Hypertensive urgency 04/17/2018  . Facial cellulitis 04/17/2018  . Sepsis ([place]) 04/17/2018  . Diabetes mellitus without complication ([place])   . Hypertension   . Varicose veins of bilateral lower extremities  with other complications 30/04/6225   PCP:  Benito Mccreedy, MD Pharmacy:   [facility], [place] [address] Phone: [phone] Fax: [phone]     Social Determinants of Health (SDOH) Interventions    Readmission Risk Interventions No flowsheet data  found.

## 2019-08-08 NOTE — Progress Notes (Signed)
  Speech Language Pathology Treatment: Cognitive-Linquistic  Patient Details Name: Lauren Waller MRN: [id number] DOB: 1956/06/06 Today's Date: 08/08/2019 Time: 1601-0932 SLP Time Calculation (min) (ACUTE ONLY): 19 min  Assessment / Plan / Recommendation Clinical Impression  Skilled treatment session focused on cognition. SLP recieved pt upright in her recliner. Lunch tray present and pt was self-feeding with fingers. Pt perseverative on saying "I am trying to eat my carrots, trying to eat my carrots." Pt was impulsive and required Mod A cues to self-feed using fork and slow rate. She required cues to effectively clear each bolus before impulsively putting another bite into mouth. Nursing made aware that pt would benefit from increased supervision during meals to help with impulsivity. Nursing agreeable.     HPI HPI: This 63 y.o. female admitted after beinf found on the floor by her bed.  CT of head showed LT superior cerebellar artery infarct.  MRI showed moderately large acute Lt SCA terrirtory infarct, chronic small vessel ischemic disease with chronic lacunar infarcts, and advanced intracranial atherosclerosis with severe anterior and posterior circulation stenosis.  PMH Includes: DM, HTN, depression, chronic LBP      SLP Plan  Continue with current plan of care       Recommendations   SNF                Follow up Recommendations: Skilled Nursing facility SLP Visit Diagnosis: Cognitive communication deficit (T55.732) Plan: Continue with current plan of care       GO                Lauren Waller 08/08/2019, 2:02 PM

## 2019-08-08 NOTE — Progress Notes (Signed)
Physical Therapy Treatment Patient Details Name: Lauren Waller MRN: [id number] DOB: September 20, 1956 Today's Date: 08/08/2019    History of Present Illness This 63 y.o. female admitted after being found on the floor by her bed.  CT of head showed LT superior cerebellar artery infarct.  MRI showed moderately large acute Lt SCA terrirtory infarct, chronic small vessel ischemic disease with chronic lacunar infarcts, and advanced intracranial atherosclerosis with severe anterior and posterior circulation stenosis.  PMH Includes: DM, HTN, depression, chronic LBP    PT Comments    Session limited due to pt fatigue. Pt having difficulty keeping eyes open, attending to tasks, and following commands. Frequently lying back down on bed. Requiring two person moderate assist and walker for stand pivot transfer to chair. Further ambulation deferred due to decreased arousal.    Follow Up Recommendations  CIR;Supervision/Assistance - 24 hour     Equipment Recommendations  None recommended by PT    Recommendations for Other Services       Precautions / Restrictions Precautions Precautions: Fall Precaution Comments: ataxic Restrictions Weight Bearing Restrictions: No    Mobility  Bed Mobility Overal bed mobility: Needs Assistance Bed Mobility: Supine to Sit     Supine to sit: Mod assist     General bed mobility comments: ModA for initiation of BLE's off edge of bed and pulling up to sit. Limited by lethargy  Transfers Overall transfer level: Needs assistance Equipment used: Rolling walker (2 wheeled) Transfers: Sit to/from [facility] Sit to Stand: +2 safety/equipment;Mod assist Stand pivot transfers: Mod assist;+2 safety/equipment       General transfer comment: ModA for standing from edge of bed and taking pivotal steps towards left. Cues for sequencing, direction  Ambulation/Gait             General Gait Details: Unable due to fatigue   Stairs              Wheelchair Mobility    Modified Rankin (Stroke Patients Only) Modified Rankin (Stroke Patients Only) Pre-Morbid Rankin Score: Moderate disability Modified Rankin: Moderately severe disability     Balance Overall balance assessment: Needs assistance Sitting-balance support: Feet supported Sitting balance-Leahy Scale: Fair     Standing balance support: Bilateral upper extremity supported;During functional activity Standing balance-Leahy Scale: Poor                              Cognition Arousal/Alertness: Lethargic Behavior During Therapy: Flat affect;Impulsive Overall Cognitive Status: No family/caregiver present to determine baseline cognitive functioning Area of Impairment: Attention;Awareness;Problem solving;Following commands;Safety/judgement                   Current Attention Level: Sustained   Following Commands: Follows one step commands inconsistently;Follows one step commands with increased time Safety/Judgement: Decreased awareness of safety;Decreased awareness of deficits Awareness: Intellectual Problem Solving: Slow processing;Difficulty sequencing;Requires verbal cues;Requires tactile cues General Comments: Pt sleepy, needs max stimulation for command following and initiation      Exercises      General Comments        Pertinent Vitals/Pain Pain Assessment: Faces Faces Pain Scale: No hurt    Home Living                      Prior Function            PT Goals (current goals can now be found in the care plan section) Acute Rehab PT Goals Patient Stated  Goal: "get better." Potential to Achieve Goals: Fair    Frequency    Min 4X/week      PT Plan Current plan remains appropriate    Co-evaluation              AM-PAC PT "6 Clicks" Mobility   Outcome Measure  Help needed turning from your back to your side while in a flat bed without using bedrails?: A Little Help needed moving from lying on  your back to sitting on the side of a flat bed without using bedrails?: A Little Help needed moving to and from a bed to a chair (including a wheelchair)?: A Little Help needed standing up from a chair using your arms (e.g., wheelchair or bedside chair)?: A Little Help needed to walk in hospital room?: A Lot Help needed climbing 3-5 steps with a railing? : Total 6 Click Score: 15    End of Session Equipment Utilized During Treatment: Gait belt Activity Tolerance: Patient limited by fatigue Patient left: in chair;with chair alarm set;with call bell/phone within reach;with family/visitor present Nurse Communication: Mobility status PT Visit Diagnosis: Unsteadiness on feet (R26.81);Other abnormalities of gait and mobility (R26.89);Repeated falls (R29.6);Muscle weakness (generalized) (M62.81);Ataxic gait (R26.0);Other symptoms and signs involving the nervous system (R29.898);Difficulty in walking, not elsewhere classified (R26.2)     Time: 9629-5284 PT Time Calculation (min) (ACUTE ONLY): 16 min  Charges:  $Therapeutic Activity: 8-22 mins                     Lauren Waller, PT, DPT Acute Rehabilitation Services Pager [phone] Office [phone]    Lauren Waller 08/08/2019, 1:55 PM

## 2019-08-08 NOTE — NC FL2 (Signed)
[place] LEVEL OF CARE SCREENING TOOL     IDENTIFICATION  Patient Name: Lauren Waller Birthdate: 12-10-1955 Sex: female Admission Date (Current Location): 08/02/2019  [facility] and [place] Number:  Herbalist and Address:  [facility] [place] [address] [place], [place] [address]      Provider Number: [id number]  Attending Physician Name and Address:  Terrilee Croak, MD  Relative Name and Phone Number:       Current Level of Care: Hospital Recommended Level of Care: [place] Prior Approval Number:  [id number]  Date Approved/Denied:   PASRR Number:    Discharge Plan: SNF    Current Diagnoses: Patient Active Problem List   Diagnosis Date Noted  . 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 HLD (hyperlipidemia) 04/17/2018  . Hypertensive urgency 04/17/2018  . Facial cellulitis 04/17/2018  . Sepsis ([place]) 04/17/2018  . Diabetes mellitus without complication ([place])   . Hypertension   . Varicose veins of bilateral lower extremities with other complications 27/12/5007    Orientation RESPIRATION BLADDER Height & Weight     Self, Time, Situation, Place  Normal Incontinent Weight: 77 kg Height:  5\' 2"  (157.5 cm)  BEHAVIORAL SYMPTOMS/MOOD NEUROLOGICAL BOWEL NUTRITION STATUS      Continent Diet(heart healthy/ carb modified)  AMBULATORY STATUS COMMUNICATION OF NEEDS Skin   Extensive Assist Verbally Normal                       Personal Care Assistance Level of Assistance  Bathing, Dressing, Feeding Bathing Assistance: Maximum assistance Feeding assistance: Limited assistance Dressing Assistance: Maximum assistance     Functional Limitations Info  Sight, Hearing, Speech Sight Info: Impaired Hearing Info: Adequate Speech Info: Adequate    SPECIAL CARE FACTORS FREQUENCY  PT (By licensed PT), OT (By licensed OT), Speech therapy     PT Frequency: 5x/wk OT  Frequency: 5x/wk     Speech Therapy Frequency: 5x/wk      Contractures Contractures Info: Not present    Additional Factors Info  Code Status, Allergies, Insulin Sliding Scale Code Status Info: Full Allergies Info: Januvia/ metformin   Insulin Sliding Scale Info: Novolog 0-15 units SQ three times a day/ Novolog 0-5 units SQ at bedtime/ Lantus 20 units SQ daily       Current Medications (08/08/2019):  This is the current hospital active medication list Current Facility-Administered Medications  Medication Dose Route Frequency Provider Last Rate Last Dose  . acetaminophen (TYLENOL) tablet 650 mg  650 mg Oral Q4H PRN Pixie Casino, MD       Or  . acetaminophen (TYLENOL) solution 650 mg  650 mg Per Tube Q4H PRN Hilty, Nadean Corwin, MD       Or  . acetaminophen (TYLENOL) suppository 650 mg  650 mg Rectal Q4H PRN Pixie Casino, MD      . amLODipine (NORVASC) tablet 10 mg  10 mg Oral QPC supper Terrilee Croak, MD   10 mg at 08/07/19 1808  . aspirin EC tablet 81 mg  81 mg Oral Daily Pixie Casino, MD   81 mg at 08/08/19 1015  . atenolol (TENORMIN) tablet 100 mg  100 mg Oral Daily Pixie Casino, MD   100 mg at 08/08/19 1015  . atorvastatin (LIPITOR) tablet 40 mg  40 mg Oral Daily Pixie Casino, MD   40 mg at 08/08/19 1015  . cloNIDine (CATAPRES) tablet 0.1 mg  0.1  mg Oral BID Lorin Glass, MD   0.1 mg at 08/08/19 1015  . clopidogrel (PLAVIX) tablet 75 mg  75 mg Oral Daily Chrystie Nose, MD   75 mg at 08/08/19 1015  . enoxaparin (LOVENOX) injection 40 mg  40 mg Subcutaneous Q24H Hilty, Lisette Abu, MD   40 mg at 08/07/19 1302  . glipiZIDE (GLUCOTROL XL) 24 hr tablet 2.5 mg  2.5 mg Oral Q breakfast Hilty, Lisette Abu, MD   2.5 mg at 08/08/19 0815  . hydrALAZINE (APRESOLINE) injection 10 mg  10 mg Intravenous Q6H PRN Chrystie Nose, MD   10 mg at 08/03/19 1202  . hydrALAZINE (APRESOLINE) tablet 100 mg  100 mg Oral Q8H Dahal, Binaya, MD   100 mg at 08/08/19 0631  . insulin aspart  (novoLOG) injection 0-15 Units  0-15 Units Subcutaneous TID WC Chrystie Nose, MD   8 Units at 08/07/19 1302  . insulin aspart (novoLOG) injection 0-5 Units  0-5 Units Subcutaneous QHS Chrystie Nose, MD   2 Units at 08/06/19 2124  . insulin glargine (LANTUS) injection 20 Units  20 Units Subcutaneous Daily Chrystie Nose, MD   20 Units at 08/08/19 1015  . senna-docusate (Senokot-S) tablet 1 tablet  1 tablet Oral QHS PRN Chrystie Nose, MD   1 tablet at 08/07/19 1310     Discharge Medications: Please see discharge summary for a list of discharge medications.  Relevant Imaging Results:  Relevant Lab Results:   Additional Information SS#: [id number]  Kermit Balo, RN

## 2019-08-08 NOTE — Progress Notes (Signed)
PROGRESS NOTE  Lauren Waller  DOB: 1955/12/14  PCP: Benito Mccreedy, MD ZOX:[id number]  DOA: 08/02/2019  LOS: 6 days   Brief narrative: Lauren Waller is a 63 y.o. female with PMH of HTN, DM2, HLD, low back pain.  Depression who lives alone at home. Patient presented to the ED on 08/02/2019 after having been found on the floor by her bed.   Apparently, for last several days, patient was having nausea, vomiting and persistent dizziness with recurrent falls.  She had a fall and could not get up by herself.  EMS was called.  Blood sugar was noted to be elevated to 515 and blood pressure was more than 811 systolic.  She is brought to the ED.   In the ED, patient was afebrile.  Blood pressure was elevated at 233/128. Work-up showed sodium 141, potassium 3.6, glucose 570, BUN/creatinine 29/1.28, WBC count 15.6, hemoglobin 14.9, platelet 408. Urinalysis showed hazy straw-colored urine protein, rare bacteria CT scan of head showed recent appearing left superior cerebellar artery infarct.  Patient was admitted under hospitalist service for acute cerebellar stroke. Neurology consultation was called. MRI of the brain, MRA head and neck showed: 1. Moderately large acute left SCA territory infarct. 2. Chronic small vessel ischemic disease with chronic lacunar infarcts. 3. Advanced intracranial atherosclerosis with severe anterior and posterior circulation stenoses. Echocardiogram showed EF 60 to 65% LVH and impaired relaxation. Carotid duplex did not show any significant stenosis.  Subjective: Patient was seen and examined this afternoon.  Not in distress.  No new symptoms on blood pressure remains elevated in the 180s.  Assessment/Plan: Acute left superior cerebellar artery infarct Advanced intracranial atherosclerosis with severe anterior and posterior circulation stenosis -Presented with acute persistent dizziness, nausea, vomiting and recurrent falls for 2 days -CT scan of  head, MRI brain, MRA head and neck and carotid duplex findings as above. -No cardiac source of embolism found on echocardiogram -Neurology consultation obtained. Started on aspirin 81 mg daily, Plavix 75 mg daily. Continue aspirin and Plavix for 3 weeks and then aspirin alone, starting 08/25/2019. Lipitor dose increased to 80 mg daily. -TEE obtained, 10/12.  No evidence of LAA thrombus, PFO.  Hypertensive urgency -Blood pressure on arrival elevated to 233/128. -Chronically uncontrolled hypertension, likely cause of lacunar infarcts and microvascular changes in the brain. -Home meds include amlodipine 10 mg daily, atenolol 100 mg daily and hydralazine 100 mg twice daily. -Her blood pressure has been hard to control.  We have been titrating her medications. -Currently on amlodipine 10, atenolol 100, hydralazine 100 mg 3 times daily and clonidine 0.1 mg twice daily. -Blood pressure seems to be better with this regimen.  Continue same.  Acute kidney injury -Creatinine normal at baseline.  Presented with creatinine elevated to 1.28.  -Repeat creatinine shows an improvement to normal.  Hypokalemia -Potassium level running low mostly.  With replacement, improved with treatment for today.    Leukocytosis -WBC elevated to 15.6 on admission, no evidence of infection. -Repeat CBC shows an improvement to normal.  Diabetes mellitus type 2 - A1c 14.2 -I do not see any insulin or other diabetes medications on her home list. -Currently patient is on Lantus 20 units daily and sliding scale insulin with Accu-Cheks. -Diabetes coordinator consult appreciated. -Has also been started on glipizide 2.5 mg daily.  Hyperlipidemia -LDL 223, goal less than 70 -Home meds include Lipitor 20 mg daily.  Dose increased to 80 mg daily.  Mobility: Encourage ambulation. PT eval obtained.  CIR denied.  SNF pending.  DVT prophylaxis: Lovenox subcu Code Status:Code Status: Full Code  Family Communication:  Expected Discharge: Pending SNF placement.  Consultants:  Neurology  Procedures:    Antimicrobials: Anti-infectives (From admission, onward)   None      Diet Order            Diet heart healthy/carb modified Room service appropriate? Yes; Fluid consistency: Thin  Diet effective now              Infusions:    Scheduled Meds: . amLODipine  10 mg Oral QPC supper  . aspirin EC  81 mg Oral Daily  . atenolol  100 mg Oral Daily  . atorvastatin  40 mg Oral Daily  . cloNIDine  0.1 mg Oral BID  . clopidogrel  75 mg Oral Daily  . enoxaparin (LOVENOX) injection  40 mg Subcutaneous Q24H  . glipiZIDE  2.5 mg Oral Q breakfast  . hydrALAZINE  100 mg Oral Q8H  . insulin aspart  0-15 Units Subcutaneous TID WC  . insulin aspart  0-5 Units Subcutaneous QHS  . insulin glargine  20 Units Subcutaneous Daily    PRN meds: acetaminophen **OR** acetaminophen (TYLENOL) oral liquid 160 mg/5 mL **OR** acetaminophen, hydrALAZINE, senna-docusate   Objective: Vitals:   08/08/19 0507 08/08/19 0826  BP: 128/76 135/67  Pulse: (!) 59 71  Resp: 14 20  Temp: 98.9 F (37.2 C) 98.8 F (37.1 C)  SpO2: 100% 98%    Intake/Output Summary (Last 24 hours) at 08/08/2019 1159 Last data filed at 08/08/2019 0600 Gross per 24 hour  Intake 360 ml  Output -  Net 360 ml   Filed Weights   08/02/19 1606 08/08/19 0507  Weight: 77.1 kg 77 kg   Weight change:  Body mass index is 31.05 kg/m.   Physical Exam: General exam: Appears calm and comfortable.  Skin: No rashes, lesions or ulcers. HEENT: Atraumatic, normocephalic, supple neck, no obvious bleeding Lungs: Clear to auscultation bilaterally CVS: Regular rate and rhythm, no murmur GI/Abd soft, nontender, nondistended, bowel sound present CNS: Alert, awake, slow to respond but oriented to place, person, struggles with time.  Psychiatry: Mood appropriate Extremities: No pedal edema, no calf tenderness  Data Review: I have personally  reviewed the laboratory data and studies available.  Recent Labs  Lab 08/02/19 0240 08/02/19 0558 08/05/19 0521 08/06/19 1200 08/07/19 0437  WBC 15.6*  --  8.2 8.4 9.9  NEUTROABS 13.4*  --  4.8 5.5 6.8  HGB 14.9 14.6 12.5 12.8 11.6*  HCT 47.8* 43.0 39.0 39.3 35.5*  MCV 88.7  --  86.1 86.0 86.0  PLT 408*  --  283 265 268   Recent Labs  Lab 08/02/19 0240 08/02/19 0558 08/05/19 0521 08/06/19 1200 08/07/19 0437 08/08/19 0910  NA 141 144 137 138 138 135  K 3.6 3.5 2.5* 3.7 3.0* 3.4*  CL 96*  --  99 102 102 101  CO2 24  --  28 27 26 25   GLUCOSE 570*  --  184* 128* 97 209*  BUN 29*  --  18 16 14 11   CREATININE 1.28*  --  0.72 0.67 0.72 0.81  CALCIUM 10.0  --  8.7* 8.6* 9.1 8.8*  MG  --   --  1.7  --   --   --      , MD  [facility] 08/08/2019

## 2019-08-09 LAB — GLUCOSE, CAPILLARY
Glucose-Capillary: 106 mg/dL — ABNORMAL HIGH (ref 70–99)
Glucose-Capillary: 207 mg/dL — ABNORMAL HIGH (ref 70–99)
Glucose-Capillary: 263 mg/dL — ABNORMAL HIGH (ref 70–99)
Glucose-Capillary: 181 mg/dL — ABNORMAL HIGH (ref 70–99)

## 2019-08-09 LAB — BASIC METABOLIC PANEL
Anion gap: 15 (ref 5–15)
BUN: 12 mg/dL (ref 8–23)
CO2: 20 mmol/L — ABNORMAL LOW (ref 22–32)
Calcium: 8.9 mg/dL (ref 8.9–10.3)
Chloride: 104 mmol/L (ref 98–111)
Creatinine, Ser: 0.84 mg/dL (ref 0.44–1.00)
GFR calc Af Amer: >60 mL/min (ref >60)
GFR calc non Af Amer: >60 mL/min (ref >60)
Glucose, Bld: 84 mg/dL (ref 70–99)
Potassium: 4 mmol/L (ref 3.5–5.1)
Sodium: 139 mmol/L (ref 135–145)

## 2019-08-09 LAB — SARS CORONAVIRUS 2 (TAT 6-24 HRS): SARS Coronavirus 2: NEGATIVE

## 2019-08-09 MED ORDER — B COMPLEX-C PO TABS
1.0000 | ORAL_TABLET | Freq: Every day | ORAL | Status: DC
  Administered 2019-08-09 – 2019-08-10 (×2): 1 via ORAL
  Filled 2019-08-09 (×2): qty 1

## 2019-08-09 NOTE — TOC Progression Note (Addendum)
Transition of Care [facility]) - Progression Note    Patient Details  Name: Lauren Waller MRN: [id number] Date of Birth: 02-12-1956  Transition of [facility]) CM/SW [place], [place] Phone Number: 08/09/2019, 9:15 AM  Clinical Narrative:  CSW received call from patient's niece, Darylene Price, that she never received email from [facility] yesterday. CSW verified niece's email address and sent bed offers with CMS list to niece's email address. Niece to review and select a SNF. CSW to follow.   UPDATE 10:30: Patient will need updated COVID test prior to discharge to SNF. CSW asked MD to order, alerted RN to collect. CSW to follow.    Expected Discharge Plan: [place] Barriers to Discharge: Continued Medical Work up, Orthoptist and Services Expected Discharge Plan: [place] In-house Referral: Clinical Social Work Discharge Planning Services: [employer] Consult Post Acute Care Choice: [place]                                         Social Determinants of Health (SDOH) Interventions    Readmission Risk Interventions No flowsheet data found.

## 2019-08-09 NOTE — Progress Notes (Addendum)
Physical Therapy Treatment Patient Details Name: Lauren Waller MRN: [id number] DOB: Nov 04, 1955 Today's Date: 08/09/2019    History of Present Illness This 63 y.o. female admitted after being found on the floor by her bed.  CT of head showed LT superior cerebellar artery infarct.  MRI showed moderately large acute Lt SCA terrirtory infarct, chronic small vessel ischemic disease with chronic lacunar infarcts, and advanced intracranial atherosclerosis with severe anterior and posterior circulation stenosis.  PMH Includes: DM, HTN, depression, chronic LBP    PT Comments    Pt progressing steadily towards physical therapy goals. Ambulating 5 feet with walker and two person moderate assist. Participated in pre gait training and functional task at sink. Continues with ataxic gait, static and dynamic balance impairments, and decreased cognition. Continue to recommend comprehensive inpatient rehab (CIR) for post-acute therapy needs.    Follow Up Recommendations  CIR;Supervision/Assistance - 24 hour     Equipment Recommendations  None recommended by PT    Recommendations for Other Services       Precautions / Restrictions Precautions Precautions: Fall Precaution Comments: ataxic Restrictions Weight Bearing Restrictions: No    Mobility  Bed Mobility Overal bed mobility: Needs Assistance Bed Mobility: Supine to Sit     Supine to sit: Min assist     General bed mobility comments: Pt sitting up with use of bed rail, but then with abrupt posterior loss of balance, requires minA to pull up to sit on 2nd attempt  Transfers Overall transfer level: Needs assistance Equipment used: Rolling walker (2 wheeled) Transfers: Sit to/from Stand Sit to Stand: +2 safety/equipment;Mod assist;Min assist         General transfer comment: Min-modA to stand multiple times, cues for hand/foot placement.   Ambulation/Gait Ambulation/Gait assistance: Mod assist;+2 physical assistance;+2  safety/equipment Gait Distance (Feet): 5 Feet Assistive device: Rolling walker (2 wheeled) Gait Pattern/deviations: Step-to pattern;Decreased stance time - left;Shuffle;Trunk flexed;Ataxic;Decreased step length - left;Staggering left Gait velocity: decreased   General Gait Details: ModA + 2 for balance, ataxic gait, staggering left, cues for upright and looking up in addition to safety awareness. Close chair follow utilized. Manual assist provided for walker positioning   Stairs             Wheelchair Mobility    Modified Rankin (Stroke Patients Only) Modified Rankin (Stroke Patients Only) Pre-Morbid Rankin Score: Moderate disability Modified Rankin: Moderately severe disability     Balance Overall balance assessment: Needs assistance Sitting-balance support: Feet supported Sitting balance-Leahy Scale: Fair     Standing balance support: During functional activity;Single extremity supported Standing balance-Leahy Scale: Poor Standing balance comment: Reliant on at least single UE support at sink brushing teeth, additional support from leaning against sink. One lateral LOB to the left requiring assist to correct                            Cognition Arousal/Alertness: Lethargic Behavior During Therapy: Flat affect;Impulsive Overall Cognitive Status: No family/caregiver present to determine baseline cognitive functioning Area of Impairment: Attention;Awareness;Problem solving;Following commands;Safety/judgement                   Current Attention Level: Sustained   Following Commands: Follows one step commands inconsistently;Follows one step commands with increased time Safety/Judgement: Decreased awareness of safety;Decreased awareness of deficits Awareness: Intellectual Problem Solving: Slow processing;Difficulty sequencing;Requires verbal cues;Requires tactile cues General Comments: Pt sleepy, needs stimulation for command following and initiation.  Decreased awareness of deficits/safety i.e. seemingly no  righting reactions with loss of balance      Exercises Other Exercises Other Exercises: Pre gait training including weight shifting and postural re-education in standing    General Comments        Pertinent Vitals/Pain Pain Assessment: Faces Faces Pain Scale: No hurt    Home Living                      Prior Function            PT Goals (current goals can now be found in the care plan section) Acute Rehab PT Goals Patient Stated Goal: "get better." Potential to Achieve Goals: Fair Progress towards PT goals: Progressing toward goals    Frequency    Min 4X/week      PT Plan Current plan remains appropriate    Co-evaluation PT/OT/SLP Co-Evaluation/Treatment: Yes Reason for Co-Treatment: Complexity of the patient's impairments (multi-system involvement);Necessary to address cognition/behavior during functional activity;For patient/therapist safety;To address functional/ADL transfers PT goals addressed during session: Mobility/safety with mobility        AM-PAC PT "6 Clicks" Mobility   Outcome Measure  Help needed turning from your back to your side while in a flat bed without using bedrails?: A Little Help needed moving from lying on your back to sitting on the side of a flat bed without using bedrails?: A Little Help needed moving to and from a bed to a chair (including a wheelchair)?: A Little Help needed standing up from a chair using your arms (e.g., wheelchair or bedside chair)?: A Little Help needed to walk in hospital room?: A Lot Help needed climbing 3-5 steps with a railing? : Total 6 Click Score: 15    End of Session Equipment Utilized During Treatment: Gait belt Activity Tolerance: Patient tolerated treatment well Patient left: in chair;with chair alarm set;with call bell/phone within reach Nurse Communication: Mobility status PT Visit Diagnosis: Unsteadiness on feet (R26.81);Other  abnormalities of gait and mobility (R26.89);Repeated falls (R29.6);Muscle weakness (generalized) (M62.81);Ataxic gait (R26.0);Other symptoms and signs involving the nervous system (R29.898);Difficulty in walking, not elsewhere classified (R26.2)     Time: 4431-5400 PT Time Calculation (min) (ACUTE ONLY): 18 min  Charges:  $Therapeutic Activity: 8-22 mins                     Ellamae Sia, PT, DPT Acute Rehabilitation Services Pager [phone] Office [phone]    Willy Eddy 08/09/2019, 12:32 PM

## 2019-08-09 NOTE — Progress Notes (Signed)
Occupational Therapy Treatment Patient Details Name: Lauren Waller MRN: [id number] DOB: 25-Apr-1956 Today's Date: 08/09/2019    History of present illness This 63 y.o. female admitted after being found on the floor by her bed.  CT of head showed LT superior cerebellar artery infarct.  MRI showed moderately large acute Lt SCA terrirtory infarct, chronic small vessel ischemic disease with chronic lacunar infarcts, and advanced intracranial atherosclerosis with severe anterior and posterior circulation stenosis.  PMH Includes: DM, HTN, depression, chronic LBP   OT comments  Pt progressing to further mobility per chart this session. Pt stood at sink with constant cueing and modA+2 for instability in standing. Pt minA set-upA for task with assist for stability. Pt requires modA overall for ADL.  Pt requiring constant cueing for following commands. PT requires assist for LOB episodes. Dysmetria noted in BUEs. Pt would benefit from continued OT skilled services for ADL, mobility and safety in CIR setting. OT following acutely.      Follow Up Recommendations  CIR;Supervision/Assistance - 24 hour    Equipment Recommendations  None recommended by OT    Recommendations for Other Services      Precautions / Restrictions Precautions Precautions: Fall Precaution Comments: ataxic Restrictions Weight Bearing Restrictions: No       Mobility Bed Mobility Overal bed mobility: Needs Assistance Bed Mobility: Supine to Sit     Supine to sit: Min assist     General bed mobility comments: Pt sitting up with use of bed rail, but then with abrupt posterior loss of balance, requires minA to pull up to sit on 2nd attempt  Transfers Overall transfer level: Needs assistance Equipment used: Rolling walker (2 wheeled) Transfers: Sit to/from Stand Sit to Stand: +2 safety/equipment;Mod assist;Min assist         General transfer comment: Min-modA to stand multiple times, cues for hand/foot  placement.     Balance Overall balance assessment: Needs assistance Sitting-balance support: Feet supported Sitting balance-Leahy Scale: Fair     Standing balance support: During functional activity;Single extremity supported Standing balance-Leahy Scale: Poor Standing balance comment: Reliant on at least single UE support at sink brushing teeth, additional support from leaning against sink. One lateral LOB to the left requiring assist to correct                           ADL either performed or assessed with clinical judgement   ADL Overall ADL's : Needs assistance/impaired Eating/Feeding: Minimal assistance;Sitting                                   Functional mobility during ADLs: Moderate assistance;+2 for safety/equipment;+2 for physical assistance;Rolling walker General ADL Comments: Pt impulsive, ataxic and with difficulty following commands      Vision Baseline Vision/History: No visual deficits Patient Visual Report: No change from baseline Vision Assessment?: No apparent visual deficits   Perception     Praxis      Cognition Arousal/Alertness: Lethargic Behavior During Therapy: Flat affect;Impulsive Overall Cognitive Status: No family/caregiver present to determine baseline cognitive functioning Area of Impairment: Attention;Awareness;Problem solving;Following commands;Safety/judgement                   Current Attention Level: Sustained   Following Commands: Follows one step commands inconsistently;Follows one step commands with increased time Safety/Judgement: Decreased awareness of safety;Decreased awareness of deficits Awareness: Intellectual Problem Solving: Slow processing;Difficulty sequencing;Requires verbal  cues;Requires tactile cues General Comments: Pt sleepy, needs stimulation for command following and initiation. Decreased awareness of deficits/safety i.e. seemingly no righting reactions with loss of balance         Exercises Exercises: Other exercises Other Exercises Other Exercises: Pre gait training including weight shifting and postural re-education in standing   Shoulder Instructions       General Comments      Pertinent Vitals/ Pain       Pain Assessment: Faces Faces Pain Scale: No hurt  Home Living                                          Prior Functioning/Environment Level of Independence: Independent with assistive device(s)        Comments: Pt ambulates with SPC.  Pt does not drive and relies on public transportation.  she has a nurse that comes in 3x a week   Frequency  Min 2X/week        Progress Toward Goals  OT Goals(current goals can now be found in the care plan section)     Acute Rehab OT Goals Patient Stated Goal: "get better." OT Goal Formulation: With patient/family Time For Goal Achievement: 08/17/19 Potential to Achieve Goals: Good  Plan      Co-evaluation    PT/OT/SLP Co-Evaluation/Treatment: Yes Reason for Co-Treatment: For patient/therapist safety PT goals addressed during session: Mobility/safety with mobility OT goals addressed during session: ADL's and self-care      AM-PAC OT "6 Clicks" Daily Activity     Outcome Measure   Help from another person eating meals?: A Little Help from another person taking care of personal grooming?: A Little Help from another person toileting, which includes using toliet, bedpan, or urinal?: A Lot Help from another person bathing (including washing, rinsing, drying)?: A Lot Help from another person to put on and taking off regular upper body clothing?: A Lot Help from another person to put on and taking off regular lower body clothing?: A Lot 6 Click Score: 14    End of Session Equipment Utilized During Treatment: Rolling walker;Gait belt  OT Visit Diagnosis: Unsteadiness on feet (R26.81);History of falling (Z91.81);Ataxia, unspecified (R27.0);Cognitive communication deficit  (R41.841) Symptoms and signs involving cognitive functions: Cerebral infarction   Activity Tolerance Patient tolerated treatment well;Patient limited by fatigue   Patient Left in chair;with call bell/phone within reach;with chair alarm set;with nursing/sitter in room;with family/visitor present   Nurse Communication Mobility status        Time: 5329-9242 OT Time Calculation (min): 27 min  Charges: OT General Charges $OT Visit: 1 Visit OT Treatments $Self Care/Home Management : 8-22 mins  Darryl Nestle) Marsa Aris OTR/L Acute Rehabilitation Services Pager: [phone] Office: [phone]    Jenene Slicker Samiha Denapoli 08/09/2019, 4:05 PM

## 2019-08-09 NOTE — Progress Notes (Signed)
PROGRESS NOTE  Lauren Waller  DOB: 1956/09/14  PCP: Benito Mccreedy, MD ATF:[id number]  DOA: 08/02/2019  LOS: 7 days   Brief narrative: Lauren Waller is a 63 y.o. female with PMH of HTN, DM2, HLD, low back pain.  Depression who lives alone at home. Patient presented to the ED on 08/02/2019 after having been found on the floor by her bed.   Apparently, for last several days, patient was having nausea, vomiting and persistent dizziness with recurrent falls.  She had a fall and could not get up by herself.  EMS was called.  Blood sugar was noted to be elevated to 515 and blood pressure was more than 270 systolic.  She is brought to the ED.   In the ED, patient was afebrile.  Blood pressure was elevated at 233/128. Work-up showed sodium 141, potassium 3.6, glucose 570, BUN/creatinine 29/1.28, WBC count 15.6, hemoglobin 14.9, platelet 408. Urinalysis showed hazy straw-colored urine protein, rare bacteria CT scan of head showed recent appearing left superior cerebellar artery infarct.  Patient was admitted under hospitalist service for acute cerebellar stroke. Neurology consultation was called. MRI of the brain, MRA head and neck showed: 1. Moderately large acute left SCA territory infarct. 2. Chronic small vessel ischemic disease with chronic lacunar infarcts. 3. Advanced intracranial atherosclerosis with severe anterior and posterior circulation stenoses. Echocardiogram showed EF 60 to 65% LVH and impaired relaxation. Carotid duplex did not show any significant stenosis.  Subjective: Patient was seen and examined this afternoon.  Not in distress.  Blood pressure much improved. Updated patient's niece Ms. Nichola at bedside.  Assessment/Plan: Acute left superior cerebellar artery infarct Advanced intracranial atherosclerosis with severe anterior and posterior circulation stenosis -Presented with acute persistent dizziness, nausea, vomiting and recurrent falls for 2 days  -CT scan of head, MRI brain, MRA head and neck and carotid duplex findings as above. -No cardiac source of embolism found on echocardiogram -Neurology consultation obtained. Started on aspirin 81 mg daily, Plavix 75 mg daily. Continue aspirin and Plavix for 3 weeks and then aspirin alone, starting 08/25/2019. Lipitor dose increased to 80 mg daily. -TEE obtained, 10/12.  No evidence of LAA thrombus, PFO.  Hypertensive urgency -Blood pressure on arrival elevated to 233/128. -Chronically uncontrolled hypertension, likely cause of lacunar infarcts and microvascular changes in the brain. -Home meds include amlodipine, atenolol and hydralazine. -Her blood pressure has been hard to control.  We have been titrating her medications. -Currently on amlodipine 10, atenolol 100, hydralazine 100 mg 3 times daily and clonidine 0.1 mg twice daily. -Blood pressure seems to be better with this regimen.  Continue same.  Acute kidney injury -Creatinine normal at baseline.  Presented with creatinine elevated to 1.28.  -Repeat creatinine shows an improvement to normal.  Leukocytosis -WBC elevated to 15.6 on admission, no evidence of infection. -Repeat CBC shows an improvement to normal.  Diabetes mellitus type 2 - A1c 14.2 -I do not see any insulin or other diabetes medications on her home list. -Currently patient is on glipizide 2.5 mg daily and Lantus 20 units daily with sliding scale insulin.   Hyperlipidemia -LDL 223, goal less than 70 -Home meds include Lipitor 20 mg daily.  Dose increased to 80 mg daily.  Mobility: Encourage ambulation. PT eval obtained.  CIR denied.  SNF pending. DVT prophylaxis: Lovenox subcu Code Status:Code Status: Full Code  Family Communication: Expected Discharge: Pending SNF placement.  Medically stable for discharge.  Consultants:  Neurology  Procedures:    Antimicrobials: Anti-infectives (From admission, onward)  None      Diet Order             Diet heart healthy/carb modified Room service appropriate? Yes; Fluid consistency: Thin  Diet effective now              Infusions:    Scheduled Meds: . amLODipine  10 mg Oral QPC supper  . aspirin EC  81 mg Oral Daily  . atenolol  100 mg Oral Daily  . atorvastatin  40 mg Oral Daily  . B-complex with vitamin C  1 tablet Oral Daily  . cloNIDine  0.1 mg Oral BID  . clopidogrel  75 mg Oral Daily  . enoxaparin (LOVENOX) injection  40 mg Subcutaneous Q24H  . glipiZIDE  2.5 mg Oral Q breakfast  . hydrALAZINE  100 mg Oral Q8H  . insulin aspart  0-15 Units Subcutaneous TID WC  . insulin aspart  0-5 Units Subcutaneous QHS  . insulin glargine  20 Units Subcutaneous Daily    PRN meds: acetaminophen **OR** acetaminophen (TYLENOL) oral liquid 160 mg/5 mL **OR** acetaminophen, hydrALAZINE, senna-docusate   Objective: Vitals:   08/09/19 0744 08/09/19 1204  BP: 132/61 (!) 145/70  Pulse: 72 (!) 59  Resp: 14 20  Temp: 98.9 F (37.2 C) 98.8 F (37.1 C)  SpO2: 98% 98%    Intake/Output Summary (Last 24 hours) at 08/09/2019 1609 Last data filed at 08/09/2019 0630 Gross per 24 hour  Intake 480 ml  Output -  Net 480 ml   Filed Weights   08/02/19 1606 08/08/19 0507  Weight: 77.1 kg 77 kg   Weight change:  Body mass index is 31.05 kg/m.   Physical Exam: General exam: Appears calm and comfortable.  Skin: No rashes, lesions or ulcers. HEENT: Atraumatic, normocephalic, supple neck, no obvious bleeding Lungs: Clear to auscultation bilaterally CVS: Regular rate and rhythm, no murmur GI/Abd soft, nontender, nondistended, bowel sound present CNS: Alert, awake, slow to respond but oriented to place, person, struggles with time.   Psychiatry: Mood appropriate Extremities: No pedal edema, no calf tenderness  Data Review: I have personally reviewed the laboratory data and studies available.  Recent Labs  Lab 08/05/19 0521 08/06/19 1200 08/07/19 0437  WBC 8.2 8.4 9.9  NEUTROABS  4.8 5.5 6.8  HGB 12.5 12.8 11.6*  HCT 39.0 39.3 35.5*  MCV 86.1 86.0 86.0  PLT 283 265 268   Recent Labs  Lab 08/05/19 0521 08/06/19 1200 08/07/19 0437 08/08/19 0910 08/09/19 0423  NA 137 138 138 135 139  K 2.5* 3.7 3.0* 3.4* 4.0  CL 99 102 102 101 104  CO2 28 27 26 25  20*  GLUCOSE 184* 128* 97 209* 84  BUN 18 16 14 11 12   CREATININE 0.72 0.67 0.72 0.81 0.84  CALCIUM 8.7* 8.6* 9.1 8.8* 8.9  MG 1.7  --   --   --   --      , MD  [facility] 08/09/2019

## 2019-08-09 NOTE — TOC Progression Note (Signed)
Transition of Care [facility]) - Progression Note    Patient Details  Name: Earlyne Feeser MRN: [id number] Date of Birth: 18-Apr-1956  Transition of Care [facility]) CM/SW [place], [place] Phone Number: 08/09/2019, 7:49 PM  Clinical Narrative:   CSW received call back from patient's niece, Darylene Price, with choice of [facility]. CSW answered questions about next steps and expectations. CSW updated [facility] about choice.  CSW contacted [facility] to update them on the SNF choice. [facility] called back with authorization details.  Patient will need updated COVID, CSW asked MD for test and checked in with RN about collecting. COVID test not resulted, can DC when test results.    Expected Discharge Plan: Skilled Nursing Facility Barriers to Discharge: Other (comment)(COVID test)  Expected Discharge Plan and Services Expected Discharge Plan: [place] In-house Referral: Clinical Social Work Discharge Planning Services: CM Consult Post Acute Care Choice: [facility]                                         Social Determinants of Health (SDOH) Interventions    Readmission Risk Interventions No flowsheet data found.

## 2019-08-10 DIAGNOSIS — R410 Disorientation, unspecified: Secondary | ICD-10-CM | POA: Diagnosis not present

## 2019-08-10 DIAGNOSIS — G459 Transient cerebral ischemic attack, unspecified: Secondary | ICD-10-CM | POA: Diagnosis not present

## 2019-08-10 DIAGNOSIS — R5381 Other malaise: Secondary | ICD-10-CM | POA: Diagnosis not present

## 2019-08-10 DIAGNOSIS — E1165 Type 2 diabetes mellitus with hyperglycemia: Secondary | ICD-10-CM | POA: Diagnosis not present

## 2019-08-10 DIAGNOSIS — E119 Type 2 diabetes mellitus without complications: Secondary | ICD-10-CM | POA: Diagnosis not present

## 2019-08-10 DIAGNOSIS — S72144D Nondisplaced intertrochanteric fracture of right femur, subsequent encounter for closed fracture with routine healing: Secondary | ICD-10-CM | POA: Diagnosis not present

## 2019-08-10 DIAGNOSIS — R1084 Generalized abdominal pain: Secondary | ICD-10-CM | POA: Diagnosis not present

## 2019-08-10 DIAGNOSIS — R739 Hyperglycemia, unspecified: Secondary | ICD-10-CM | POA: Diagnosis not present

## 2019-08-10 DIAGNOSIS — E785 Hyperlipidemia, unspecified: Secondary | ICD-10-CM | POA: Diagnosis not present

## 2019-08-10 DIAGNOSIS — M2550 Pain in unspecified joint: Secondary | ICD-10-CM | POA: Diagnosis not present

## 2019-08-10 DIAGNOSIS — I63542 Cerebral infarction due to unspecified occlusion or stenosis of left cerebellar artery: Secondary | ICD-10-CM | POA: Diagnosis not present

## 2019-08-10 DIAGNOSIS — I6930 Unspecified sequelae of cerebral infarction: Secondary | ICD-10-CM | POA: Diagnosis not present

## 2019-08-10 DIAGNOSIS — K5900 Constipation, unspecified: Secondary | ICD-10-CM | POA: Diagnosis not present

## 2019-08-10 DIAGNOSIS — U071 COVID-19: Secondary | ICD-10-CM | POA: Diagnosis not present

## 2019-08-10 DIAGNOSIS — W19XXXD Unspecified fall, subsequent encounter: Secondary | ICD-10-CM | POA: Diagnosis not present

## 2019-08-10 DIAGNOSIS — M6281 Muscle weakness (generalized): Secondary | ICD-10-CM | POA: Diagnosis not present

## 2019-08-10 DIAGNOSIS — R438 Other disturbances of smell and taste: Secondary | ICD-10-CM | POA: Diagnosis not present

## 2019-08-10 DIAGNOSIS — I639 Cerebral infarction, unspecified: Secondary | ICD-10-CM | POA: Diagnosis not present

## 2019-08-10 DIAGNOSIS — R112 Nausea with vomiting, unspecified: Secondary | ICD-10-CM | POA: Diagnosis not present

## 2019-08-10 DIAGNOSIS — I10 Essential (primary) hypertension: Secondary | ICD-10-CM | POA: Diagnosis not present

## 2019-08-10 DIAGNOSIS — R109 Unspecified abdominal pain: Secondary | ICD-10-CM | POA: Diagnosis not present

## 2019-08-10 DIAGNOSIS — R110 Nausea: Secondary | ICD-10-CM | POA: Diagnosis not present

## 2019-08-10 DIAGNOSIS — Z7401 Bed confinement status: Secondary | ICD-10-CM | POA: Diagnosis not present

## 2019-08-10 DIAGNOSIS — K219 Gastro-esophageal reflux disease without esophagitis: Secondary | ICD-10-CM | POA: Diagnosis not present

## 2019-08-10 LAB — BASIC METABOLIC PANEL
Anion gap: 9 (ref 5–15)
BUN: 13 mg/dL (ref 8–23)
CO2: 26 mmol/L (ref 22–32)
Calcium: 9 mg/dL (ref 8.9–10.3)
Chloride: 102 mmol/L (ref 98–111)
Creatinine, Ser: 0.84 mg/dL (ref 0.44–1.00)
GFR calc Af Amer: >60 mL/min (ref >60)
GFR calc non Af Amer: >60 mL/min (ref >60)
Glucose, Bld: 117 mg/dL — ABNORMAL HIGH (ref 70–99)
Potassium: 3.3 mmol/L — ABNORMAL LOW (ref 3.5–5.1)
Sodium: 137 mmol/L (ref 135–145)

## 2019-08-10 LAB — GLUCOSE, CAPILLARY
Glucose-Capillary: 117 mg/dL — ABNORMAL HIGH (ref 70–99)
Glucose-Capillary: 339 mg/dL — ABNORMAL HIGH (ref 70–99)
Glucose-Capillary: 140 mg/dL — ABNORMAL HIGH (ref 70–99)

## 2019-08-10 MED ORDER — HYDRALAZINE HCL 100 MG PO TABS: 100.0000 mg | ORAL_TABLET | Freq: Three times a day (TID) | ORAL | 0 refills | Status: AC

## 2019-08-10 MED ORDER — CLOPIDOGREL BISULFATE 75 MG PO TABS: 75.0000 mg | ORAL_TABLET | Freq: Every day | ORAL | 0 refills | Status: AC

## 2019-08-10 MED ORDER — POTASSIUM CHLORIDE CRYS ER 20 MEQ PO TBCR
40.0000 meq | EXTENDED_RELEASE_TABLET | ORAL | Status: DC
  Administered 2019-08-10: 40 meq via ORAL
  Filled 2019-08-10: qty 2

## 2019-08-10 MED ORDER — CLONIDINE HCL 0.1 MG PO TABS: 0.1000 mg | ORAL_TABLET | Freq: Two times a day (BID) | ORAL | 11 refills | Status: DC

## 2019-08-10 MED ORDER — ASPIRIN 81 MG PO TBEC: 81.0000 mg | DELAYED_RELEASE_TABLET | Freq: Every day | ORAL | 0 refills | Status: AC

## 2019-08-10 MED ORDER — B COMPLEX-C PO TABS: 1.0000 | ORAL_TABLET | Freq: Every day | ORAL | 0 refills | Status: AC

## 2019-08-10 MED ORDER — INSULIN GLARGINE 100 UNIT/ML ~~LOC~~ SOLN: 20.0000 [IU] | Freq: Every day | SUBCUTANEOUS | 11 refills | Status: DC

## 2019-08-10 MED ORDER — SENNOSIDES-DOCUSATE SODIUM 8.6-50 MG PO TABS: 1.0000 | ORAL_TABLET | Freq: Every evening | ORAL | 0 refills | Status: DC | PRN

## 2019-08-10 MED ORDER — ATORVASTATIN CALCIUM 40 MG PO TABS: 40.0000 mg | ORAL_TABLET | Freq: Every day | ORAL | 0 refills | Status: AC

## 2019-08-10 MED ORDER — POTASSIUM CHLORIDE 20 MEQ/15ML (10%) PO SOLN
40.0000 meq | Freq: Once | ORAL | Status: AC
  Administered 2019-08-10: 40 meq via ORAL
  Filled 2019-08-10: qty 30

## 2019-08-10 MED ORDER — GLIPIZIDE ER 2.5 MG PO TB24: 2.5000 mg | ORAL_TABLET | Freq: Every day | ORAL | 0 refills | Status: DC

## 2019-08-10 NOTE — TOC Transition Note (Addendum)
Transition of Care [facility]) - CM/SW Discharge Note   Patient Details  Name: Lauren Waller MRN: [id number] Date of Birth: Dec 21, 1955  Transition of Care [facility]) CM/SW Contact:  Gelene Mink, [place] Phone Number: 08/10/2019, 3:23 PM   Clinical Narrative:     Patient will DC to: [facility] date: 08/10/2019  Family notified: Yes  Transport by: Lesle Reek called and scheduled. Informed CSW that they are running 1-2 hours behind. Should you need to call and get another update, please call [place] at [phone], option 1, option 3.*   Per MD patient ready for DC to . RN, patient, patient's family, and facility notified of DC. Discharge Summary and FL2 sent to facility. RN to call report prior to discharge [phone]). The patient is going to room 119.  DC packet on chart. Ambulance transport requested for patient.   CSW will sign off for now as social work intervention is no longer needed. Please consult [place] again if new needs arise.  Raye Slyter, LCSW-A [place]/Clinical Social Work Department Cell: [phone]    Final next level of care: [place] Barriers to Discharge: No Barriers Identified   Patient Goals and CMS Choice Patient states their goals for this hospitalization and ongoing recovery are:: Pt is agreeable to going to [place] Medicare.gov Compare Post Acute Care list provided to:: Patient Represenative (must comment) Choice offered to / list presented to : (Pt's niece)  Discharge Placement   Existing PASRR number confirmed : 08/08/19          Patient chooses bed at: [facility] Patient to be transferred to facility by: [facility] Name of family member notified: Darylene Price Patient and family notified of of transfer: 08/10/19  Discharge Plan and Services In-house Referral: Clinical Social Work Discharge Planning Services: [employer] Consult Post Acute Care  Choice: [facility]          DME Arranged: N/A DME Agency: NA       HH Arranged: NA [place] Agency: NA        Social Determinants of Health ([place]) Interventions     Readmission Risk Interventions No flowsheet data found.

## 2019-08-10 NOTE — Discharge Summary (Signed)
Physician Discharge Summary  Patient ID: Lauren Pellegriniileen Creese-Joslyn MRN: [id number] DOB/AGE: Jun 11, 1956 63 y.o.  Admit date: 08/02/2019 Discharge date: 08/10/2019  Admission Diagnoses:  Discharge Diagnoses:  Principal Problem:   Cerebellar cerebrovascular accident (CVA) without late effect Active Problems:   Diabetes mellitus without complication (HCC)   Hypertension   HLD (hyperlipidemia)   Discharged Condition: stable  Hospital Course: Patient is a [age] year old female with past medical history significant for hypertension, diabetes mellitus type 2, hyperlipidemia, depression and low back pain.  Patient was found down at home.  Prior to presentation, patient had nausea and vomiting for several days, with associated dizziness and recurrent falls.  On EMS arrival,blood sugar was noted to be 515 and the systolic blood pressure was greater than 200 mmHg.  CT scan of the head done on presentation revealed recent appearing left superior cerebellar artery infarct.  Patient was admitted for further assessment and management.  Patient was worked up for acute CVA.  MRI/MRA brain, CT brain and CT cervical spine and vascular studies as documented below.  Neurology team was consulted to assist with patient's management.  Blood pressure was gradually optimized, utilizing permissive hypertension initially.  Patient was managed with aspirin 81 mg p.o. once daily and Plavix 75 mg p.o. once daily.  3 weeks of Plavix is planned, and will be completed on 08/24/2019.  Fasting lipid profile is as documented below, and patient's Lipitor dose has been increased.  Patient will be discharged to skilled nursing facility for subacute rehab.  Blood sugar control has been optimized.  Continue to monitor blood sugar closely.  Check blood sugar at least 4 times daily.   Acute left superior cerebellar artery infarct: -Advanced intracranial atherosclerosis with severe anterior and posterior circulation stenosis -CT scan of  head,MRI brain,MRA head and neck and carotid duplex are as documented.   -No cardiac source of embolism found on echocardiogram -Neurology team assisted in patient's management.  Patient was started on aspirin 81 mg daily,Plavix 75 mg daily. The plan is for patient to take Plavix for 3 weeks, and this will be completed on 08/24/2019.  Patient will be discharged on high-dose statin.    Hypertensive urgency: -Blood pressure on presentation was 233/128 mmHg.   -Patient has chronically uncontrolled hypertension. -Permissive hypertension was initially utilized.  Blood pressure is currently controlled.  Mild acute kidney injury: -Serum creatinine was 1.28 on presentation.   -By AKI has resolved.    Leukocytosis: -Likely reactive.   -No evidence of infection.   Diabetes mellitus type 2 with hemoglobin A1c of 14.2%: -Allergy to Metformin is documented.  -This my first contact with the patient.  -Patient is currently on glipizide 2.5 mg daily, subcutaneous Lantus 20 units daily  and sliding scale insulin.  -Monitor patient's blood sugar at least 4 times daily on discharge.  Hyperlipidemia -Patient will be discharged on high-dose statin regimen.    Consults: neurology, cardiology, physical medicine and rehab.  Significant Diagnostic Studies:  MRI/MRA brain revealed: 1. Moderately large acute left SCA territory infarct. 2. Chronic small vessel ischemic disease with chronic lacunar infarcts as above. 3. Advanced intracranial atherosclerosis with severe anterior and posterior circulation stenoses.   CT head/CT cervical spine without contrast revealed: 1. Recent appearing left superior cerebellar artery infarct. 2. No intracranial hemorrhage or cervical spine fracture.  TEE revealed:  1. Left ventricular ejection fraction, by visual estimation, is 60 to 65%. The left ventricle has hyperdynamic function. There is severely increased left ventricular hypertrophy.  2. Global right  ventricle  has normal systolic function.The right ventricular size is normal. No increase in right ventricular wall thickness.  3. Right atrial size was normal.  4. The mitral valve is normal in structure. Trace mitral valve regurgitation.  5. The tricuspid valve is grossly normal. Tricuspid valve regurgitation is trivial.  6. The aortic valve is tricuspid Aortic valve regurgitation was not visualized by color flow Doppler.  7. The pulmonic valve was grossly normal. Pulmonic valve regurgitation is not visualized by color flow Doppler.  8. No intracardiac thrombi or masses were visualized.  Transthoracic echocardiogram revealed:  1. Left ventricular ejection fraction, by visual estimation, is 60 to 65%. The left ventricle has normal function. Normal left ventricular size. There is moderately increased left ventricular hypertrophy.  2. Elevated mean left atrial pressure.  3. Left ventricular diastolic Doppler parameters are consistent with impaired relaxation pattern of LV diastolic filling.  4. Global right ventricle has normal systolic function.The right ventricular size is normal. No increase in right ventricular wall thickness.  5. Left atrial size was normal.  6. Right atrial size was normal.  7. The mitral valve is normal in structure. No evidence of mitral valve regurgitation. No evidence of mitral stenosis.  8. The tricuspid valve is normal in structure. Tricuspid valve regurgitation was not visualized by color flow Doppler.  9. The aortic valve is normal in structure. Aortic valve regurgitation was not visualized by color flow Doppler. Structurally normal aortic valve, with no evidence of sclerosis or stenosis. 10. The pulmonic valve was normal in structure. Pulmonic valve regurgitation is not visualized by color flow Doppler. 11. The inferior vena cava is normal in size with greater than 50% respiratory variability, suggesting right atrial pressure of 3 mmHg. 12. No embolic source  identified.  Lipid profile revealed: Total cholesterol of 306 HDL of 55 LDL of 223 Triglyceride of 140.  Carotid Doppler ultrasound did not reveal any significant stenosis.  Hemoglobin A1c was 14.2%.  On presentation to the hospital, blood sugar was 570, anion gap of 21, BUN of 29 with serum creatinine of 1.28.  Lipase was 18.  Discharge Exam: Blood pressure 134/67, pulse (!) 58, temperature 98.7 F (37.1 C), temperature source Oral, resp. rate 16, height 5\' 2"  (1.575 m), weight 77 kg, SpO2 98 %.   Disposition: Discharge disposition: 03-Skilled Nursing Facility   Discharge Instructions    Diet - low sodium heart healthy   Complete by: As directed    Increase activity slowly   Complete by: As directed      Allergies as of 08/10/2019      Reactions   Januvia [sitagliptin] Swelling   Metformin And Related Nausea And Vomiting      Medication List    TAKE these medications   amLODipine 10 MG tablet Commonly known as: NORVASC Take 10 mg by mouth daily.   aspirin 81 MG EC tablet Take 1 tablet (81 mg total) by mouth daily. Start taking on: August 11, 2019   atenolol 100 MG tablet Commonly known as: TENORMIN Take 1 tablet by mouth daily   atorvastatin 40 MG tablet Commonly known as: LIPITOR Take 1 tablet (40 mg total) by mouth daily. Start taking on: August 11, 2019 What changed:   medication strength  how much to take   B-complex with vitamin C tablet Take 1 tablet by mouth daily. Start taking on: August 11, 2019   cloNIDine 0.1 MG tablet Commonly known as: CATAPRES Take 1 tablet (0.1 mg total) by mouth 2 (two) times  daily.   clopidogrel 75 MG tablet Commonly known as: PLAVIX Take 1 tablet (75 mg total) by mouth daily. Start taking on: August 11, 2019   glipiZIDE 2.5 MG 24 hr tablet Commonly known as: GLUCOTROL XL Take 1 tablet (2.5 mg total) by mouth daily with breakfast. Start taking on: August 11, 2019   hydrALAZINE 100 MG tablet Commonly  known as: APRESOLINE Take 1 tablet (100 mg total) by mouth every 8 (eight) hours. What changed: when to take this   insulin glargine 100 UNIT/ML injection Commonly known as: LANTUS Inject 0.2 mLs (20 Units total) into the skin daily. Start taking on: August 11, 2019   senna-docusate 8.6-50 MG tablet Commonly known as: Senokot-S Take 1 tablet by mouth at bedtime as needed for mild constipation.        SignedBonnell Public 08/10/2019, 2:43 PM

## 2019-08-10 NOTE — Plan of Care (Signed)
Patient requested a snack after breakfast and has been eating at least 50% of meals today.

## 2019-08-10 NOTE — Progress Notes (Signed)
Called [facility] to give report on patient who will be arriving at the SNF location today. Report give to [place], LPN. All questions answered. Discharge summary will be sent to facility with patient. Awaiting PTAR pick up at this time.

## 2019-08-12 DIAGNOSIS — E1165 Type 2 diabetes mellitus with hyperglycemia: Secondary | ICD-10-CM | POA: Diagnosis not present

## 2019-08-12 DIAGNOSIS — I10 Essential (primary) hypertension: Secondary | ICD-10-CM | POA: Diagnosis not present

## 2019-08-12 DIAGNOSIS — E785 Hyperlipidemia, unspecified: Secondary | ICD-10-CM | POA: Diagnosis not present

## 2019-08-12 DIAGNOSIS — I6930 Unspecified sequelae of cerebral infarction: Secondary | ICD-10-CM | POA: Diagnosis not present

## 2019-08-15 DIAGNOSIS — I10 Essential (primary) hypertension: Secondary | ICD-10-CM | POA: Diagnosis not present

## 2019-08-15 DIAGNOSIS — I639 Cerebral infarction, unspecified: Secondary | ICD-10-CM | POA: Diagnosis not present

## 2019-08-15 DIAGNOSIS — E1165 Type 2 diabetes mellitus with hyperglycemia: Secondary | ICD-10-CM | POA: Diagnosis not present

## 2019-08-15 DIAGNOSIS — E785 Hyperlipidemia, unspecified: Secondary | ICD-10-CM | POA: Diagnosis not present

## 2019-09-03 DIAGNOSIS — I6930 Unspecified sequelae of cerebral infarction: Secondary | ICD-10-CM | POA: Diagnosis not present

## 2019-09-03 DIAGNOSIS — R110 Nausea: Secondary | ICD-10-CM | POA: Diagnosis not present

## 2019-09-03 DIAGNOSIS — E1165 Type 2 diabetes mellitus with hyperglycemia: Secondary | ICD-10-CM | POA: Diagnosis not present

## 2019-09-03 DIAGNOSIS — M6281 Muscle weakness (generalized): Secondary | ICD-10-CM | POA: Diagnosis not present

## 2019-09-09 DIAGNOSIS — I10 Essential (primary) hypertension: Secondary | ICD-10-CM | POA: Diagnosis not present

## 2019-09-09 DIAGNOSIS — R110 Nausea: Secondary | ICD-10-CM | POA: Diagnosis not present

## 2019-09-09 DIAGNOSIS — M6281 Muscle weakness (generalized): Secondary | ICD-10-CM | POA: Diagnosis not present

## 2019-09-09 DIAGNOSIS — I6930 Unspecified sequelae of cerebral infarction: Secondary | ICD-10-CM | POA: Diagnosis not present

## 2019-09-09 DIAGNOSIS — E1165 Type 2 diabetes mellitus with hyperglycemia: Secondary | ICD-10-CM | POA: Diagnosis not present

## 2019-09-12 DIAGNOSIS — R739 Hyperglycemia, unspecified: Secondary | ICD-10-CM | POA: Diagnosis not present

## 2019-09-12 DIAGNOSIS — I6930 Unspecified sequelae of cerebral infarction: Secondary | ICD-10-CM | POA: Diagnosis not present

## 2019-09-12 DIAGNOSIS — R110 Nausea: Secondary | ICD-10-CM | POA: Diagnosis not present

## 2019-09-12 DIAGNOSIS — E1165 Type 2 diabetes mellitus with hyperglycemia: Secondary | ICD-10-CM | POA: Diagnosis not present

## 2019-09-16 DIAGNOSIS — R438 Other disturbances of smell and taste: Secondary | ICD-10-CM | POA: Diagnosis not present

## 2019-09-16 DIAGNOSIS — U071 COVID-19: Secondary | ICD-10-CM | POA: Diagnosis not present

## 2019-09-16 DIAGNOSIS — M6281 Muscle weakness (generalized): Secondary | ICD-10-CM | POA: Diagnosis not present

## 2019-09-16 DIAGNOSIS — I6930 Unspecified sequelae of cerebral infarction: Secondary | ICD-10-CM | POA: Diagnosis not present

## 2019-09-16 DIAGNOSIS — R5381 Other malaise: Secondary | ICD-10-CM | POA: Diagnosis not present

## 2019-09-17 DIAGNOSIS — I6930 Unspecified sequelae of cerebral infarction: Secondary | ICD-10-CM | POA: Diagnosis not present

## 2019-09-17 DIAGNOSIS — K219 Gastro-esophageal reflux disease without esophagitis: Secondary | ICD-10-CM | POA: Diagnosis not present

## 2019-09-17 DIAGNOSIS — R112 Nausea with vomiting, unspecified: Secondary | ICD-10-CM | POA: Diagnosis not present

## 2019-09-17 DIAGNOSIS — E1165 Type 2 diabetes mellitus with hyperglycemia: Secondary | ICD-10-CM | POA: Diagnosis not present

## 2019-09-17 DIAGNOSIS — I10 Essential (primary) hypertension: Secondary | ICD-10-CM | POA: Diagnosis not present

## 2019-09-25 DIAGNOSIS — I10 Essential (primary) hypertension: Secondary | ICD-10-CM | POA: Diagnosis not present

## 2019-09-25 DIAGNOSIS — R739 Hyperglycemia, unspecified: Secondary | ICD-10-CM | POA: Diagnosis not present

## 2019-09-25 DIAGNOSIS — I6930 Unspecified sequelae of cerebral infarction: Secondary | ICD-10-CM | POA: Diagnosis not present

## 2019-09-25 DIAGNOSIS — E785 Hyperlipidemia, unspecified: Secondary | ICD-10-CM | POA: Diagnosis not present

## 2019-09-25 DIAGNOSIS — E1165 Type 2 diabetes mellitus with hyperglycemia: Secondary | ICD-10-CM | POA: Diagnosis not present

## 2019-09-26 DIAGNOSIS — I10 Essential (primary) hypertension: Secondary | ICD-10-CM | POA: Diagnosis not present

## 2019-09-26 DIAGNOSIS — R110 Nausea: Secondary | ICD-10-CM | POA: Diagnosis not present

## 2019-09-26 DIAGNOSIS — R1084 Generalized abdominal pain: Secondary | ICD-10-CM | POA: Diagnosis not present

## 2019-09-26 DIAGNOSIS — K5900 Constipation, unspecified: Secondary | ICD-10-CM | POA: Diagnosis not present

## 2019-09-26 DIAGNOSIS — I6930 Unspecified sequelae of cerebral infarction: Secondary | ICD-10-CM | POA: Diagnosis not present

## 2019-09-27 DIAGNOSIS — R1084 Generalized abdominal pain: Secondary | ICD-10-CM | POA: Diagnosis not present

## 2019-09-27 DIAGNOSIS — R739 Hyperglycemia, unspecified: Secondary | ICD-10-CM | POA: Diagnosis not present

## 2019-09-27 DIAGNOSIS — R110 Nausea: Secondary | ICD-10-CM | POA: Diagnosis not present

## 2019-09-27 DIAGNOSIS — I10 Essential (primary) hypertension: Secondary | ICD-10-CM | POA: Diagnosis not present

## 2019-09-27 DIAGNOSIS — E785 Hyperlipidemia, unspecified: Secondary | ICD-10-CM | POA: Diagnosis not present

## 2019-09-27 DIAGNOSIS — E559 Vitamin D deficiency, unspecified: Secondary | ICD-10-CM | POA: Diagnosis not present

## 2019-09-27 DIAGNOSIS — I6930 Unspecified sequelae of cerebral infarction: Secondary | ICD-10-CM | POA: Diagnosis not present

## 2019-09-27 DIAGNOSIS — E119 Type 2 diabetes mellitus without complications: Secondary | ICD-10-CM | POA: Diagnosis not present

## 2019-09-27 DIAGNOSIS — Z139 Encounter for screening, unspecified: Secondary | ICD-10-CM | POA: Diagnosis not present

## 2019-09-27 DIAGNOSIS — K5900 Constipation, unspecified: Secondary | ICD-10-CM | POA: Diagnosis not present

## 2019-10-03 ENCOUNTER — Other Ambulatory Visit: Payer: Self-pay | Admitting: Cardiology

## 2019-10-04 DIAGNOSIS — I6930 Unspecified sequelae of cerebral infarction: Secondary | ICD-10-CM | POA: Diagnosis not present

## 2019-10-04 DIAGNOSIS — K5900 Constipation, unspecified: Secondary | ICD-10-CM | POA: Diagnosis not present

## 2019-10-04 DIAGNOSIS — R5381 Other malaise: Secondary | ICD-10-CM | POA: Diagnosis not present

## 2019-10-04 DIAGNOSIS — M6281 Muscle weakness (generalized): Secondary | ICD-10-CM | POA: Diagnosis not present

## 2019-10-04 DIAGNOSIS — R438 Other disturbances of smell and taste: Secondary | ICD-10-CM | POA: Diagnosis not present

## 2019-10-15 DIAGNOSIS — I6930 Unspecified sequelae of cerebral infarction: Secondary | ICD-10-CM | POA: Diagnosis not present

## 2019-10-15 DIAGNOSIS — M25551 Pain in right hip: Secondary | ICD-10-CM | POA: Diagnosis not present

## 2019-10-15 DIAGNOSIS — W19XXXA Unspecified fall, initial encounter: Secondary | ICD-10-CM | POA: Diagnosis not present

## 2019-10-15 DIAGNOSIS — M6281 Muscle weakness (generalized): Secondary | ICD-10-CM | POA: Diagnosis not present

## 2019-10-16 DIAGNOSIS — M25551 Pain in right hip: Secondary | ICD-10-CM | POA: Diagnosis not present

## 2019-10-17 ENCOUNTER — Encounter (HOSPITAL_COMMUNITY): Payer: Self-pay

## 2019-10-17 ENCOUNTER — Emergency Department (HOSPITAL_COMMUNITY): Payer: Medicare Other

## 2019-10-17 ENCOUNTER — Encounter (HOSPITAL_COMMUNITY)
Admission: EM | Disposition: A | Discharge status: Skilled Nursing Facility | Payer: Self-pay | Source: Skilled Nursing Facility | Attending: Internal Medicine

## 2019-10-17 ENCOUNTER — Inpatient Hospital Stay (HOSPITAL_COMMUNITY): Payer: Medicare Other

## 2019-10-17 ENCOUNTER — Inpatient Hospital Stay (HOSPITAL_COMMUNITY)
Admission: EM | Admit: 2019-10-17 | Discharge: 2019-10-24 | DRG: 481 | Disposition: A | Discharge status: Skilled Nursing Facility | Payer: Medicare Other | Source: Skilled Nursing Facility | Attending: Internal Medicine | Admitting: Internal Medicine

## 2019-10-17 ENCOUNTER — Inpatient Hospital Stay (HOSPITAL_COMMUNITY): Payer: Medicare Other | Admitting: Certified Registered"

## 2019-10-17 ENCOUNTER — Other Ambulatory Visit: Payer: Self-pay

## 2019-10-17 DIAGNOSIS — T383X5A Adverse effect of insulin and oral hypoglycemic [antidiabetic] drugs, initial encounter: Secondary | ICD-10-CM | POA: Diagnosis not present

## 2019-10-17 DIAGNOSIS — R52 Pain, unspecified: Secondary | ICD-10-CM | POA: Diagnosis not present

## 2019-10-17 DIAGNOSIS — Z7902 Long term (current) use of antithrombotics/antiplatelets: Secondary | ICD-10-CM | POA: Diagnosis not present

## 2019-10-17 DIAGNOSIS — I69322 Dysarthria following cerebral infarction: Secondary | ICD-10-CM

## 2019-10-17 DIAGNOSIS — S72141D Displaced intertrochanteric fracture of right femur, subsequent encounter for closed fracture with routine healing: Secondary | ICD-10-CM | POA: Diagnosis not present

## 2019-10-17 DIAGNOSIS — E785 Hyperlipidemia, unspecified: Secondary | ICD-10-CM | POA: Diagnosis not present

## 2019-10-17 DIAGNOSIS — M4802 Spinal stenosis, cervical region: Secondary | ICD-10-CM | POA: Diagnosis present

## 2019-10-17 DIAGNOSIS — S72141A Displaced intertrochanteric fracture of right femur, initial encounter for closed fracture: Secondary | ICD-10-CM | POA: Diagnosis not present

## 2019-10-17 DIAGNOSIS — M79604 Pain in right leg: Secondary | ICD-10-CM | POA: Diagnosis not present

## 2019-10-17 DIAGNOSIS — E160 Drug-induced hypoglycemia without coma: Secondary | ICD-10-CM | POA: Diagnosis not present

## 2019-10-17 DIAGNOSIS — R0902 Hypoxemia: Secondary | ICD-10-CM | POA: Diagnosis not present

## 2019-10-17 DIAGNOSIS — K5900 Constipation, unspecified: Secondary | ICD-10-CM | POA: Diagnosis not present

## 2019-10-17 DIAGNOSIS — Z794 Long term (current) use of insulin: Secondary | ICD-10-CM

## 2019-10-17 DIAGNOSIS — M5030 Other cervical disc degeneration, unspecified cervical region: Secondary | ICD-10-CM | POA: Diagnosis not present

## 2019-10-17 DIAGNOSIS — I69315 Cognitive social or emotional deficit following cerebral infarction: Secondary | ICD-10-CM | POA: Diagnosis not present

## 2019-10-17 DIAGNOSIS — W19XXXA Unspecified fall, initial encounter: Secondary | ICD-10-CM | POA: Diagnosis present

## 2019-10-17 DIAGNOSIS — I6330 Cerebral infarction due to thrombosis of unspecified cerebral artery: Secondary | ICD-10-CM | POA: Diagnosis not present

## 2019-10-17 DIAGNOSIS — S79929A Unspecified injury of unspecified thigh, initial encounter: Secondary | ICD-10-CM | POA: Diagnosis not present

## 2019-10-17 DIAGNOSIS — A419 Sepsis, unspecified organism: Secondary | ICD-10-CM | POA: Diagnosis not present

## 2019-10-17 DIAGNOSIS — R262 Difficulty in walking, not elsewhere classified: Secondary | ICD-10-CM | POA: Diagnosis not present

## 2019-10-17 DIAGNOSIS — E876 Hypokalemia: Secondary | ICD-10-CM | POA: Diagnosis not present

## 2019-10-17 DIAGNOSIS — R410 Disorientation, unspecified: Secondary | ICD-10-CM | POA: Diagnosis not present

## 2019-10-17 DIAGNOSIS — Z79899 Other long term (current) drug therapy: Secondary | ICD-10-CM | POA: Diagnosis not present

## 2019-10-17 DIAGNOSIS — E119 Type 2 diabetes mellitus without complications: Secondary | ICD-10-CM | POA: Diagnosis not present

## 2019-10-17 DIAGNOSIS — M2550 Pain in unspecified joint: Secondary | ICD-10-CM | POA: Diagnosis not present

## 2019-10-17 DIAGNOSIS — Y9289 Other specified places as the place of occurrence of the external cause: Secondary | ICD-10-CM

## 2019-10-17 DIAGNOSIS — Z7982 Long term (current) use of aspirin: Secondary | ICD-10-CM | POA: Diagnosis not present

## 2019-10-17 DIAGNOSIS — Z7401 Bed confinement status: Secondary | ICD-10-CM | POA: Diagnosis not present

## 2019-10-17 DIAGNOSIS — I6930 Unspecified sequelae of cerebral infarction: Secondary | ICD-10-CM | POA: Diagnosis not present

## 2019-10-17 DIAGNOSIS — E11649 Type 2 diabetes mellitus with hypoglycemia without coma: Secondary | ICD-10-CM | POA: Diagnosis present

## 2019-10-17 DIAGNOSIS — Z03818 Encounter for observation for suspected exposure to other biological agents ruled out: Secondary | ICD-10-CM | POA: Diagnosis not present

## 2019-10-17 DIAGNOSIS — M6281 Muscle weakness (generalized): Secondary | ICD-10-CM | POA: Diagnosis not present

## 2019-10-17 DIAGNOSIS — I160 Hypertensive urgency: Secondary | ICD-10-CM | POA: Diagnosis present

## 2019-10-17 DIAGNOSIS — R278 Other lack of coordination: Secondary | ICD-10-CM | POA: Diagnosis not present

## 2019-10-17 DIAGNOSIS — R404 Transient alteration of awareness: Secondary | ICD-10-CM | POA: Diagnosis not present

## 2019-10-17 DIAGNOSIS — Z20828 Contact with and (suspected) exposure to other viral communicable diseases: Secondary | ICD-10-CM | POA: Diagnosis present

## 2019-10-17 DIAGNOSIS — E8809 Other disorders of plasma-protein metabolism, not elsewhere classified: Secondary | ICD-10-CM | POA: Diagnosis not present

## 2019-10-17 DIAGNOSIS — D649 Anemia, unspecified: Secondary | ICD-10-CM | POA: Diagnosis not present

## 2019-10-17 DIAGNOSIS — S299XXA Unspecified injury of thorax, initial encounter: Secondary | ICD-10-CM | POA: Diagnosis not present

## 2019-10-17 DIAGNOSIS — S0990XA Unspecified injury of head, initial encounter: Secondary | ICD-10-CM | POA: Diagnosis not present

## 2019-10-17 DIAGNOSIS — W1830XA Fall on same level, unspecified, initial encounter: Secondary | ICD-10-CM | POA: Diagnosis present

## 2019-10-17 DIAGNOSIS — R2689 Other abnormalities of gait and mobility: Secondary | ICD-10-CM | POA: Diagnosis not present

## 2019-10-17 DIAGNOSIS — S7221XA Displaced subtrochanteric fracture of right femur, initial encounter for closed fracture: Secondary | ICD-10-CM | POA: Diagnosis not present

## 2019-10-17 DIAGNOSIS — Z90710 Acquired absence of both cervix and uterus: Secondary | ICD-10-CM

## 2019-10-17 DIAGNOSIS — S199XXA Unspecified injury of neck, initial encounter: Secondary | ICD-10-CM | POA: Diagnosis not present

## 2019-10-17 DIAGNOSIS — I10 Essential (primary) hypertension: Secondary | ICD-10-CM | POA: Diagnosis not present

## 2019-10-17 DIAGNOSIS — D62 Acute posthemorrhagic anemia: Secondary | ICD-10-CM | POA: Diagnosis not present

## 2019-10-17 DIAGNOSIS — Z741 Need for assistance with personal care: Secondary | ICD-10-CM | POA: Diagnosis not present

## 2019-10-17 DIAGNOSIS — R2681 Unsteadiness on feet: Secondary | ICD-10-CM | POA: Diagnosis not present

## 2019-10-17 DIAGNOSIS — Z419 Encounter for procedure for purposes other than remedying health state, unspecified: Secondary | ICD-10-CM

## 2019-10-17 HISTORY — PX: INTRAMEDULLARY (IM) NAIL INTERTROCHANTERIC: SHX5875

## 2019-10-17 LAB — COMPREHENSIVE METABOLIC PANEL
ALT: 23 U/L (ref 0–44)
AST: 27 U/L (ref 15–41)
Albumin: 2.8 g/dL — ABNORMAL LOW (ref 3.5–5.0)
Alkaline Phosphatase: 74 U/L (ref 38–126)
Anion gap: 10 (ref 5–15)
BUN: 23 mg/dL (ref 8–23)
CO2: 29 mmol/L (ref 22–32)
Calcium: 9.2 mg/dL (ref 8.9–10.3)
Chloride: 101 mmol/L (ref 98–111)
Creatinine, Ser: 0.95 mg/dL (ref 0.44–1.00)
GFR calc Af Amer: >60 mL/min (ref >60)
GFR calc non Af Amer: >60 mL/min (ref >60)
Glucose, Bld: 138 mg/dL — ABNORMAL HIGH (ref 70–99)
Potassium: 3.4 mmol/L — ABNORMAL LOW (ref 3.5–5.1)
Sodium: 140 mmol/L (ref 135–145)
Total Bilirubin: 0.9 mg/dL (ref 0.3–1.2)
Total Protein: 6.4 g/dL — ABNORMAL LOW (ref 6.5–8.1)

## 2019-10-17 LAB — CBC WITH DIFFERENTIAL/PLATELET
Abs Immature Granulocytes: 0.04 10*3/uL (ref 0.00–0.07)
Basophils Absolute: 0 10*3/uL (ref 0.0–0.1)
Basophils Relative: 0 %
Eosinophils Absolute: 0.1 10*3/uL (ref 0.0–0.5)
Eosinophils Relative: 2 %
HCT: 27.4 % — ABNORMAL LOW (ref 36.0–46.0)
Hemoglobin: 8.6 g/dL — ABNORMAL LOW (ref 12.0–15.0)
Immature Granulocytes: 0 %
Lymphocytes Relative: 18 %
Lymphs Abs: 1.7 10*3/uL (ref 0.7–4.0)
MCH: 27.7 pg (ref 26.0–34.0)
MCHC: 31.4 g/dL (ref 30.0–36.0)
MCV: 88.1 fL (ref 80.0–100.0)
Monocytes Absolute: 0.6 10*3/uL (ref 0.1–1.0)
Monocytes Relative: 6 %
Neutro Abs: 6.9 10*3/uL (ref 1.7–7.7)
Neutrophils Relative %: 74 %
Platelets: 325 10*3/uL (ref 150–400)
RBC: 3.11 MIL/uL — ABNORMAL LOW (ref 3.87–5.11)
RDW: 13.3 % (ref 11.5–15.5)
WBC: 9.4 10*3/uL (ref 4.0–10.5)
nRBC: 0 % (ref 0.0–0.2)

## 2019-10-17 LAB — URINALYSIS, ROUTINE W REFLEX MICROSCOPIC
Bacteria, UA: NONE SEEN
Bilirubin Urine: NEGATIVE
Glucose, UA: NEGATIVE mg/dL
Hgb urine dipstick: NEGATIVE
Ketones, ur: NEGATIVE mg/dL
Leukocytes,Ua: NEGATIVE
Nitrite: NEGATIVE
Protein, ur: 100 mg/dL — AB
Specific Gravity, Urine: 1.021 (ref 1.005–1.030)
pH: 6 (ref 5.0–8.0)

## 2019-10-17 LAB — TYPE AND SCREEN
ABO/RH(D): O POS
Antibody Screen: NEGATIVE

## 2019-10-17 LAB — ABO/RH: ABO/RH(D): O POS

## 2019-10-17 LAB — PROTIME-INR
INR: 1 (ref 0.8–1.2)
Prothrombin Time: 13.2 seconds (ref 11.4–15.2)

## 2019-10-17 LAB — SURGICAL PCR SCREEN
MRSA, PCR: NEGATIVE
Staphylococcus aureus: NEGATIVE

## 2019-10-17 LAB — SARS CORONAVIRUS 2 (TAT 6-24 HRS): SARS Coronavirus 2: NEGATIVE

## 2019-10-17 LAB — GLUCOSE, CAPILLARY
Glucose-Capillary: 103 mg/dL — ABNORMAL HIGH (ref 70–99)
Glucose-Capillary: 118 mg/dL — ABNORMAL HIGH (ref 70–99)
Glucose-Capillary: 251 mg/dL — ABNORMAL HIGH (ref 70–99)

## 2019-10-17 LAB — CBG MONITORING, ED
Glucose-Capillary: 118 mg/dL — ABNORMAL HIGH (ref 70–99)
Glucose-Capillary: 121 mg/dL — ABNORMAL HIGH (ref 70–99)

## 2019-10-17 LAB — POC OCCULT BLOOD, ED: Fecal Occult Bld: NEGATIVE

## 2019-10-17 LAB — HEMOGLOBIN A1C
Hgb A1c MFr Bld: 8.2 % — ABNORMAL HIGH (ref 4.8–5.6)
Mean Plasma Glucose: 188.64 mg/dL

## 2019-10-17 SURGERY — FIXATION, FRACTURE, INTERTROCHANTERIC, WITH INTRAMEDULLARY ROD
Anesthesia: General | Site: Leg Upper | Laterality: Right

## 2019-10-17 MED ORDER — MEPERIDINE HCL 25 MG/ML IJ SOLN: 6.2500 mg | INTRAMUSCULAR | Status: DC | PRN

## 2019-10-17 MED ORDER — AMLODIPINE BESYLATE 10 MG PO TABS
10.0000 mg | ORAL_TABLET | Freq: Every day | ORAL | Status: DC
  Administered 2019-10-17 – 2019-10-24 (×8): 10 mg via ORAL
  Filled 2019-10-17 (×5): qty 1
  Filled 2019-10-17: qty 2
  Filled 2019-10-17 (×3): qty 1

## 2019-10-17 MED ORDER — HYDROCODONE-ACETAMINOPHEN 5-325 MG PO TABS
1.0000 | ORAL_TABLET | Freq: Four times a day (QID) | ORAL | Status: DC | PRN
  Administered 2019-10-17 – 2019-10-24 (×13): 1 via ORAL
  Filled 2019-10-17 (×14): qty 1

## 2019-10-17 MED ORDER — LACTATED RINGERS IV SOLN: INTRAVENOUS | Status: DC

## 2019-10-17 MED ORDER — INSULIN ASPART 100 UNIT/ML ~~LOC~~ SOLN
0.0000 [IU] | Freq: Three times a day (TID) | SUBCUTANEOUS | Status: DC
  Administered 2019-10-18: 5 [IU] via SUBCUTANEOUS
  Administered 2019-10-18: 9 [IU] via SUBCUTANEOUS

## 2019-10-17 MED ORDER — ENOXAPARIN SODIUM 40 MG/0.4ML ~~LOC~~ SOLN
40.0000 mg | SUBCUTANEOUS | Status: DC
  Administered 2019-10-17 – 2019-10-23 (×7): 40 mg via SUBCUTANEOUS
  Filled 2019-10-17 (×7): qty 0.4

## 2019-10-17 MED ORDER — FENTANYL CITRATE (PF) 250 MCG/5ML IJ SOLN
INTRAMUSCULAR | Status: AC
  Filled 2019-10-17: qty 5

## 2019-10-17 MED ORDER — PHENYLEPHRINE 40 MCG/ML (10ML) SYRINGE FOR IV PUSH (FOR BLOOD PRESSURE SUPPORT)
PREFILLED_SYRINGE | INTRAVENOUS | Status: DC | PRN
  Administered 2019-10-17: 200 ug via INTRAVENOUS

## 2019-10-17 MED ORDER — FENTANYL CITRATE (PF) 100 MCG/2ML IJ SOLN: 25.0000 ug | INTRAMUSCULAR | Status: DC | PRN

## 2019-10-17 MED ORDER — MIDAZOLAM HCL 2 MG/2ML IJ SOLN
INTRAMUSCULAR | Status: AC
  Filled 2019-10-17: qty 2

## 2019-10-17 MED ORDER — ATENOLOL 50 MG PO TABS
100.0000 mg | ORAL_TABLET | Freq: Every day | ORAL | Status: DC
  Administered 2019-10-17 – 2019-10-24 (×8): 100 mg via ORAL
  Filled 2019-10-17 (×8): qty 2

## 2019-10-17 MED ORDER — HYDRALAZINE HCL 50 MG PO TABS
100.0000 mg | ORAL_TABLET | Freq: Three times a day (TID) | ORAL | Status: DC
  Administered 2019-10-17 – 2019-10-24 (×21): 100 mg via ORAL
  Filled 2019-10-17 (×22): qty 2

## 2019-10-17 MED ORDER — FENTANYL CITRATE (PF) 100 MCG/2ML IJ SOLN
INTRAMUSCULAR | Status: DC | PRN
  Administered 2019-10-17: 50 ug via INTRAVENOUS
  Administered 2019-10-17: 100 ug via INTRAVENOUS

## 2019-10-17 MED ORDER — HYDROMORPHONE HCL 1 MG/ML IJ SOLN
0.2500 mg | INTRAMUSCULAR | Status: DC | PRN
  Administered 2019-10-17 (×2): 0.25 mg via INTRAVENOUS

## 2019-10-17 MED ORDER — SODIUM CHLORIDE 0.9 % IV SOLN: INTRAVENOUS | Status: DC

## 2019-10-17 MED ORDER — HYDROMORPHONE HCL 1 MG/ML IJ SOLN
INTRAMUSCULAR | Status: AC
  Filled 2019-10-17: qty 1

## 2019-10-17 MED ORDER — ROCURONIUM BROMIDE 50 MG/5ML IV SOSY
PREFILLED_SYRINGE | INTRAVENOUS | Status: DC | PRN
  Administered 2019-10-17: 70 mg via INTRAVENOUS

## 2019-10-17 MED ORDER — CEFAZOLIN SODIUM-DEXTROSE 2-4 GM/100ML-% IV SOLN
INTRAVENOUS | Status: AC
  Filled 2019-10-17: qty 100

## 2019-10-17 MED ORDER — CEFAZOLIN SODIUM-DEXTROSE 2-3 GM-%(50ML) IV SOLR
INTRAVENOUS | Status: DC | PRN
  Administered 2019-10-17: 2 g via INTRAVENOUS

## 2019-10-17 MED ORDER — POTASSIUM CHLORIDE 10 MEQ/100ML IV SOLN
10.0000 meq | INTRAVENOUS | Status: AC
  Administered 2019-10-17 (×2): 10 meq via INTRAVENOUS
  Filled 2019-10-17 (×2): qty 100

## 2019-10-17 MED ORDER — ENSURE ENLIVE PO LIQD
237.0000 mL | Freq: Two times a day (BID) | ORAL | Status: DC
  Administered 2019-10-18 – 2019-10-22 (×10): 237 mL via ORAL

## 2019-10-17 MED ORDER — PROPOFOL 10 MG/ML IV BOLUS
INTRAVENOUS | Status: AC
  Filled 2019-10-17: qty 20

## 2019-10-17 MED ORDER — SENNOSIDES-DOCUSATE SODIUM 8.6-50 MG PO TABS
1.0000 | ORAL_TABLET | Freq: Every day | ORAL | Status: DC
  Administered 2019-10-17 – 2019-10-20 (×4): 1 via ORAL
  Filled 2019-10-17 (×4): qty 1

## 2019-10-17 MED ORDER — ONDANSETRON HCL 4 MG/2ML IJ SOLN: 4.0000 mg | Freq: Once | INTRAMUSCULAR | Status: DC | PRN

## 2019-10-17 MED ORDER — ONDANSETRON HCL 4 MG/2ML IJ SOLN
INTRAMUSCULAR | Status: AC
  Filled 2019-10-17: qty 2

## 2019-10-17 MED ORDER — PROPOFOL 10 MG/ML IV BOLUS
INTRAVENOUS | Status: DC | PRN
  Administered 2019-10-17: 130 mg via INTRAVENOUS

## 2019-10-17 MED ORDER — PHENYLEPHRINE 40 MCG/ML (10ML) SYRINGE FOR IV PUSH (FOR BLOOD PRESSURE SUPPORT)
PREFILLED_SYRINGE | INTRAVENOUS | Status: AC
  Filled 2019-10-17: qty 10

## 2019-10-17 MED ORDER — DEXAMETHASONE SODIUM PHOSPHATE 10 MG/ML IJ SOLN
INTRAMUSCULAR | Status: DC | PRN
  Administered 2019-10-17: 10 mg via INTRAVENOUS

## 2019-10-17 MED ORDER — 0.9 % SODIUM CHLORIDE (POUR BTL) OPTIME
TOPICAL | Status: DC | PRN
  Administered 2019-10-17: 1000 mL

## 2019-10-17 MED ORDER — MIDAZOLAM HCL 5 MG/5ML IJ SOLN
INTRAMUSCULAR | Status: DC | PRN
  Administered 2019-10-17: 2 mg via INTRAVENOUS

## 2019-10-17 MED ORDER — LIDOCAINE 2% (20 MG/ML) 5 ML SYRINGE
INTRAMUSCULAR | Status: DC | PRN
  Administered 2019-10-17: 100 mg via INTRAVENOUS

## 2019-10-17 MED ORDER — ATORVASTATIN CALCIUM 40 MG PO TABS
40.0000 mg | ORAL_TABLET | Freq: Every day | ORAL | Status: DC
  Administered 2019-10-17 – 2019-10-24 (×8): 40 mg via ORAL
  Filled 2019-10-17 (×8): qty 1

## 2019-10-17 MED ORDER — ONDANSETRON HCL 4 MG/2ML IJ SOLN
INTRAMUSCULAR | Status: DC | PRN
  Administered 2019-10-17: 4 mg via INTRAVENOUS

## 2019-10-17 MED ORDER — DEXAMETHASONE SODIUM PHOSPHATE 10 MG/ML IJ SOLN
INTRAMUSCULAR | Status: AC
  Filled 2019-10-17: qty 1

## 2019-10-17 MED ORDER — FENTANYL CITRATE (PF) 100 MCG/2ML IJ SOLN
25.0000 ug | INTRAMUSCULAR | Status: DC | PRN
  Administered 2019-10-18: 25 ug via INTRAVENOUS
  Filled 2019-10-17 (×2): qty 2

## 2019-10-17 MED ORDER — PANTOPRAZOLE SODIUM 40 MG PO TBEC
40.0000 mg | DELAYED_RELEASE_TABLET | Freq: Every day | ORAL | Status: DC
  Administered 2019-10-17 – 2019-10-24 (×8): 40 mg via ORAL
  Filled 2019-10-17 (×9): qty 1

## 2019-10-17 MED ORDER — CLONIDINE HCL 0.2 MG/24HR TD PTWK
0.2000 mg | MEDICATED_PATCH | TRANSDERMAL | Status: DC
  Administered 2019-10-19: 0.2 mg via TRANSDERMAL
  Filled 2019-10-17: qty 1

## 2019-10-17 MED ORDER — SUGAMMADEX SODIUM 200 MG/2ML IV SOLN
INTRAVENOUS | Status: DC | PRN
  Administered 2019-10-17: 300 mg via INTRAVENOUS

## 2019-10-17 SURGICAL SUPPLY — 38 items
ALCOHOL 70% 16 OZ (MISCELLANEOUS) ×3 IMPLANT
BIT DRILL FLUTED FEMUR 4.2/3 (BIT) ×3 IMPLANT
BNDG COHESIVE 6X5 TAN STRL LF (GAUZE/BANDAGES/DRESSINGS) ×9 IMPLANT
CANISTER SUCT 3000ML PPV (MISCELLANEOUS) ×3 IMPLANT
COVER PERINEAL POST (MISCELLANEOUS) ×3 IMPLANT
COVER SURGICAL LIGHT HANDLE (MISCELLANEOUS) ×3 IMPLANT
COVER WAND RF STERILE (DRAPES) IMPLANT
DRAPE HALF SHEET 40X57 (DRAPES) ×6 IMPLANT
DRAPE INCISE IOBAN 66X45 STRL (DRAPES) ×3 IMPLANT
DRAPE STERI IOBAN 125X83 (DRAPES) ×3 IMPLANT
DRSG ADAPTIC 3X8 NADH LF (GAUZE/BANDAGES/DRESSINGS) ×3 IMPLANT
DURAPREP 26ML APPLICATOR (WOUND CARE) ×3 IMPLANT
ELECT CAUTERY BLADE 6.4 (BLADE) IMPLANT
ELECT REM PT RETURN 9FT ADLT (ELECTROSURGICAL) ×3
ELECTRODE REM PT RTRN 9FT ADLT (ELECTROSURGICAL) ×1 IMPLANT
GAUZE SPONGE 4X4 12PLY STRL (GAUZE/BANDAGES/DRESSINGS) ×3 IMPLANT
GAUZE SPONGE 4X4 12PLY STRL LF (GAUZE/BANDAGES/DRESSINGS) IMPLANT
GLOVE BIO SURGEON STRL SZ7.5 (GLOVE) ×3 IMPLANT
GLOVE BIOGEL PI IND STRL 8 (GLOVE) ×1 IMPLANT
GLOVE BIOGEL PI INDICATOR 8 (GLOVE) ×2
GOWN STRL REUS W/ TWL LRG LVL3 (GOWN DISPOSABLE) ×1 IMPLANT
GOWN STRL REUS W/ TWL XL LVL3 (GOWN DISPOSABLE) ×1 IMPLANT
GOWN STRL REUS W/TWL LRG LVL3 (GOWN DISPOSABLE) ×2
GOWN STRL REUS W/TWL XL LVL3 (GOWN DISPOSABLE) ×2
GUIDEWIRE 3.2X400 (WIRE) ×6 IMPLANT
KIT BASIN OR (CUSTOM PROCEDURE TRAY) ×3 IMPLANT
KIT TURNOVER KIT B (KITS) ×3 IMPLANT
NAIL TROCH FIX 10X235 RT 130 (Nail) ×3 IMPLANT
NS IRRIG 1000ML POUR BTL (IV SOLUTION) ×3 IMPLANT
PACK GENERAL/GYN (CUSTOM PROCEDURE TRAY) ×3 IMPLANT
PAD ARMBOARD 7.5X6 YLW CONV (MISCELLANEOUS) ×6 IMPLANT
SCREW LOCKING 5.0X34MM (Screw) ×3 IMPLANT
SCREW TFNA 90MM HIP (Screw) ×3 IMPLANT
STAPLER VISISTAT 35W (STAPLE) ×3 IMPLANT
SUT MON AB 2-0 CT1 36 (SUTURE) ×3 IMPLANT
TOWEL GREEN STERILE (TOWEL DISPOSABLE) ×3 IMPLANT
TOWEL GREEN STERILE FF (TOWEL DISPOSABLE) ×3 IMPLANT
WATER STERILE IRR 1000ML POUR (IV SOLUTION) ×3 IMPLANT

## 2019-10-17 NOTE — Brief Op Note (Signed)
10/17/2019  4:23 PM  PATIENT:  Lauren Waller  63 y.o. female  PRE-OPERATIVE DIAGNOSIS:  right intertroch hip fracture  POST-OPERATIVE DIAGNOSIS:  right intertroch hip fracture  PROCEDURE:  Procedure(s): INTRAMEDULLARY (IM) NAIL INTERTROCHANTRIC (Right)  SURGEON:  Surgeon(s) and Role:    * Nicholes Stairs, MD - Primary  PHYSICIAN ASSISTANT:   ASSISTANTS: none   ANESTHESIA:   general  EBL:  150 mL   BLOOD ADMINISTERED:none  DRAINS: none   LOCAL MEDICATIONS USED:  NONE  SPECIMEN:  No Specimen  DISPOSITION OF SPECIMEN:  N/A  COUNTS:  YES  TOURNIQUET:  * No tourniquets in log *  DICTATION: .Note written in EPIC  PLAN OF CARE: Admit to inpatient   PATIENT DISPOSITION:  PACU - hemodynamically stable.   Delay start of Pharmacological VTE agent (>24hrs) due to surgical blood loss or risk of bleeding: not applicable

## 2019-10-17 NOTE — ED Notes (Signed)
ED TO INPATIENT HANDOFF REPORT  ED Nurse Name and Phone #: Percival Spanish [phone]  S Name/Age/Gender Felicity Pellegrini 63 y.o. female Room/Bed: 033C/033C  Code Status   Code Status: Full Code  Home/SNF/Other Nursing Home Patient oriented to: self, place, time and situation Is this baseline? Yes   Triage Complete: Triage complete  Chief Complaint Intertrochanteric fracture of right femur [facility]) [S72.141A]  Triage Note Pt bib [facility] from [facility]. Per staff pt fell on 12/3 and then fell again on 12/18. Healthcare center completed x-rays on 12/23 after pt c/o R leg pain and noted pt has R femur fx. Pt received fetanyl with EMS. Pt alert and at neuro baseline. 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 EMS VSS    Allergies Allergies  Allergen Reactions  . Januvia [Sitagliptin] Swelling  . Metformin And Related Nausea And Vomiting    Level of Care/Admitting Diagnosis ED Disposition    ED Disposition Condition Comment   Admit  Hospital Area: [facility] [[id number]]  Level of Care: Telemetry Surgical [[id number]]  Covid Evaluation: Asymptomatic Screening Protocol (No Symptoms)  Diagnosis: Intertrochanteric fracture of right femur [facility]) [[id number]]  Admitting Physician: Clydie Braun [[id number]]  Attending Physician: Clydie Braun [[id number]]  Estimated length of stay: past midnight tomorrow  Certification:: I certify this patient will need inpatient services for at least 2 midnights       B Medical/Surgery History Past Medical History:  Diagnosis Date  . Chronic lower back pain   . Depression   . Facial cellulitis 04/17/2018  . HLD (hyperlipidemia)   . Hypertension   . Type II diabetes mellitus (HCC)    Past Surgical History:  Procedure Laterality Date  . BUBBLE STUDY  08/06/2019   Procedure: BUBBLE STUDY;  Surgeon: Chrystie Nose, MD;  Location: [facility] ENDOSCOPY;  Service: Cardiovascular;;  . CATARACT EXTRACTION W/  INTRAOCULAR LENS  IMPLANT, BILATERAL Bilateral   . EYE SURGERY Bilateral    2016  . fatty tissue removal from upper back     1998  . INGUINAL HERNIA REPAIR Left   . MYOMECTOMY    . ROBOTIC ASSISTED LAPAROSCOPIC VAGINAL HYSTERECTOMY WITH FIBROID REMOVAL     2009  . TEE WITHOUT CARDIOVERSION N/A 08/06/2019   Procedure: TRANSESOPHAGEAL ECHOCARDIOGRAM (TEE);  Surgeon: Chrystie Nose, MD;  Location: [facility] ENDOSCOPY;  Service: Cardiovascular;  Laterality: N/A;     A IV Location/Drains/Wounds Patient Lines/Drains/Airways Status   Active Line/Drains/Airways    Name:   Placement date:   Placement time:   Site:   Days:   Peripheral IV 10/17/19 Right Wrist   10/17/19    --    Wrist   less than 1   External Urinary Catheter   10/17/19    1147    --   less than 1          Intake/Output Last 24 hours  Intake/Output Summary (Last 24 hours) at 10/17/2019 1219 Last data filed at 10/17/2019 1132 Gross per 24 hour  Intake 100 ml  Output --  Net 100 ml    Labs/Imaging Results for orders placed or performed during the hospital encounter of 10/17/19 (from the past 48 hour(s))  Type and screen [facility]     Status: None   Collection Time: 10/17/19  3:20 AM  Result Value Ref Range   ABO/RH(D) O POS    Antibody Screen NEG    Sample Expiration  10/20/2019,2359 Performed at [facility], [address] [place], [place] [address]   ABO/Rh     Status: None   Collection Time: 10/17/19  3:20 AM  Result Value Ref Range   ABO/RH(D)      O POS Performed at [facility], [address] [place], [place] [address]   SARS CORONAVIRUS 2 (TAT 6-24 HRS) Nasopharyngeal Nasopharyngeal Swab     Status: None   Collection Time: 10/17/19  3:24 AM   Specimen: Nasopharyngeal Swab  Result Value Ref Range   SARS Coronavirus 2 NEGATIVE NEGATIVE    Comment: (NOTE) SARS-CoV-2 target nucleic acids are NOT DETECTED. The SARS-CoV-2 RNA is generally detectable in upper and  lower respiratory specimens during the acute phase of infection. Negative results do not preclude SARS-CoV-2 infection, do not rule out co-infections with other pathogens, and should not be used as the sole basis for treatment or other patient management decisions. Negative results must be combined with clinical observations, patient history, and epidemiological information. The expected result is Negative. Fact Sheet for Patients: HairSlick.no Fact Sheet for Healthcare Providers: quierodirigir.com This test is not yet approved or cleared by the [place] FDA and  has been authorized for detection and/or diagnosis of SARS-CoV-2 by FDA under an Emergency Use Authorization (EUA). This EUA will remain  in effect (meaning this test can be used) for the duration of the COVID-19 declaration under Section 56 4(b)(1) of the Act, 21 U.S.C. section 360bbb-3(b)(1), unless the authorization is terminated or revoked sooner. Performed at [facility], [address], [place], [place] [address]   CBC WITH DIFFERENTIAL     Status: Abnormal   Collection Time: 10/17/19  3:39 AM  Result Value Ref Range   WBC 9.4 4.0 - 10.5 K/uL   RBC 3.11 (L) 3.87 - 5.11 MIL/uL   Hemoglobin 8.6 (L) 12.0 - 15.0 g/dL   HCT 91.4 (L) 78.2 - 95.6 %   MCV 88.1 80.0 - 100.0 fL   MCH 27.7 26.0 - 34.0 pg   MCHC 31.4 30.0 - 36.0 g/dL   RDW 21.3 08.6 - 57.8 %   Platelets 325 150 - 400 K/uL   nRBC 0.0 0.0 - 0.2 %   Neutrophils Relative % 74 %   Neutro Abs 6.9 1.7 - 7.7 K/uL   Lymphocytes Relative 18 %   Lymphs Abs 1.7 0.7 - 4.0 K/uL   Monocytes Relative 6 %   Monocytes Absolute 0.6 0.1 - 1.0 K/uL   Eosinophils Relative 2 %   Eosinophils Absolute 0.1 0.0 - 0.5 K/uL   Basophils Relative 0 %   Basophils Absolute 0.0 0.0 - 0.1 K/uL   Immature Granulocytes 0 %   Abs Immature Granulocytes 0.04 0.00 - 0.07 K/uL    Comment: Performed at [facility],  [address], [place], [place] [address]  Protime-INR     Status: None   Collection Time: 10/17/19  3:39 AM  Result Value Ref Range   Prothrombin Time 13.2 11.4 - 15.2 seconds   INR 1.0 0.8 - 1.2    Comment: (NOTE) INR goal varies based on device and disease states. Performed at [facility], [address], [place], [place] [address]   Comprehensive metabolic panel     Status: Abnormal   Collection Time: 10/17/19  3:39 AM  Result Value Ref Range   Sodium 140 135 - 145 mmol/L   Potassium 3.4 (L) 3.5 - 5.1 mmol/L  Chloride 101 98 - 111 mmol/L   CO2 29 22 - 32 mmol/L   Glucose, Bld 138 (H) 70 - 99 mg/dL   BUN 23 8 - 23 mg/dL   Creatinine, Ser 0.95 0.44 - 1.00 mg/dL   Calcium 9.2 8.9 - 10.3 mg/dL   Total Protein 6.4 (L) 6.5 - 8.1 g/dL   Albumin 2.8 (L) 3.5 - 5.0 g/dL   AST 27 15 - 41 U/L   ALT 23 0 - 44 U/L   Alkaline Phosphatase 74 38 - 126 U/L   Total Bilirubin 0.9 0.3 - 1.2 mg/dL   GFR calc non Af Amer >60 >60 mL/min   GFR calc Af Amer >60 >60 mL/min   Anion gap 10 5 - 15    Comment: Performed at [facility] [address], [place], [place] [address]  POC occult blood, ED     Status: None   Collection Time: 10/17/19  7:06 AM  Result Value Ref Range   Fecal Occult Bld NEGATIVE NEGATIVE  Hemoglobin A1c     Status: Abnormal   Collection Time: 10/17/19  7:30 AM  Result Value Ref Range   Hgb A1c MFr Bld 8.2 (H) 4.8 - 5.6 %    Comment: (NOTE) Pre diabetes:          5.7%-6.4% Diabetes:              >6.4% Glycemic control for   <7.0% adults with diabetes    Mean Plasma Glucose 188.64 mg/dL    Comment: Performed at [facility] [address], [place], [place] [address]  CBG monitoring, ED     Status: Abnormal   Collection Time: 10/17/19  7:52 AM  Result Value Ref Range   Glucose-Capillary 118 (H) 70 - 99 mg/dL  CBG monitoring, ED     Status: Abnormal   Collection Time: 10/17/19 11:35 AM  Result Value Ref Range   Glucose-Capillary 121 (H) 70 - 99 mg/dL    DG Chest 1 View  Result Date: 10/17/2019 CLINICAL DATA:  63 year old female status post fall with hip deformity. EXAM: CHEST  1 VIEW COMPARISON:  Portable chest 08/02/2019. FINDINGS: AP view at 0538 hours. Stable lung volumes and mediastinal contours. Cardiac size at the upper limits of normal. Visualized tracheal air column is within normal limits. Skin fold artifact in the right lung. No pneumothorax identified. Allowing for portable technique the lungs are clear. Negative visible bowel gas pattern. Nipple shadows in the upper abdomen. No acute osseous abnormality identified. IMPRESSION: No acute cardiopulmonary abnormality. Electronically Signed   By: Genevie Ann M.D.   On: 10/17/2019 06:13   CT HEAD WO CONTRAST  Result Date: 10/17/2019 CLINICAL DATA:  63 year old female status post fall with right femur fracture. EXAM: CT HEAD WITHOUT CONTRAST TECHNIQUE: Contiguous axial images were obtained from the base of the skull through the vertex without intravenous contrast. COMPARISON:  Brain MRI 08/02/2019. FINDINGS: Brain: Sequelae of left cerebellar infarct seen in October with encephalomalacia now. Superimposed chronic lacunar infarcts in the deep gray nuclei including the right thalamus. Stable cerebral volume. No midline shift, ventriculomegaly, mass effect, evidence of mass lesion, intracranial hemorrhage or evidence of cortically based acute infarction. Vascular: Mild Calcified atherosclerosis at the skull base. No suspicious intracranial vascular hyperdensity. Skull: Negative. Sinuses/Orbits: Mild maxillary and ethmoid sinus mucosal thickening is new since October. Tympanic cavities and mastoids remain clear. Other: No acute orbit or scalp soft tissue findings. IMPRESSION: 1. No acute traumatic injury or  acute intracranial abnormality identified. 2. 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 Underlying chronic small vessel disease. Electronically Signed   By: Odessa Fleming M.D.   On:  10/17/2019 06:21   CT CERVICAL SPINE WO CONTRAST  Result Date: 10/17/2019 CLINICAL DATA:  63 year old female status post fall with right femur fracture. EXAM: CT CERVICAL SPINE WITHOUT CONTRAST TECHNIQUE: Multidetector CT imaging of the cervical spine was performed without intravenous contrast. Multiplanar CT image reconstructions were also generated. COMPARISON:  08/02/2019. FINDINGS: Alignment: Straightening of cervical lordosis has not significantly changed. Cervicothoracic junction alignment is within normal limits. Bilateral posterior element alignment is within normal limits. Skull base and vertebrae: Visualized skull base is intact. No atlanto-occipital dissociation. No acute osseous abnormality identified. A small round lucency in the posterior C3 vertebral body is stable on series 7, image 33 and likely benign. Soft tissues and spinal canal: No prevertebral fluid or swelling. No visible canal hematoma. 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 This contributes to multifactorial widespread cervical spinal stenosis which is moderate at multiple levels, and possibly severe at C5-C6. Superimposed bulky cervical endplate spurring. No cervical ankylosis, although there does appear to be bridging bone at the T1-T2 level. Upper chest: Visible upper thoracic levels appear intact. Negative lung apices. IMPRESSION: 1. No acute traumatic injury identified in the cervical spine. 2. 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 If there are myelopathic symptoms note that this degree of stenosis could predispose to spinal cord injury in a fall. Electronically Signed   By: Odessa Fleming M.D.   On: 10/17/2019 06:26   DG Hip Unilat With Pelvis 2-3 Views Right  Result Date: 10/17/2019 CLINICAL DATA:  63 year old female  status post fall with hip deformity. EXAM: DG HIP (WITH OR WITHOUT PELVIS) 2-3V RIGHT COMPARISON:  None. FINDINGS: Comminuted intertrochanteric fracture of the proximal right femur with mild varus impaction. The right femoral head remains normally located. No superimposed acute pelvis fracture identified. Grossly intact proximal left femur. Previous left inguinal region hernia repair with mesh. Negative visible bowel gas pattern. IMPRESSION: 1. Right femur comminuted intertrochanteric fracture with mild varus impaction. 2. No superimposed pelvis fracture identified. Electronically Signed   By: Odessa Fleming M.D.   On: 10/17/2019 06:14    Pending Labs Unresulted Labs (From admission, onward)    Start     Ordered   10/18/19 0500  CBC  Tomorrow morning,   R     10/17/19 0736   10/18/19 0500  Basic metabolic panel  Tomorrow morning,   R     10/17/19 0736   10/18/19 0500  Prealbumin  Tomorrow morning,   R     10/17/19 0736   10/17/19 0258  Urinalysis, Routine w reflex microscopic  ONCE - STAT,   STAT     10/17/19 0258          Vitals/Pain Today's Vitals   10/17/19 1015 10/17/19 1107 10/17/19 1115 10/17/19 1200  BP:  (!) 166/81 (!) 171/87 (!) 193/87  Pulse:  74 67 67  Resp:  13 (!) 9 12  Temp:      TempSrc:      SpO2:  100% 100% 100%  PainSc: Asleep       Isolation Precautions No active isolations  Medications Medications  0.9 %  sodium chloride infusion ( Intravenous  New Bag/Given 10/17/19 0749)  senna-docusate (Senokot-S) tablet 1 tablet (has no administration in time range)  fentaNYL (SUBLIMAZE) injection 25 mcg (has no administration in time range)  HYDROcodone-acetaminophen (NORCO/VICODIN) 5-325 MG per tablet 1 tablet (has no administration in time range)  enoxaparin (LOVENOX) injection 40 mg (has no administration in time range)  insulin aspart (novoLOG) injection 0-9 Units (0 Units Subcutaneous Not Given 10/17/19 1136)  potassium chloride 10 mEq in 100 mL IVPB (10 mEq Intravenous  New Bag/Given 10/17/19 1134)    Mobility walks with device High fall risk   Focused Assessments    R Recommendations: See Admitting Provider Note  Report given to:   Additional Notes:

## 2019-10-17 NOTE — Anesthesia Procedure Notes (Signed)

## 2019-10-17 NOTE — Anesthesia Postprocedure Evaluation (Signed)
Anesthesia Post Note  Patient: Lauren Waller  Procedure(s) Performed: INTRAMEDULLARY (IM) NAIL INTERTROCHANTRIC (Right Leg Upper)     Patient location during evaluation: PACU Anesthesia Type: General Level of consciousness: awake and alert Pain management: pain level controlled Vital Signs Assessment: post-procedure vital signs reviewed and stable Respiratory status: spontaneous breathing, nonlabored ventilation, respiratory function stable and patient connected to nasal cannula oxygen Cardiovascular status: blood pressure returned to baseline and stable Postop Assessment: no apparent nausea or vomiting Anesthetic complications: no    Last Vitals:  Vitals:   10/17/19 1251 10/17/19 1630  BP: (!) 183/79   Pulse: 76   Resp: 18   Temp: 36.9 C 36.5 C  SpO2: 100%     Last Pain:  Vitals:   10/17/19 1630  TempSrc:   PainSc: [place] DAVID

## 2019-10-17 NOTE — Consult Note (Signed)
ORTHOPAEDIC CONSULTATION  REQUESTING PHYSICIAN: Clydie Braun, MD  PCP:  Jackie Plum, MD  Chief Complaint: Right hip fracture  HPI: Lauren Waller is a 63 y.o. female who complains of right hip pain following a ground-level fall at her nursing facility.  She does have a history of diabetes as well as cerebrovascular accident and has left her with some mental decline.  She is able to consent for herself, but does have some delayed response and processing issues.  Otherwise she walks with a walker at baseline.  She has no history of previous right hip surgery or injuries that she knows of.  Past Medical History:  Diagnosis Date  . Chronic lower back pain   . Depression   . Facial cellulitis 04/17/2018  . HLD (hyperlipidemia)   . Hypertension   . Type II diabetes mellitus (HCC)    Past Surgical History:  Procedure Laterality Date  . BUBBLE STUDY  08/06/2019   Procedure: BUBBLE STUDY;  Surgeon: Chrystie Nose, MD;  Location: [facility] ENDOSCOPY;  Service: Cardiovascular;;  . CATARACT EXTRACTION W/ INTRAOCULAR LENS  IMPLANT, BILATERAL Bilateral   . EYE SURGERY Bilateral    2016  . fatty tissue removal from upper back     1998  . INGUINAL HERNIA REPAIR Left   . MYOMECTOMY    . ROBOTIC ASSISTED LAPAROSCOPIC VAGINAL HYSTERECTOMY WITH FIBROID REMOVAL     2009  . TEE WITHOUT CARDIOVERSION N/A 08/06/2019   Procedure: TRANSESOPHAGEAL ECHOCARDIOGRAM (TEE);  Surgeon: Chrystie Nose, MD;  Location: [facility] ENDOSCOPY;  Service: Cardiovascular;  Laterality: N/A;   Social History   Socioeconomic History  . Marital status: Divorced    Spouse name: Not on file  . Number of children: Not on file  . Years of education: Not on file  . Highest education level: Not on file  Occupational History  . Occupation: unemployed  Tobacco Use  . Smoking status: Never Smoker  . Smokeless tobacco: Never Used  Substance and Sexual Activity  . Alcohol use: Not Currently  . Drug use: Never  .  Sexual activity: Not on file  Other Topics Concern  . Not on file  Social History Narrative  . Not on file   Social Determinants of Health   Financial Resource Strain:   . Difficulty of Paying Living Expenses: Not on file  Food Insecurity:   . Worried About Programme researcher, broadcasting/film/video in the Last Year: Not on file  . Ran Out of Food in the Last Year: Not on file  Transportation Needs:   . Lack of Transportation (Medical): Not on file  . Lack of Transportation (Non-Medical): Not on file  Physical Activity:   . Days of Exercise per Week: Not on file  . Minutes of Exercise per Session: Not on file  Stress:   . Feeling of Stress : Not on file  Social Connections:   . Frequency of Communication with Friends and Family: Not on file  . Frequency of Social Gatherings with Friends and Family: Not on file  . Attends Religious Services: Not on file  . Active Member of Clubs or Organizations: Not on file  . Attends Banker Meetings: Not on file  . Marital Status: Not on file   Family History  Problem Relation Age of Onset  . Hypertension Mother   . Pancreatic cancer Brother   . Stroke Neg Hx    Allergies  Allergen Reactions  . Januvia [Sitagliptin] Swelling  . Metformin And Related  Nausea And Vomiting   Prior to Admission medications   Medication Sig Start Date End Date Taking? Authorizing Provider  amLODipine (NORVASC) 10 MG tablet Take 10 mg by mouth daily. 03/23/18  Yes [provider]  aspirin EC 81 MG EC tablet Take 1 tablet (81 mg total) by mouth daily. 08/11/19  Yes Dana Allan I, MD  atenolol (TENORMIN) 100 MG tablet Take 1 tablet by mouth daily 12/20/18  Yes Patwardhan, Manish J, MD  atorvastatin (LIPITOR) 40 MG tablet Take 1 tablet (40 mg total) by mouth daily. 08/11/19  Yes Bonnell Public, MD  B Complex-C (B-COMPLEX WITH VITAMIN C) tablet Take 1 tablet by mouth daily. 08/11/19  Yes Bonnell Public, MD  bisacodyl (DULCOLAX) 10 MG suppository  Place 10 mg rectally daily as needed for moderate constipation.   Yes [provider]  cloNIDine (CATAPRES - DOSED IN MG/24 HR) 0.2 mg/24hr patch Place 0.2 mg onto the skin once a week. Saturday   Yes [provider]  clopidogrel (PLAVIX) 75 MG tablet Take 1 tablet (75 mg total) by mouth daily. 08/11/19  Yes Dana Allan I, MD  glipiZIDE (GLUCOTROL XL) 2.5 MG 24 hr tablet Take 1 tablet (2.5 mg total) by mouth daily with breakfast. 08/11/19  Yes Ogbata, Babs Bertin, MD  Glucerna (GLUCERNA) LIQD Take 237 mLs by mouth 2 (two) times daily between meals.   Yes [provider]  hydrALAZINE (APRESOLINE) 100 MG tablet Take 1 tablet (100 mg total) by mouth every 8 (eight) hours. 08/10/19  Yes Dana Allan I, MD  insulin aspart (NOVOLOG) 100 UNIT/ML injection Inject 2-10 Units into the skin 4 (four) times daily -  before meals and at bedtime. Sliding scale   Yes [provider]  insulin glargine (LANTUS) 100 UNIT/ML injection Inject 0.2 mLs (20 Units total) into the skin daily. Patient taking differently: Inject 24 Units into the skin at bedtime.  08/11/19  Yes Bonnell Public, MD  NON FORMULARY Take 120 mLs by mouth 2 (two) times daily. Medpass   Yes [provider]  ondansetron (ZOFRAN) 4 MG tablet Take 4 mg by mouth every 8 (eight) hours.   Yes [provider]  ondansetron (ZOFRAN) 4 MG tablet Take 4 mg by mouth every 8 (eight) hours as needed for nausea or vomiting.   Yes [provider]  pantoprazole (PROTONIX) 20 MG tablet Take 40 mg by mouth daily.   Yes [provider]  senna-docusate (SENOKOT-S) 8.6-50 MG tablet Take 1 tablet by mouth at bedtime as needed for mild constipation. 08/10/19  Yes Bonnell Public, MD  traMADol (ULTRAM) 50 MG tablet Take 50 mg by mouth every 6 (six) hours as needed for moderate pain.   Yes [provider]  cloNIDine (CATAPRES) 0.1 MG tablet Take 1 tablet (0.1 mg total) by mouth  2 (two) times daily. Patient not taking: Reported on 10/17/2019 08/10/19   Bonnell Public, MD   DG Chest 1 View  Result Date: 10/17/2019 CLINICAL DATA:  63 year old female status post fall with hip deformity. EXAM: CHEST  1 VIEW COMPARISON:  Portable chest 08/02/2019. FINDINGS: AP view at 0538 hours. Stable lung volumes and mediastinal contours. Cardiac size at the upper limits of normal. Visualized tracheal air column is within normal limits. Skin fold artifact in the right lung. No pneumothorax identified. Allowing for portable technique the lungs are clear. Negative visible bowel gas pattern. Nipple shadows in the upper abdomen. No acute osseous abnormality identified. IMPRESSION: No acute  cardiopulmonary abnormality. Electronically Signed   By: Odessa FlemingH  Hall M.D.   On: 10/17/2019 06:13   CT HEAD WO CONTRAST  Result Date: 10/17/2019 CLINICAL DATA:  63 year old female status post fall with right femur fracture. EXAM: CT HEAD WITHOUT CONTRAST TECHNIQUE: Contiguous axial images were obtained from the base of the skull through the vertex without intravenous contrast. COMPARISON:  Brain MRI 08/02/2019. FINDINGS: Brain: Sequelae of left cerebellar infarct seen in October with encephalomalacia now. Superimposed chronic lacunar infarcts in the deep gray nuclei including the right thalamus. Stable cerebral volume. No midline shift, ventriculomegaly, mass effect, evidence of mass lesion, intracranial hemorrhage or evidence of cortically based acute infarction. Vascular: Mild Calcified atherosclerosis at the skull base. No suspicious intracranial vascular hyperdensity. Skull: Negative. Sinuses/Orbits: Mild maxillary and ethmoid sinus mucosal thickening is new since October. Tympanic cavities and mastoids remain clear. Other: No acute orbit or scalp soft tissue findings. IMPRESSION: 1. No acute traumatic injury or acute intracranial abnormality identified. 2. Expected evolution of the Left SCA cerebellum infarct in  October. Underlying chronic small vessel disease. Electronically Signed   By: Odessa FlemingH  Hall M.D.   On: 10/17/2019 06:21   CT CERVICAL SPINE WO CONTRAST  Result Date: 10/17/2019 CLINICAL DATA:  63 year old female status post fall with right femur fracture. EXAM: CT CERVICAL SPINE WITHOUT CONTRAST TECHNIQUE: Multidetector CT imaging of the cervical spine was performed without intravenous contrast. Multiplanar CT image reconstructions were also generated. COMPARISON:  08/02/2019. FINDINGS: Alignment: Straightening of cervical lordosis has not significantly changed. Cervicothoracic junction alignment is within normal limits. Bilateral posterior element alignment is within normal limits. Skull base and vertebrae: Visualized skull base is intact. No atlanto-occipital dissociation. No acute osseous abnormality identified. A small round lucency in the posterior C3 vertebral body is stable on series 7, image 33 and likely benign. Soft tissues and spinal canal: No prevertebral fluid or swelling. No visible canal hematoma. 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 This contributes to multifactorial widespread cervical spinal stenosis which is moderate at multiple levels, and possibly severe at C5-C6. Superimposed bulky cervical endplate spurring. No cervical ankylosis, although there does appear to be bridging bone at the T1-T2 level. Upper chest: Visible upper thoracic levels appear intact. Negative lung apices. IMPRESSION: 1. No acute traumatic injury identified in the cervical spine. 2. 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 If there are myelopathic symptoms note that this degree of stenosis could predispose to spinal cord injury in a fall. Electronically Signed   By: Odessa FlemingH  Hall M.D.   On: 10/17/2019 06:26   DG Hip  Unilat With Pelvis 2-3 Views Right  Result Date: 10/17/2019 CLINICAL DATA:  63 year old female status post fall with hip deformity. EXAM: DG HIP (WITH OR WITHOUT PELVIS) 2-3V RIGHT COMPARISON:  None. FINDINGS: Comminuted intertrochanteric fracture of the proximal right femur with mild varus impaction. The right femoral head remains normally located. No superimposed acute pelvis fracture identified. Grossly intact proximal left femur. Previous left inguinal region hernia repair with mesh. Negative visible bowel gas pattern. IMPRESSION: 1. Right femur comminuted intertrochanteric fracture with mild varus impaction. 2. No superimposed pelvis fracture identified. Electronically Signed   By: Odessa FlemingH  Hall M.D.   On: 10/17/2019 06:14    Positive ROS: All other systems have been reviewed and were otherwise negative with the exception of those mentioned in the  HPI and as above.  Physical Exam: General: Alert, no acute distress Cardiovascular: No pedal edema Respiratory: No cyanosis, no use of accessory musculature GI: No organomegaly, abdomen is soft and non-tender Skin: No lesions in the area of chief complaint Neurologic: Sensation intact distally Psychiatric: Patient is competent for consent with normal mood and affect Lymphatic: No axillary or cervical lymphadenopathy  MUSCULOSKELETAL:  Right lower extremity is slightly externally rotated and shortened.  She is neurovascular intact distally with good 2+ pulses.  Assessment: Right hip intertrochanteric fracture  Plan: -Our plan is for intramedullary nail to the right hip for intertrochanteric fracture.  She will be weightbearing as tolerated postoperatively.  - The risks, benefits, and alternatives were discussed with the patient. There are risks associated with the surgery including, but not limited to, problems with anesthesia (death), infection, differences in leg length/angulation/rotation, fracture of bones, loosening or failure of implants,  malunion, nonunion, hematoma (blood accumulation) which may require surgical drainage, blood clots, pulmonary embolism, nerve injury (foot drop), and blood vessel injury. The patient understands these risks and elects to proceed.  -She will return to the floor under the medicine service.  I appreciate their excellent care during this hospitalization.    Yolonda Kida, MD Cell [phone]    10/17/2019 2:07 PM

## 2019-10-17 NOTE — ED Notes (Signed)
Cleaned pt's perineal area and applied clean brief to pt.

## 2019-10-17 NOTE — Transfer of Care (Signed)
Immediate Anesthesia Transfer of Care Note  Patient: Lauren Waller  Procedure(s) Performed: INTRAMEDULLARY (IM) NAIL INTERTROCHANTRIC (Right Leg Upper)  Patient Location: PACU  Anesthesia Type:General  Level of Consciousness: drowsy and patient cooperative  Airway & Oxygen Therapy: Patient Spontanous Breathing  Post-op Assessment: Report given to RN and Post -op Vital signs reviewed and stable  Post vital signs: Reviewed and stable  Last Vitals:  Vitals Value Taken Time  BP 155/71 10/17/19 1631  Temp    Pulse 65 10/17/19 1633  Resp 20 10/17/19 1633  SpO2 92 % 10/17/19 1633  Vitals shown include unvalidated device data.  Last Pain:  Vitals:   10/17/19 1251  TempSrc: Oral  PainSc: 10-Worst pain ever      Patients Stated Pain Goal: 3 (35/00/93 8182)  Complications: No apparent anesthesia complications

## 2019-10-17 NOTE — ED Notes (Signed)
Pt declined pain meds at this time.

## 2019-10-17 NOTE — Progress Notes (Signed)
Initial Nutrition Assessment  DOCUMENTATION CODES:   Not applicable  INTERVENTION:   Ensure Enlive po BID, each supplement provides 350 kcal and 20 grams of protein   NUTRITION DIAGNOSIS:   Increased nutrient needs related to post-op healing as evidenced by estimated needs.  GOAL:   Patient will meet greater than or equal to 90% of their needs   MONITOR:   PO intake, Supplement acceptance, Labs, Weight trends  REASON FOR ASSESSMENT:   Consult Assessment of nutrition requirement/status  ASSESSMENT:   63 yo female admitted post fall with right femur fracture. PMH includes DM, HTN, CVA   Pt currently out of room for procedure. NPO for procedure as well  Unable to obtain diet and weight history  Labs: HgbA1c 8.2 Meds: NS at 75 ml/hr   Diet Order:   Diet Order            Diet NPO time specified  Diet effective now              EDUCATION NEEDS:   Not appropriate for education at this time  Skin:  Skin Assessment: Reviewed RN Assessment  Last BM:  no documented BM  Height:   Ht Readings from Last 1 Encounters:  10/17/19 5\' 4"  (1.626 m)    Weight:   Wt Readings from Last 1 Encounters:  08/08/19 77 kg    BMI:  Body mass index is 29.14 kg/m.  Estimated Nutritional Needs:   Kcal:  1700-1900 kcals  Protein:  85-95 g  Fluid:  >/= 1.7    [employer] MS, RDN, LDN, CNSC [phone] Pager  [phone] Weekend/On-Call Pager

## 2019-10-17 NOTE — ED Provider Notes (Signed)
[facility] EMERGENCY DEPARTMENT Provider Note   CSN: [id number] Arrival date & time: 10/17/19  0204     History Chief Complaint  Patient presents with  . Leg Pain    Lauren Waller is a 63 y.o. female with a hx of diabetes, hypertension, CVA presents to the Emergency Department complaining of unknown onset right hip deformity after fall.  Patient reports she has fallen several times.  She reports she normally walks with a walker.  She is unable to provide much information  Per EMS, staff reports patient fell on 12 3 and again on 1218.  X-rays completed on 12/23 after patient began to complain of right leg pain noted a right femur fracture.  Patient received 50 mcg of fentanyl for EMS.  Level 5 caveat for limited communication due to CVA.   The history is provided by the patient, medical records and a caregiver. The history is limited by the condition of the patient. No language interpreter was used.       Past Medical History:  Diagnosis Date  . Chronic lower back pain   . Depression   . Facial cellulitis 04/17/2018  . HLD (hyperlipidemia)   . Hypertension   . Type II diabetes mellitus [facility])     Patient Active Problem List   Diagnosis Date Noted  . 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 HLD (hyperlipidemia) 04/17/2018  . Hypertensive urgency 04/17/2018  . Facial cellulitis 04/17/2018  . Sepsis (HCC) 04/17/2018  . Diabetes mellitus without complication (HCC)   . Hypertension   . Varicose veins of bilateral lower extremities with other complications 08/29/2017    Past Surgical History:  Procedure Laterality Date  . BUBBLE STUDY  08/06/2019   Procedure: BUBBLE STUDY;  Surgeon: Chrystie Nose, MD;  Location: [facility] ENDOSCOPY;  Service: Cardiovascular;;  . CATARACT EXTRACTION W/ INTRAOCULAR LENS  IMPLANT, BILATERAL Bilateral   . EYE SURGERY Bilateral    2016  . fatty tissue removal from upper back     1998   . INGUINAL HERNIA REPAIR Left   . MYOMECTOMY    . ROBOTIC ASSISTED LAPAROSCOPIC VAGINAL HYSTERECTOMY WITH FIBROID REMOVAL     2009  . TEE WITHOUT CARDIOVERSION N/A 08/06/2019   Procedure: TRANSESOPHAGEAL ECHOCARDIOGRAM (TEE);  Surgeon: Chrystie Nose, MD;  Location: [facility] ENDOSCOPY;  Service: Cardiovascular;  Laterality: N/A;     OB History   No obstetric history on file.     Family History  Problem Relation Age of Onset  . Hypertension Mother   . Pancreatic cancer Brother   . Stroke Neg Hx     Social History   Tobacco Use  . Smoking status: Never Smoker  . Smokeless tobacco: Never Used  Substance Use Topics  . Alcohol use: Not Currently  . Drug use: Never    Home Medications Prior to Admission medications   Medication Sig Start Date End Date Taking? Authorizing Provider  amLODipine (NORVASC) 10 MG tablet Take 10 mg by mouth daily. 03/23/18   [provider]  aspirin EC 81 MG EC tablet Take 1 tablet (81 mg total) by mouth daily. 08/11/19   Berton Mount I, MD  atenolol (TENORMIN) 100 MG tablet Take 1 tablet by mouth daily 12/20/18   Patwardhan, Anabel Bene, MD  atorvastatin (LIPITOR) 40 MG tablet Take 1 tablet (40 mg total) by mouth daily. 08/11/19   Barnetta Chapel, MD  B Complex-C (B-COMPLEX WITH VITAMIN C) tablet Take 1 tablet by mouth  daily. 08/11/19   Dana Allan I, MD  cloNIDine (CATAPRES) 0.1 MG tablet Take 1 tablet (0.1 mg total) by mouth 2 (two) times daily. 08/10/19   Dana Allan I, MD  clopidogrel (PLAVIX) 75 MG tablet Take 1 tablet (75 mg total) by mouth daily. 08/11/19   Bonnell Public, MD  glipiZIDE (GLUCOTROL XL) 2.5 MG 24 hr tablet Take 1 tablet (2.5 mg total) by mouth daily with breakfast. 08/11/19   Bonnell Public, MD  hydrALAZINE (APRESOLINE) 100 MG tablet Take 1 tablet (100 mg total) by mouth every 8 (eight) hours. 08/10/19   Dana Allan I, MD  insulin glargine (LANTUS) 100 UNIT/ML injection Inject 0.2 mLs (20  Units total) into the skin daily. 08/11/19   Bonnell Public, MD  senna-docusate (SENOKOT-S) 8.6-50 MG tablet Take 1 tablet by mouth at bedtime as needed for mild constipation. 08/10/19   Bonnell Public, MD    Allergies    Januvia [sitagliptin] and Metformin and related  Review of Systems   Review of Systems  Unable to perform ROS: Other    Physical Exam Updated Vital Signs BP (!) 162/79 (BP Location: Right Arm)   Pulse 71   Temp 98.6 F (37 C)   Resp 11   SpO2 98%   Physical Exam Vitals and nursing note reviewed.  Constitutional:      General: She is not in acute distress.    Appearance: She is not diaphoretic.  HENT:     Head: Normocephalic.  Eyes:     General: No scleral icterus.    Conjunctiva/sclera: Conjunctivae normal.  Cardiovascular:     Rate and Rhythm: Normal rate and regular rhythm.     Pulses: Normal pulses.          Radial pulses are 2+ on the right side and 2+ on the left side.       Dorsalis pedis pulses are 2+ on the right side and 2+ on the left side.  Pulmonary:     Effort: No tachypnea, accessory muscle usage, prolonged expiration, respiratory distress or retractions.     Breath sounds: No stridor.     Comments: Equal chest rise. No increased work of breathing. Abdominal:     General: There is no distension.     Palpations: Abdomen is soft.     Tenderness: There is no abdominal tenderness. There is no guarding or rebound.  Musculoskeletal:     Cervical back: Normal range of motion. No rigidity.     Right hip: Deformity and tenderness present. Decreased range of motion.     Comments: Right hip with deformity and decreased range of motion.  Mild rotation noted.  Patient unable to move right leg at all.  Skin:    General: Skin is warm and dry.     Capillary Refill: Capillary refill takes less than 2 seconds.  Neurological:     Mental Status: She is alert.     GCS: GCS eye subscore is 4. GCS verbal subscore is 4. GCS motor subscore is 6.   Psychiatric:        Mood and Affect: Mood normal.     ED Results / Procedures / Treatments   Labs (all labs ordered are listed, but only abnormal results are displayed) Labs Reviewed  CBC WITH DIFFERENTIAL/PLATELET - Abnormal; Notable for the following components:      Result Value   RBC 3.11 (*)    Hemoglobin 8.6 (*)    HCT 27.4 (*)  All other components within normal limits  COMPREHENSIVE METABOLIC PANEL - Abnormal; Notable for the following components:   Potassium 3.4 (*)    Glucose, Bld 138 (*)    Total Protein 6.4 (*)    Albumin 2.8 (*)    All other components within normal limits  SARS CORONAVIRUS 2 (TAT 6-24 HRS)  PROTIME-INR  URINALYSIS, ROUTINE W REFLEX MICROSCOPIC  POC OCCULT BLOOD, ED  TYPE AND SCREEN  ABO/RH    EKG EKG Interpretation  Date/Time:  Thursday October 17 2019 05:19:42 EST Ventricular Rate:  72 PR Interval:    QRS Duration: 98 QT Interval:  448 QTC Calculation: 491 R Axis:   -23 Text Interpretation: Sinus rhythm Borderline left axis deviation Probable anteroseptal infarct, old No acute changes Confirmed by Drema Pry [phone]) on 10/17/2019 5:45:56 AM   Radiology DG Chest 1 View  Result Date: 10/17/2019 CLINICAL DATA:  63 year old female status post fall with hip deformity. EXAM: CHEST  1 VIEW COMPARISON:  Portable chest 08/02/2019. FINDINGS: AP view at 0538 hours. Stable lung volumes and mediastinal contours. Cardiac size at the upper limits of normal. Visualized tracheal air column is within normal limits. Skin fold artifact in the right lung. No pneumothorax identified. Allowing for portable technique the lungs are clear. Negative visible bowel gas pattern. Nipple shadows in the upper abdomen. No acute osseous abnormality identified. IMPRESSION: No acute cardiopulmonary abnormality. Electronically Signed   By: Odessa Fleming M.D.   On: 10/17/2019 06:13   CT HEAD WO CONTRAST  Result Date: 10/17/2019 CLINICAL DATA:  63 year old female status  post fall with right femur fracture. EXAM: CT HEAD WITHOUT CONTRAST TECHNIQUE: Contiguous axial images were obtained from the base of the skull through the vertex without intravenous contrast. COMPARISON:  Brain MRI 08/02/2019. FINDINGS: Brain: Sequelae of left cerebellar infarct seen in October with encephalomalacia now. Superimposed chronic lacunar infarcts in the deep gray nuclei including the right thalamus. Stable cerebral volume. No midline shift, ventriculomegaly, mass effect, evidence of mass lesion, intracranial hemorrhage or evidence of cortically based acute infarction. Vascular: Mild Calcified atherosclerosis at the skull base. No suspicious intracranial vascular hyperdensity. Skull: Negative. Sinuses/Orbits: Mild maxillary and ethmoid sinus mucosal thickening is new since October. Tympanic cavities and mastoids remain clear. Other: No acute orbit or scalp soft tissue findings. IMPRESSION: 1. No acute traumatic injury or acute intracranial abnormality identified. 2. 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 Underlying chronic small vessel disease. Electronically Signed   By: Odessa Fleming M.D.   On: 10/17/2019 06:21   CT CERVICAL SPINE WO CONTRAST  Result Date: 10/17/2019 CLINICAL DATA:  64 year old female status post fall with right femur fracture. EXAM: CT CERVICAL SPINE WITHOUT CONTRAST TECHNIQUE: Multidetector CT imaging of the cervical spine was performed without intravenous contrast. Multiplanar CT image reconstructions were also generated. COMPARISON:  08/02/2019. FINDINGS: Alignment: Straightening of cervical lordosis has not significantly changed. Cervicothoracic junction alignment is within normal limits. Bilateral posterior element alignment is within normal limits. Skull base and vertebrae: Visualized skull base is intact. No atlanto-occipital dissociation. No acute osseous abnormality identified. A small round lucency in the posterior C3 vertebral body is stable on  series 7, image 33 and likely benign. Soft tissues and spinal canal: No prevertebral fluid or swelling. No visible canal hematoma. 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 This contributes to multifactorial widespread cervical spinal stenosis which is moderate at  multiple levels, and possibly severe at C5-C6. Superimposed bulky cervical endplate spurring. No cervical ankylosis, although there does appear to be bridging bone at the T1-T2 level. Upper chest: Visible upper thoracic levels appear intact. Negative lung apices. IMPRESSION: 1. No acute traumatic injury identified in the cervical spine. 2. 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 If there are myelopathic symptoms note that this degree of stenosis could predispose to spinal cord injury in a fall. Electronically Signed   By: Odessa Fleming M.D.   On: 10/17/2019 06:26   DG Hip Unilat With Pelvis 2-3 Views Right  Result Date: 10/17/2019 CLINICAL DATA:  63 year old female status post fall with hip deformity. EXAM: DG HIP (WITH OR WITHOUT PELVIS) 2-3V RIGHT COMPARISON:  None. FINDINGS: Comminuted intertrochanteric fracture of the proximal right femur with mild varus impaction. The right femoral head remains normally located. No superimposed acute pelvis fracture identified. Grossly intact proximal left femur. Previous left inguinal region hernia repair with mesh. Negative visible bowel gas pattern. IMPRESSION: 1. Right femur comminuted intertrochanteric fracture with mild varus impaction. 2. No superimposed pelvis fracture identified. Electronically Signed   By: Odessa Fleming M.D.   On: 10/17/2019 06:14    Procedures Procedures (including critical care time)  Medications Ordered in ED Medications  0.9 %  sodium chloride infusion ( Intravenous New  Bag/Given 10/17/19 0320)  fentaNYL (SUBLIMAZE) injection 25 mcg (has no administration in time range)    ED Course  I have reviewed the triage vital signs and the nursing notes.  Pertinent labs & imaging results that were available during my care of the patient were reviewed by me and considered in my medical decision making (see chart for details).  Clinical Course as of Oct 16 710  Thu Oct 17, 2019  9381 Discussed with Malachi Bonds at [facility] who reports the patient was transferred tonight due to x-ray showing fracture.  She reports the patient reported a fall to the CNA over the weekend (unwitnessed), but there is no documentation of a fall on the 17th or over the weekend in their records. She reports over the weekend, pt was c/o leg pain and was given Tylenol.  RN reports documented fall on 12/3 with no x-ray at that time.  She reports pt has primarily been kept in bed due COVID.  Night RN does not know if pt ambulates on her own, but notes that the patient has weakness and has "limited mobility." X-ray was ordered this afternoon due to ongoing complaints of leg pain.   [HM]  0609 Discussed with niece who reports she fell sometime last week.  Monday she was complaining of leg pain.  She was at this facility for rehab after a CVA with weakness.  She was ambulatory with a walker and assistance.   Jeanella Anton - niece @ [phone] Matilde Haymaker [phone]   [HM]  [phone] Pt with HTN.  Hx of same  BP(!): 174/74 [HM]  0650 Discussed with Dr. Roda Shutters.  Requests pt be kept NPO as they will try to fix today.   [HM]  [id number] Noted.  Patient denies bloody stools but has limited history ability.  Hemoglobin(!): 8.6 [HM]  0709 Fecal occult negative.  Fecal Occult Blood, POC: NEGATIVE [HM]  0709 No acute injury on head CT.  CT CERVICAL SPINE WO CONTRAST [HM]  [id number] Intertrochanteric fracture noted.  I personally evaluated these images.  DG Hip Unilat With Pelvis 2-3 Views Right [HM]  Clinical Course  User Index [HM] Charnetta Wulff, Boyd KerbsHannah, PA-C   MDM Rules/Calculators/A&P                       Presents after fall at unknown time.  Outpatient x-ray shows femur fracture.  I am unable to view the images.  Additionally, patient found to have bedbugs on arrival.  Labs largely reassuring.  Mild anemia from baseline.  Fecal occult negative.  No evidence of acute GI bleed.  Confirmed intertrochanteric fracture.  Discussed with niece and brother who want fracture fixed.  Discussed with Ortho who will attempt to fix in OR today.  Patient will remain n.p.o.  Discussed patient's case with hospitalist, Dr. Katrinka BlazingSmith.  I have recommended admission and patient (and family if present) agree with this plan. Admitting physician will place admission orders.  Final Clinical Impression(s) / ED Diagnoses Final diagnoses:  Closed displaced intertrochanteric fracture of right femur, initial encounter [facility](HCC)    Rx / DC Orders ED Discharge Orders    None       Mardene SayerMuthersbaugh, Boyd KerbsHannah, PA-C 10/17/19 [id number]    Nira Connardama, Pedro Eduardo, MD 10/17/19 [phone]

## 2019-10-17 NOTE — Anesthesia Preprocedure Evaluation (Signed)
Anesthesia Evaluation  Patient identified by MRN, date of birth, ID band Patient awake    Reviewed: Allergy & Precautions, NPO status , Patient's Chart, lab work & pertinent test results  Airway Mallampati: I  TM Distance: >3 FB Neck ROM: Full    Dental   Pulmonary    Pulmonary exam normal        Cardiovascular hypertension, Normal cardiovascular exam     Neuro/Psych Depression CVA    GI/Hepatic   Endo/Other  diabetes, Type 2  Renal/GU      Musculoskeletal   Abdominal   Peds  Hematology   Anesthesia Other Findings   Reproductive/Obstetrics                             Anesthesia Physical Anesthesia Plan  ASA: III  Anesthesia Plan: General   Post-op Pain Management:    Induction: Intravenous  PONV Risk Score and Plan: 3 and Ondansetron, Midazolam and Treatment may vary due to age or medical condition  Airway Management Planned: Oral ETT  Additional Equipment:   Intra-op Plan:   Post-operative Plan: Extubation in OR  Informed Consent: I have reviewed the patients History and Physical, chart, labs and discussed the procedure including the risks, benefits and alternatives for the proposed anesthesia with the patient or authorized representative who has indicated his/her understanding and acceptance.       Plan Discussed with: CRNA and Surgeon  Anesthesia Plan Comments:         Anesthesia Quick Evaluation

## 2019-10-17 NOTE — Discharge Instructions (Signed)
-  Okay for full weightbearing as tolerated to the right leg.  -Maintain postoperative bandage at all times.  You may shower with this bandage in place.  If this bandage becomes saturated or detached you may replace with a daily dry dressing.  -For the prevention of blood clots you will have resumed your preoperative Plavix.  -Apply ice to the right hip for 30 minutes at a time for 4-5 times per day.  -Follow-up with Dr. Stann Mainland in 2 weeks for routine postop care and staple removal.

## 2019-10-17 NOTE — Op Note (Signed)
Date of Surgery: 10/17/2019  INDICATIONS: Lauren Waller is a 63 y.o.-year-old female who sustained a right hip fracture. The risks and benefits of the procedure discussed with the patient prior to the procedure and all questions were answered; consent was obtained.  PREOPERATIVE DIAGNOSIS: right hip fracture   POSTOPERATIVE DIAGNOSIS: Same   PROCEDURE: Treatment of intertrochanteric, pertrochanteric, subtrochanteric fracture with intramedullary implant. CPT [id number]   SURGEON: Kathi Der. Aundria Rud, M.D.   ANESTHESIA: general   IV FLUIDS AND URINE: See anesthesia record   ESTIMATED BLOOD LOSS: 150 cc  IMPLANTS:  Synthes TFNA 10 x 235 mm 90 mm proximal compression screw 5.0 x 3mm distal interlock x 1  DRAINS: None.   COMPLICATIONS: None.   DESCRIPTION OF PROCEDURE: The patient was brought to the operating room and placed supine on the operating table. The patient's leg had been signed prior to the procedure. The patient had the anesthesia placed by the anesthesiologist. The prep verification and incision time-outs were performed to confirm that this was the correct patient, site, side and location. The patient had an SCD on the opposite lower extremity. The patient did receive antibiotics prior to the incision and was re-dosed during the procedure as needed at indicated intervals. The patient was positioned on the fracture table with the table in traction and internal rotation to reduce the hip. The well leg was placed in a scissor position and all bony prominences were well-padded. The patient had the lower extremity prepped and draped in the standard surgical fashion. The incision was made 4 finger breadths superior to the greater trochanter. A guide pin was inserted into the tip of the greater trochanter under fluoroscopic guidance. An opening reamer was used to gain access to the femoral canal. The nail length was measured and inserted down the femoral canal to its proper depth. The  appropriate version of insertion for the lag screw was found under fluoroscopy. A pin was inserted up the femoral neck through the jig. The length of the lag screw was then measured. The lag screw was inserted as near to center-center in the head as possible. The leg was taken out of traction, then the compression screw was used to compress across the fracture. Compression was visualized on serial xrays.   We next turned our attention to the distal interlocking screw.  This was placed through the drill guide of the nail inserter.  A small incision was made overlying the lateral thigh at the screw site, and a tonsil was used to disect down to bone.  A drill pass was made through the jig and across the nail through both cortices.  This was measured, and the appropriate screw was placed under hand power and found to have good bite.    The wound was copiously irrigated with saline and the subcutaneous layer closed with 2.0 monocryl and the skin was reapproximated with staples. The wounds were cleaned and dried a final time and a sterile dressing was placed. The hip was taken through a range of motion at the end of the case under fluoroscopic imaging to visualize the approach-withdraw phenomenon and confirm implant length in the head. The patient was then awakened from anesthesia and taken to the recovery room in stable condition. All counts were correct at the end of the case.   POSTOPERATIVE PLAN: The patient will be weight bearing as tolerated and will return in 2 weeks for staple removal and the patient will receive DVT prophylaxis based on other medications, activity  level, and risk ratio of bleeding to thrombosis.     Geralynn Rile, MD [facility] [place] [phone] 4:23 PM

## 2019-10-17 NOTE — ED Notes (Signed)
Pt is NSR on monitor 

## 2019-10-17 NOTE — ED Notes (Addendum)
Bedside report going to be given to [place], Therapist, sports on 5N

## 2019-10-17 NOTE — ED Notes (Signed)
Patient declined the need for pain medication at this time. Pt remains a/ox4

## 2019-10-17 NOTE — ED Triage Notes (Addendum)
Pt bib [facility] from [facility]. Per staff pt fell on 12/3 and then fell again on 12/18. Healthcare center completed x-rays on 12/23 after pt c/o R leg pain and noted pt has R femur fx. Pt received 28mcg fetanyl with EMS. Pt alert and at neuro baseline. 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 EMS VSS

## 2019-10-17 NOTE — H&P (Addendum)
History and Physical    [name] MCN:[id number] DOB: 1956/02/24 DOA: 10/17/2019  Referring MD/NP/PA: Dierdre Forth, PA-C PCP: Jackie Plum, MD  Patient coming from: [facility] care center via EMS  Chief Complaint: Right leg pain  I have personally briefly reviewed patient's old medical records in [facility]   HPI: Lauren Waller is a 63 y.o. female with medical history significant of hypertension, hyperlipidemia, CVA in 07/2019, diabetes mellitus type 2, depression, and chronic low back pain.  Patient presents with complaints of a 2-day history of right leg pain.  Patient has a poor historian and additional history is obtained from talks with staff.  At the facility they documented patient falling last on 12/3, but patient reported also falling on the 18th.  She reports that she has been dizzy.  Unclear if patient lost consciousness or had any trauma to her head.  She complains of severe pain on the right leg with any kind of movement.  Denies having any nausea or vomiting symptoms.  Staff note that the patient since getting to the rehab facility has progressively declined to the point where she is maximum assist.  Staff report that patient has been having  intermittent confusion and memory loss.  ED Course: Upon admission into the emergency department patient was seen to be afebrile with blood pressure elevated up to 174/74, and all other vital signs maintained.  Labs significant for hemoglobin 8.6, potassium 3.4, and albumin 2.8.  CT scan of the head and cervical spine noted multilevel degenerative disc changes with moderate to severe spinal stenosis possibly related with falls.  Chest x-ray was unremarkable.  X-rays revealed a right femur communicated intertrochanteric fracture with signs of action.  Orthopedics was consulted and we will see the patient.  Patient was given normal saline IV fluids and fentanyl as needed for pain.  TRH called to  admit.  Review of Systems  Constitutional: Negative for chills and fever.  HENT: Negative for congestion.   Eyes: Negative for photophobia and pain.  Respiratory: Negative for cough and shortness of breath.   Cardiovascular: Negative for chest pain.  Gastrointestinal: Negative for abdominal pain, nausea and vomiting.  Genitourinary: Negative for hematuria.  Musculoskeletal: Positive for falls, joint pain and myalgias.  Skin: Negative for itching.  Neurological: Negative for focal weakness.  Psychiatric/Behavioral: Positive for memory loss.  Limited due to reports of memory loss.  Past Medical History:  Diagnosis Date  . Chronic lower back pain   . Depression   . Facial cellulitis 04/17/2018  . HLD (hyperlipidemia)   . Hypertension   . Type II diabetes mellitus (HCC)     Past Surgical History:  Procedure Laterality Date  . BUBBLE STUDY  08/06/2019   Procedure: BUBBLE STUDY;  Surgeon: Chrystie Nose, MD;  Location: [facility] ENDOSCOPY;  Service: Cardiovascular;;  . CATARACT EXTRACTION W/ INTRAOCULAR LENS  IMPLANT, BILATERAL Bilateral   . EYE SURGERY Bilateral    2016  . fatty tissue removal from upper back     1998  . INGUINAL HERNIA REPAIR Left   . MYOMECTOMY    . ROBOTIC ASSISTED LAPAROSCOPIC VAGINAL HYSTERECTOMY WITH FIBROID REMOVAL     2009  . TEE WITHOUT CARDIOVERSION N/A 08/06/2019   Procedure: TRANSESOPHAGEAL ECHOCARDIOGRAM (TEE);  Surgeon: Chrystie Nose, MD;  Location: [facility] ENDOSCOPY;  Service: Cardiovascular;  Laterality: N/A;     reports that she has never smoked. She has never used smokeless tobacco. She reports previous alcohol use. She reports that she does  not use drugs.  Allergies  Allergen Reactions  . Januvia [Sitagliptin] Swelling  . Metformin And Related Nausea And Vomiting    Family History  Problem Relation Age of Onset  . Hypertension Mother   . Pancreatic cancer Brother   . Stroke Neg Hx     Prior to Admission medications   Medication Sig  Start Date End Date Taking? Authorizing Provider  amLODipine (NORVASC) 10 MG tablet Take 10 mg by mouth daily. 03/23/18   [provider]  aspirin EC 81 MG EC tablet Take 1 tablet (81 mg total) by mouth daily. 08/11/19   Berton Mountgbata, Sylvester I, MD  atenolol (TENORMIN) 100 MG tablet Take 1 tablet by mouth daily 12/20/18   Patwardhan, Anabel BeneManish J, MD  atorvastatin (LIPITOR) 40 MG tablet Take 1 tablet (40 mg total) by mouth daily. 08/11/19   Barnetta Chapelgbata, Sylvester I, MD  B Complex-C (B-COMPLEX WITH VITAMIN C) tablet Take 1 tablet by mouth daily. 08/11/19   Berton Mountgbata, Sylvester I, MD  cloNIDine (CATAPRES) 0.1 MG tablet Take 1 tablet (0.1 mg total) by mouth 2 (two) times daily. 08/10/19   Berton Mountgbata, Sylvester I, MD  clopidogrel (PLAVIX) 75 MG tablet Take 1 tablet (75 mg total) by mouth daily. 08/11/19   Barnetta Chapelgbata, Sylvester I, MD  glipiZIDE (GLUCOTROL XL) 2.5 MG 24 hr tablet Take 1 tablet (2.5 mg total) by mouth daily with breakfast. 08/11/19   Barnetta Chapelgbata, Sylvester I, MD  hydrALAZINE (APRESOLINE) 100 MG tablet Take 1 tablet (100 mg total) by mouth every 8 (eight) hours. 08/10/19   Berton Mountgbata, Sylvester I, MD  insulin glargine (LANTUS) 100 UNIT/ML injection Inject 0.2 mLs (20 Units total) into the skin daily. 08/11/19   Barnetta Chapelgbata, Sylvester I, MD  senna-docusate (SENOKOT-S) 8.6-50 MG tablet Take 1 tablet by mouth at bedtime as needed for mild constipation. 08/10/19   Barnetta Chapelgbata, Sylvester I, MD    Physical Exam:  Constitutional: Older female currently in NAD, calm, and comfortable. Vitals:   10/17/19 0500 10/17/19 0530 10/17/19 0700 10/17/19 0702  BP: (!) 171/72 (!) 174/74 (!) 170/79   Pulse: 62 65 75   Resp: 13 14 13    Temp:    (S) 98.8 F (37.1 C)  TempSrc:    (S) Rectal  SpO2: 99% 98% 99%    Eyes: PERRL, lids and conjunctivae normal ENMT: Mucous membranes are moist. Posterior pharynx clear of any exudate or lesions.Normal dentition.  Neck: normal, supple, no masses, no thyromegaly Respiratory: clear to auscultation  bilaterally, no wheezing, no crackles. Normal respiratory effort. No accessory muscle use.  Cardiovascular: Regular rate and rhythm, no murmurs / rubs / gallops. No extremity edema. 2+ pedal pulses. No carotid bruits.  Abdomen: no tenderness, no masses palpated. No hepatosplenomegaly. Bowel sounds positive.  Musculoskeletal: no clubbing / cyanosis.  Right leg is externally rotated and mildly shortened.  Tenderness to palpation of the upper thigh of the right leg, but no breaks in skin seen. Skin: no rashes, lesions, ulcers. No induration Neurologic: CN 2-12 grossly intact.  Speech is hard to understand at times and appears slurred.  Patient able to move all extremities. Psychiatric: Normal judgment and insight. Alert and oriented x person and place. Normal mood.     Labs on Admission: I have personally reviewed following labs and imaging studies  CBC: Recent Labs  Lab 10/17/19 0339  WBC 9.4  NEUTROABS 6.9  HGB 8.6*  HCT 27.4*  MCV 88.1  PLT 325   Basic Metabolic Panel: Recent Labs  Lab 10/17/19 0339  NA 140  K 3.4*  CL 101  CO2 29  GLUCOSE 138*  BUN 23  CREATININE 0.95  CALCIUM 9.2   GFR: CrCl cannot be calculated (Unknown ideal weight.). Liver Function Tests: Recent Labs  Lab 10/17/19 0339  AST 27  ALT 23  ALKPHOS 74  BILITOT 0.9  PROT 6.4*  ALBUMIN 2.8*   No results for input(s): LIPASE, AMYLASE in the last 168 hours. No results for input(s): AMMONIA in the last 168 hours. Coagulation Profile: Recent Labs  Lab 10/17/19 0339  INR 1.0   Cardiac Enzymes: No results for input(s): CKTOTAL, CKMB, CKMBINDEX, TROPONINI in the last 168 hours. BNP (last 3 results) No results for input(s): PROBNP in the last 8760 hours. HbA1C: No results for input(s): HGBA1C in the last 72 hours. CBG: No results for input(s): GLUCAP in the last 168 hours. Lipid Profile: No results for input(s): CHOL, HDL, LDLCALC, TRIG, CHOLHDL, LDLDIRECT in the last 72 hours. Thyroid  Function Tests: No results for input(s): TSH, T4TOTAL, FREET4, T3FREE, THYROIDAB in the last 72 hours. Anemia Panel: No results for input(s): VITAMINB12, FOLATE, FERRITIN, TIBC, IRON, RETICCTPCT in the last 72 hours. Urine analysis:    Component Value Date/Time   COLORURINE STRAW (A) 08/02/2019 0529   APPEARANCEUR HAZY (A) 08/02/2019 0529   LABSPEC 1.030 08/02/2019 0529   PHURINE 6.0 08/02/2019 0529   GLUCOSEU >=500 (A) 08/02/2019 0529   HGBUR NEGATIVE 08/02/2019 0529   BILIRUBINUR NEGATIVE 08/02/2019 0529   KETONESUR 20 (A) 08/02/2019 0529   PROTEINUR 100 (A) 08/02/2019 0529   NITRITE NEGATIVE 08/02/2019 0529   LEUKOCYTESUR NEGATIVE 08/02/2019 0529   Sepsis Labs: No results found for this or any previous visit (from the past 240 hour(s)).   Radiological Exams on Admission: DG Chest 1 View  Result Date: 10/17/2019 CLINICAL DATA:  63 year old female status post fall with hip deformity. EXAM: CHEST  1 VIEW COMPARISON:  Portable chest 08/02/2019. FINDINGS: AP view at 0538 hours. Stable lung volumes and mediastinal contours. Cardiac size at the upper limits of normal. Visualized tracheal air column is within normal limits. Skin fold artifact in the right lung. No pneumothorax identified. Allowing for portable technique the lungs are clear. Negative visible bowel gas pattern. Nipple shadows in the upper abdomen. No acute osseous abnormality identified. IMPRESSION: No acute cardiopulmonary abnormality. Electronically Signed   By: Odessa Fleming M.D.   On: 10/17/2019 06:13   CT HEAD WO CONTRAST  Result Date: 10/17/2019 CLINICAL DATA:  63 year old female status post fall with right femur fracture. EXAM: CT HEAD WITHOUT CONTRAST TECHNIQUE: Contiguous axial images were obtained from the base of the skull through the vertex without intravenous contrast. COMPARISON:  Brain MRI 08/02/2019. FINDINGS: Brain: Sequelae of left cerebellar infarct seen in October with encephalomalacia now. Superimposed chronic  lacunar infarcts in the deep gray nuclei including the right thalamus. Stable cerebral volume. No midline shift, ventriculomegaly, mass effect, evidence of mass lesion, intracranial hemorrhage or evidence of cortically based acute infarction. Vascular: Mild Calcified atherosclerosis at the skull base. No suspicious intracranial vascular hyperdensity. Skull: Negative. Sinuses/Orbits: Mild maxillary and ethmoid sinus mucosal thickening is new since October. Tympanic cavities and mastoids remain clear. Other: No acute orbit or scalp soft tissue findings. IMPRESSION: 1. No acute traumatic injury or acute intracranial abnormality identified. 2. 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 Underlying chronic small vessel disease. Electronically Signed   By: Odessa Fleming M.D.   On: 10/17/2019 06:21   CT CERVICAL SPINE WO  CONTRAST  Result Date: 10/17/2019 CLINICAL DATA:  63 year old female status post fall with right femur fracture. EXAM: CT CERVICAL SPINE WITHOUT CONTRAST TECHNIQUE: Multidetector CT imaging of the cervical spine was performed without intravenous contrast. Multiplanar CT image reconstructions were also generated. COMPARISON:  08/02/2019. FINDINGS: Alignment: Straightening of cervical lordosis has not significantly changed. Cervicothoracic junction alignment is within normal limits. Bilateral posterior element alignment is within normal limits. Skull base and vertebrae: Visualized skull base is intact. No atlanto-occipital dissociation. No acute osseous abnormality identified. A small round lucency in the posterior C3 vertebral body is stable on series 7, image 33 and likely benign. Soft tissues and spinal canal: No prevertebral fluid or swelling. No visible canal hematoma. 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 This contributes to multifactorial widespread cervical spinal stenosis which  is moderate at multiple levels, and possibly severe at C5-C6. Superimposed bulky cervical endplate spurring. No cervical ankylosis, although there does appear to be bridging bone at the T1-T2 level. Upper chest: Visible upper thoracic levels appear intact. Negative lung apices. IMPRESSION: 1. No acute traumatic injury identified in the cervical spine. 2. 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 If there are myelopathic symptoms note that this degree of stenosis could predispose to spinal cord injury in a fall. Electronically Signed   By: Odessa Fleming M.D.   On: 10/17/2019 06:26   DG Hip Unilat With Pelvis 2-3 Views Right  Result Date: 10/17/2019 CLINICAL DATA:  63 year old female status post fall with hip deformity. EXAM: DG HIP (WITH OR WITHOUT PELVIS) 2-3V RIGHT COMPARISON:  None. FINDINGS: Comminuted intertrochanteric fracture of the proximal right femur with mild varus impaction. The right femoral head remains normally located. No superimposed acute pelvis fracture identified. Grossly intact proximal left femur. Previous left inguinal region hernia repair with mesh. Negative visible bowel gas pattern. IMPRESSION: 1. Right femur comminuted intertrochanteric fracture with mild varus impaction. 2. No superimposed pelvis fracture identified. Electronically Signed   By: Odessa Fleming M.D.   On: 10/17/2019 06:14    EKG: Independently reviewed.  Sinus rhythm at 72 bpm with QTc 491  Assessment/Plan Right femur communicated intertrochanteric fracture secondary to fall: Patient reportedly complaining of right leg pain over the last week.  Noted to have fell at the facility on the 3rd and 18th of this month.  X-rays yesterday revealed that he right femur fracture.  Orthopedics has been consulted. -Admit to a medical telemetry bed -Hip fracture order set utilized -Fentanyl for pain control -Appreciate  orthopedic consultative services we will follow-up for further recommendations  Normocytic anemia: Acute.  Hemoglobin 8.6 on admission, but previously had been within the normal limits at the beginning of October.  Fecal occult stool studies were negative.  Patient was on dual antiplatelet therapy for some period in time which may have put her at increased risk for bleeding.  Patient had already been typed and screened for possible procedure. -Continue to monitor   Hypokalemia: Acute.  On admission patient potassium noted to be 3.4. -Give 20 mEq of potassium chloride IV -Continue to monitor in place as  Hypertensive urgency: Blood pressures have been elevated into the 190s.  Patient on amlodipine, atenolol, clonidine, and hydralazine. -Restart patient current home regimen  Diabetes mellitus type 2: Home medications include glipizide and Lantus. -Hypoglycemic protocols -Hold glipizide  -CBGs before every meal  with sensitive SSI -Restart home Lantus when tolerating p.o.  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  She appears to have some residual memory deficits anddysarthria.  Patient was advised to start on aspirin 81 mg daily with Plavix 75 mg daily.  The plan was for patient to take Plavix for 3 weeks that would have been completed on 10/31. -Continue statin and restart aspirin when medically appropriate  Hyperlipidemia -Continue atorvastatin  Hypoalbuminemia: Acute.  Albumin noted to be 2.8 on admission.  -Check prealbumin in a.m.  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  DVT prophylaxis: Lovenox Code Status: Full Family Communication: No family present at bedside Disposition Plan: Likely discharge to SNF Consults called: Orthopedics Admission status: Inpatient  Norval Morton MD [facility] Pager [phone]   If 7PM-7AM, please contact  night-coverage [email or web address] Password [email or web address]  10/17/2019, 7:12 AM

## 2019-10-17 NOTE — Progress Notes (Signed)
Orthopedic Tech Progress Note Patient Details:  Lauren Waller 1956/09/19 [id number] Applied Over head frame with Trapeze. Patient ID: [name], female   DOB: 02/28/1956, 63 y.o.   MRN: [id number]   [name] 10/17/2019, 6:11 PM

## 2019-10-18 DIAGNOSIS — S72141D Displaced intertrochanteric fracture of right femur, subsequent encounter for closed fracture with routine healing: Secondary | ICD-10-CM

## 2019-10-18 DIAGNOSIS — I10 Essential (primary) hypertension: Secondary | ICD-10-CM

## 2019-10-18 LAB — GLUCOSE, CAPILLARY
Glucose-Capillary: 288 mg/dL — ABNORMAL HIGH (ref 70–99)
Glucose-Capillary: 353 mg/dL — ABNORMAL HIGH (ref 70–99)
Glucose-Capillary: 352 mg/dL — ABNORMAL HIGH (ref 70–99)
Glucose-Capillary: 133 mg/dL — ABNORMAL HIGH (ref 70–99)

## 2019-10-18 LAB — CBC
HCT: 25.8 % — ABNORMAL LOW (ref 36.0–46.0)
Hemoglobin: 8.1 g/dL — ABNORMAL LOW (ref 12.0–15.0)
MCH: 27.4 pg (ref 26.0–34.0)
MCHC: 31.4 g/dL (ref 30.0–36.0)
MCV: 87.2 fL (ref 80.0–100.0)
Platelets: 365 10*3/uL (ref 150–400)
RBC: 2.96 MIL/uL — ABNORMAL LOW (ref 3.87–5.11)
RDW: 13.3 % (ref 11.5–15.5)
WBC: 12 10*3/uL — ABNORMAL HIGH (ref 4.0–10.5)
nRBC: 0 % (ref 0.0–0.2)

## 2019-10-18 LAB — BASIC METABOLIC PANEL
Anion gap: 10 (ref 5–15)
BUN: 24 mg/dL — ABNORMAL HIGH (ref 8–23)
CO2: 24 mmol/L (ref 22–32)
Calcium: 8.6 mg/dL — ABNORMAL LOW (ref 8.9–10.3)
Chloride: 102 mmol/L (ref 98–111)
Creatinine, Ser: 0.73 mg/dL (ref 0.44–1.00)
GFR calc Af Amer: >60 mL/min (ref >60)
GFR calc non Af Amer: >60 mL/min (ref >60)
Glucose, Bld: 313 mg/dL — ABNORMAL HIGH (ref 70–99)
Potassium: 4.5 mmol/L (ref 3.5–5.1)
Sodium: 136 mmol/L (ref 135–145)

## 2019-10-18 LAB — IRON AND TIBC
Iron: 29 ug/dL (ref 28–170)
Saturation Ratios: 15 % (ref 10.4–31.8)
TIBC: 196 ug/dL — ABNORMAL LOW (ref 250–450)
UIBC: 167 ug/dL

## 2019-10-18 LAB — VITAMIN B12: Vitamin B-12: 1025 pg/mL — ABNORMAL HIGH (ref 180–914)

## 2019-10-18 LAB — RETICULOCYTES
Immature Retic Fract: 11.3 % (ref 2.3–15.9)
RBC.: 2.76 MIL/uL — ABNORMAL LOW (ref 3.87–5.11)
Retic Count, Absolute: 50.5 10*3/uL (ref 19.0–186.0)
Retic Ct Pct: 1.8 % (ref 0.4–3.1)

## 2019-10-18 LAB — FERRITIN: Ferritin: 283 ng/mL (ref 11–307)

## 2019-10-18 LAB — PREALBUMIN: Prealbumin: 20.8 mg/dL (ref 18–38)

## 2019-10-18 LAB — FOLATE: Folate: 10.9 ng/mL (ref >5.9)

## 2019-10-18 MED ORDER — INSULIN ASPART 100 UNIT/ML ~~LOC~~ SOLN
0.0000 [IU] | Freq: Three times a day (TID) | SUBCUTANEOUS | Status: DC
  Administered 2019-10-18: 15 [IU] via SUBCUTANEOUS
  Administered 2019-10-19 – 2019-10-21 (×4): 2 [IU] via SUBCUTANEOUS
  Administered 2019-10-21: 3 [IU] via SUBCUTANEOUS
  Administered 2019-10-22: 2 [IU] via SUBCUTANEOUS
  Administered 2019-10-24: 3 [IU] via SUBCUTANEOUS

## 2019-10-18 MED ORDER — INSULIN GLARGINE 100 UNIT/ML ~~LOC~~ SOLN
20.0000 [IU] | Freq: Every day | SUBCUTANEOUS | Status: DC
  Administered 2019-10-18 – 2019-10-22 (×5): 20 [IU] via SUBCUTANEOUS
  Filled 2019-10-18 (×6): qty 0.2

## 2019-10-18 MED ORDER — INSULIN ASPART 100 UNIT/ML ~~LOC~~ SOLN: 0.0000 [IU] | Freq: Every day | SUBCUTANEOUS | Status: DC

## 2019-10-18 MED ORDER — INSULIN ASPART 100 UNIT/ML ~~LOC~~ SOLN
2.0000 [IU] | Freq: Three times a day (TID) | SUBCUTANEOUS | Status: DC
  Administered 2019-10-19 – 2019-10-23 (×7): 2 [IU] via SUBCUTANEOUS

## 2019-10-18 NOTE — Plan of Care (Signed)

## 2019-10-18 NOTE — Progress Notes (Signed)
Orthopedics Progress Note  Subjective: Patient comfortable this morning. Pain controlled  Objective:  Vitals:   10/17/19 1951 10/18/19 0434  BP: (!) 142/76 (!) 141/76  Pulse: 79 77  Resp: 17 18  Temp: 98.6 F (37 C) 99 F (37.2 C)  SpO2: 99% 97%    General: Awake and alert  Musculoskeletal: Right hip dressings CDI, no pain with AROM of the ankle Neurovascularly intact  Lab Results  Component Value Date   WBC 12.0 (H) 10/18/2019   HGB 8.1 (L) 10/18/2019   HCT 25.8 (L) 10/18/2019   MCV 87.2 10/18/2019   PLT 365 10/18/2019       Component Value Date/Time   NA 136 10/18/2019 0329   K 4.5 10/18/2019 0329   CL 102 10/18/2019 0329   CO2 24 10/18/2019 0329   GLUCOSE 313 (H) 10/18/2019 0329   BUN 24 (H) 10/18/2019 0329   CREATININE 0.73 10/18/2019 0329   CALCIUM 8.6 (L) 10/18/2019 0329   GFRNONAA >60 10/18/2019 0329   GFRAA >60 10/18/2019 0329    Lab Results  Component Value Date   INR 1.0 10/17/2019    Assessment/Plan: POD #1 s/p Procedure(s): INTRAMEDULLARY (IM) NAIL INTERTROCHANTRIC WBAT on the right. Up with therapy DVT prophylaxis Doing well so far  Doran Heater. Veverly Fells, MD 10/18/2019 6:53 AM

## 2019-10-18 NOTE — Progress Notes (Signed)
PROGRESS NOTE    Lauren Waller  DUK:[id number] DOB: 1956-01-27 DOA: 10/17/2019 PCP: Jackie Plum, MD   Brief Narrative:  Three 63-year-old lady with prior history of hypertension hyperlipidemia, CVA, type 2 diabetes mellitus, depression and low back pain presents with 2-day of right leg pain and a fall.  X-rays on admission shows right femur communicated intertrochanteric fracture.  Orthopedics consulted and she underwent intramedullary nail placement intertrochanteric area on 10/17/2019.  Orthopedics saw the patient recommended weightbearing as tolerated.  PT and OT evaluations ordered at this time. Assessment & Plan:   Principal Problem:   Intertrochanteric fracture of right femur [facility]) Active Problems:   Diabetes mellitus without complication (HCC)   Hypertension   HLD (hyperlipidemia)   Hypertensive urgency   History of CVA with residual deficit   Hypoalbuminemia   Normocytic anemia   Fall   Right femur communited intertrochanteric fracture S/p intramedullary nail placement by orthopedics on 10/17/2019 Pain controlled and weightbearing as tolerated.  Appreciate orthopedics recommendations. PT/OT evaluations ordered.     Hypertensive urgency Probably secondary to pain Resolved    Essential hypertension Blood pressure parameters are improving and close to normal.    Hypokalemia Replaced repeat in the morning.    History of CVA in August 21, 2019. S/p residual memory deficits and dysarthria present. Plan to restart aspirin if okay with orthopedics.   Hyperlipidemia Continue with statin    History of cervical spinal stenosis and degenerative disc disease Pain control and PT evaluation.   Type 2 diabetes mellitus Hold oral hypoglycemic medications. CBG (last 3)  Recent Labs    10/17/19 2142 10/18/19 0650 10/18/19 1144  GLUCAP 251* 288* 353*    Get hemoglobin A1c, Restart Lantus and sliding scale insulin    Anemia of acute  blood loss ? From surgery.  Stool for occult blood is negative.  Get anemia panel.  Hemoglobin at baseline around 12. Dropped to 8.  Transfuse to keep hemoglobin greater than 7.  Continue to monitor.     DVT prophylaxis: Lovenox Code Status: Full code Family Communication: None at bedside Disposition Plan: Pending PT evaluation Consultants:   Orthopedics  Procedures: Intramedullary nail placement on 10/17/2019 Antimicrobials: None Subjective: Pain controlled  Objective: Vitals:   10/17/19 1715 10/17/19 1951 10/18/19 0434 10/18/19 0734  BP: (!) 180/93 (!) 142/76 (!) 141/76 (!) 121/58  Pulse: 68 79 77 72  Resp: 11 17 18 18   Temp: 97.7 F (36.5 C) 98.6 F (37 C) 99 F (37.2 C) 98.5 F (36.9 C)  TempSrc:  Oral Oral Oral  SpO2: 100% 99% 97% 97%  Height:        Intake/Output Summary (Last 24 hours) at 10/18/2019 1241 Last data filed at 10/18/2019 1106 Gross per 24 hour  Intake 1685.34 ml  Output 550 ml  Net 1135.34 ml   There were no vitals filed for this visit.  Examination:  General exam: Appears calm and comfortable  Respiratory system: Clear to auscultation. Respiratory effort normal. Cardiovascular system: S1 & S2 heard, RRR. No JVD,  Gastrointestinal system: Abdomen is nondistended, soft and nontender. No organomegaly or masses felt. Normal bowel sounds heard. Central nervous system: Alert and oriented. No focal neurological deficits. Extremities: Symmetric 5 x 5 power.  Skin: No rashes, lesions or ulcers Psychiatry:  Mood & affect appropriate.     Data Reviewed: I have personally reviewed following labs and imaging studies  CBC: Recent Labs  Lab 10/17/19 0339 10/18/19 0329  WBC 9.4 12.0*  NEUTROABS 6.9  --  HGB 8.6* 8.1*  HCT 27.4* 25.8*  MCV 88.1 87.2  PLT 325 365   Basic Metabolic Panel: Recent Labs  Lab 10/17/19 0339 10/18/19 0329  NA 140 136  K 3.4* 4.5  CL 101 102  CO2 29 24  GLUCOSE 138* 313*  BUN 23 24*  CREATININE 0.95  0.73  CALCIUM 9.2 8.6*   GFR: CrCl cannot be calculated (Unknown ideal weight.). Liver Function Tests: Recent Labs  Lab 10/17/19 0339  AST 27  ALT 23  ALKPHOS 74  BILITOT 0.9  PROT 6.4*  ALBUMIN 2.8*   No results for input(s): LIPASE, AMYLASE in the last 168 hours. No results for input(s): AMMONIA in the last 168 hours. Coagulation Profile: Recent Labs  Lab 10/17/19 0339  INR 1.0   Cardiac Enzymes: No results for input(s): CKTOTAL, CKMB, CKMBINDEX, TROPONINI in the last 168 hours. BNP (last 3 results) No results for input(s): PROBNP in the last 8760 hours. HbA1C: Recent Labs    10/17/19 0730  HGBA1C 8.2*   CBG: Recent Labs  Lab 10/17/19 1321 10/17/19 1634 10/17/19 2142 10/18/19 0650 10/18/19 1144  GLUCAP 103* 118* 251* 288* 353*   Lipid Profile: No results for input(s): CHOL, HDL, LDLCALC, TRIG, CHOLHDL, LDLDIRECT in the last 72 hours. Thyroid Function Tests: No results for input(s): TSH, T4TOTAL, FREET4, T3FREE, THYROIDAB in the last 72 hours. Anemia Panel: No results for input(s): VITAMINB12, FOLATE, FERRITIN, TIBC, IRON, RETICCTPCT in the last 72 hours. Sepsis Labs: No results for input(s): PROCALCITON, LATICACIDVEN in the last 168 hours.  Recent Results (from the past 240 hour(s))  SARS CORONAVIRUS 2 (TAT 6-24 HRS) Nasopharyngeal Nasopharyngeal Swab     Status: None   Collection Time: 10/17/19  3:24 AM   Specimen: Nasopharyngeal Swab  Result Value Ref Range Status   SARS Coronavirus 2 NEGATIVE NEGATIVE Final    Comment: (NOTE) SARS-CoV-2 target nucleic acids are NOT DETECTED. The SARS-CoV-2 RNA is generally detectable in upper and lower respiratory specimens during the acute phase of infection. Negative results do not preclude SARS-CoV-2 infection, do not rule out co-infections with other pathogens, and should not be used as the sole basis for treatment or other patient management decisions. Negative results must be combined with clinical  observations, patient history, and epidemiological information. The expected result is Negative. Fact Sheet for Patients: HairSlick.no Fact Sheet for Healthcare Providers: quierodirigir.com This test is not yet approved or cleared by the [place] FDA and  has been authorized for detection and/or diagnosis of SARS-CoV-2 by FDA under an Emergency Use Authorization (EUA). This EUA will remain  in effect (meaning this test can be used) for the duration of the COVID-19 declaration under Section 56 4(b)(1) of the Act, 21 U.S.C. section 360bbb-3(b)(1), unless the authorization is terminated or revoked sooner. Performed at [facility], [address] [place], [place] [address]   Surgical pcr screen     Status: None   Collection Time: 10/17/19  1:24 PM   Specimen: Nasal Mucosa; Nasal Swab  Result Value Ref Range Status   MRSA, PCR NEGATIVE NEGATIVE Final   Staphylococcus aureus NEGATIVE NEGATIVE Final    Comment: (NOTE) The Xpert SA Assay (FDA approved for NASAL specimens in patients 56 years of age and older), is one component of a comprehensive surveillance program. It is not intended to diagnose infection nor to guide or monitor treatment. Performed at [facility], [address] [place], [place] [address]  Radiology Studies: DG Chest 1 View  Result Date: 10/17/2019 CLINICAL DATA:  63 year old female status post fall with hip deformity. EXAM: CHEST  1 VIEW COMPARISON:  Portable chest 08/02/2019. FINDINGS: AP view at 0538 hours. Stable lung volumes and mediastinal contours. Cardiac size at the upper limits of normal. Visualized tracheal air column is within normal limits. Skin fold artifact in the right lung. No pneumothorax identified. Allowing for portable technique the lungs are clear. Negative visible bowel gas pattern. Nipple shadows in the upper abdomen. No acute osseous abnormality identified.  IMPRESSION: No acute cardiopulmonary abnormality. Electronically Signed   By: Genevie Ann M.D.   On: 10/17/2019 06:13   CT HEAD WO CONTRAST  Result Date: 10/17/2019 CLINICAL DATA:  63 year old female status post fall with right femur fracture. EXAM: CT HEAD WITHOUT CONTRAST TECHNIQUE: Contiguous axial images were obtained from the base of the skull through the vertex without intravenous contrast. COMPARISON:  Brain MRI 08/02/2019. FINDINGS: Brain: Sequelae of left cerebellar infarct seen in October with encephalomalacia now. Superimposed chronic lacunar infarcts in the deep gray nuclei including the right thalamus. Stable cerebral volume. No midline shift, ventriculomegaly, mass effect, evidence of mass lesion, intracranial hemorrhage or evidence of cortically based acute infarction. Vascular: Mild Calcified atherosclerosis at the skull base. No suspicious intracranial vascular hyperdensity. Skull: Negative. Sinuses/Orbits: Mild maxillary and ethmoid sinus mucosal thickening is new since October. Tympanic cavities and mastoids remain clear. Other: No acute orbit or scalp soft tissue findings. IMPRESSION: 1. No acute traumatic injury or acute intracranial abnormality identified. 2. 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 Underlying chronic small vessel disease. Electronically Signed   By: Genevie Ann M.D.   On: 10/17/2019 06:21   CT CERVICAL SPINE WO CONTRAST  Result Date: 10/17/2019 CLINICAL DATA:  63 year old female status post fall with right femur fracture. EXAM: CT CERVICAL SPINE WITHOUT CONTRAST TECHNIQUE: Multidetector CT imaging of the cervical spine was performed without intravenous contrast. Multiplanar CT image reconstructions were also generated. COMPARISON:  08/02/2019. FINDINGS: Alignment: Straightening of cervical lordosis has not significantly changed. Cervicothoracic junction alignment is within normal limits. Bilateral posterior element alignment is within normal limits.  Skull base and vertebrae: Visualized skull base is intact. No atlanto-occipital dissociation. No acute osseous abnormality identified. A small round lucency in the posterior C3 vertebral body is stable on series 7, image 33 and likely benign. Soft tissues and spinal canal: No prevertebral fluid or swelling. No visible canal hematoma. 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 This contributes to multifactorial widespread cervical spinal stenosis which is moderate at multiple levels, and possibly severe at C5-C6. Superimposed bulky cervical endplate spurring. No cervical ankylosis, although there does appear to be bridging bone at the T1-T2 level. Upper chest: Visible upper thoracic levels appear intact. Negative lung apices. IMPRESSION: 1. No acute traumatic injury identified in the cervical spine. 2. 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 If there are myelopathic symptoms note that this degree of stenosis could predispose to spinal cord injury in a fall. Electronically Signed   By: Genevie Ann M.D.   On: 10/17/2019 06:26   DG C-Arm 1-60 Min  Result Date: 10/17/2019 CLINICAL DATA:  Intertrochanteric right femur fracture. ORIF. EXAM: DG C-ARM 1-60 MIN; RIGHT FEMUR 2 VIEWS COMPARISON:  Radiographs earlier the same date. FINDINGS: C-arm fluoroscopy was provided  in the operating room. 50 seconds of fluoroscopy time. Four spot fluoroscopic images are submitted, demonstrating the placement of a right femoral dynamic screw, intramedullary rod and distal interlocking screw. The hardware appears well positioned. There is improved alignment of the comminuted intertrochanteric right femur fracture. No demonstrated complications. IMPRESSION: Intraoperative views during ORIF of the right femur fracture. Electronically  Signed   By: Carey BullocksWilliam  Veazey M.D.   On: 10/17/2019 18:24   DG Hip Unilat With Pelvis 2-3 Views Right  Result Date: 10/17/2019 CLINICAL DATA:  63 year old female status post fall with hip deformity. EXAM: DG HIP (WITH OR WITHOUT PELVIS) 2-3V RIGHT COMPARISON:  None. FINDINGS: Comminuted intertrochanteric fracture of the proximal right femur with mild varus impaction. The right femoral head remains normally located. No superimposed acute pelvis fracture identified. Grossly intact proximal left femur. Previous left inguinal region hernia repair with mesh. Negative visible bowel gas pattern. IMPRESSION: 1. Right femur comminuted intertrochanteric fracture with mild varus impaction. 2. No superimposed pelvis fracture identified. Electronically Signed   By: Odessa FlemingH  Hall M.D.   On: 10/17/2019 06:14   DG FEMUR, MIN 2 VIEWS RIGHT  Result Date: 10/17/2019 CLINICAL DATA:  Intertrochanteric right femur fracture. ORIF. EXAM: DG C-ARM 1-60 MIN; RIGHT FEMUR 2 VIEWS COMPARISON:  Radiographs earlier the same date. FINDINGS: C-arm fluoroscopy was provided in the operating room. 50 seconds of fluoroscopy time. Four spot fluoroscopic images are submitted, demonstrating the placement of a right femoral dynamic screw, intramedullary rod and distal interlocking screw. The hardware appears well positioned. There is improved alignment of the comminuted intertrochanteric right femur fracture. No demonstrated complications. IMPRESSION: Intraoperative views during ORIF of the right femur fracture. Electronically Signed   By: Carey BullocksWilliam  Veazey M.D.   On: 10/17/2019 18:24        Scheduled Meds: . amLODipine  10 mg Oral Daily  . atenolol  100 mg Oral Daily  . atorvastatin  40 mg Oral Daily  . [START ON 10/19/2019] cloNIDine  0.2 mg Transdermal Weekly  . enoxaparin (LOVENOX) injection  40 mg Subcutaneous Q24H  . feeding supplement (ENSURE ENLIVE)  237 mL Oral BID BM  . hydrALAZINE  100 mg Oral Q8H  . insulin aspart  0-15 Units  Subcutaneous TID WC  . insulin aspart  0-5 Units Subcutaneous QHS  . insulin aspart  2 Units Subcutaneous TID WC  . insulin glargine  20 Units Subcutaneous QHS  . pantoprazole  40 mg Oral Daily  . senna-docusate  1 tablet Oral QHS   Continuous Infusions: . sodium chloride 75 mL/hr at 10/18/19 1106     LOS: 1 day        Kathlen ModyVijaya Shanine Kreiger, MD [facility] 10/18/2019, 12:41 PM

## 2019-10-19 LAB — GLUCOSE, CAPILLARY
Glucose-Capillary: 131 mg/dL — ABNORMAL HIGH (ref 70–99)
Glucose-Capillary: 131 mg/dL — ABNORMAL HIGH (ref 70–99)
Glucose-Capillary: 131 mg/dL — ABNORMAL HIGH (ref 70–99)
Glucose-Capillary: 118 mg/dL — ABNORMAL HIGH (ref 70–99)
Glucose-Capillary: 133 mg/dL — ABNORMAL HIGH (ref 70–99)
Glucose-Capillary: 186 mg/dL — ABNORMAL HIGH (ref 70–99)

## 2019-10-19 MED ORDER — FERROUS SULFATE 325 (65 FE) MG PO TABS
325.0000 mg | ORAL_TABLET | Freq: Three times a day (TID) | ORAL | Status: DC
  Administered 2019-10-19 – 2019-10-24 (×15): 325 mg via ORAL
  Filled 2019-10-19 (×17): qty 1

## 2019-10-19 NOTE — Progress Notes (Signed)
PT Cancellation Note  Patient Details Name: Lauren Waller MRN: [id number] DOB: 09-03-1956   Cancelled Treatment:    Reason Eval/Treat Not Completed: Active bedrest order. Pt currently with two bed rest orders in place and no weight bearing orders. PT will continue to follow acutely and await updated activity orders prior to initiating evaluation.    [place] 10/19/2019, 8:37 AM

## 2019-10-19 NOTE — Progress Notes (Signed)
Subjective: 2 Days Post-Op Procedure(s) (LRB): INTRAMEDULLARY (IM) NAIL INTERTROCHANTRIC (Right) Patient reports pain as 3 on 0-10 scale.   Denies CP or SOB.  Voiding without difficulty. Positive flatus. Objective: Vital signs in last 24 hours: Temp:  [97.7 F (36.5 C)-98.7 F (37.1 C)] 98.1 F (36.7 C) (12/26 0825) Pulse Rate:  [71-78] 71 (12/26 0825) Resp:  [15-18] 16 (12/26 0825) BP: (103-145)/(45-96) 119/45 (12/26 0825) SpO2:  [97 %-99 %] 97 % (12/26 0825)  Intake/Output from previous day: 12/25 0701 - 12/26 0700 In: 380.4 [P.O.:360; I.V.:20.4] Out: 1200 [Urine:1200] Intake/Output this shift: No intake/output data recorded.  Labs: Recent Labs    10/17/19 0339 10/18/19 0329  HGB 8.6* 8.1*   Recent Labs    10/17/19 0339 10/18/19 0329 10/18/19 1341  WBC 9.4 12.0*  --   RBC 3.11* 2.96* 2.76*  HCT 27.4* 25.8*  --   PLT 325 365  --    Recent Labs    10/17/19 0339 10/18/19 0329  NA 140 136  K 3.4* 4.5  CL 101 102  CO2 29 24  BUN 23 24*  CREATININE 0.95 0.73  GLUCOSE 138* 313*  CALCIUM 9.2 8.6*   Recent Labs    10/17/19 0339  INR 1.0    Physical Exam: Neurologically intact ABD soft Dorsiflexion/Plantar flexion intact Incision: dressing C/D/I Body mass index is 29.14 kg/m.   Assessment/Plan: 2 Days Post-Op Procedure(s) (LRB): INTRAMEDULLARY (IM) NAIL INTERTROCHANTRIC (Right) Advance diet Up with therapy  Stable at present.  Will start iron supplement for post-operative anemia.   Dahlia Bailiff for Dr. Melina Schools [facility] [phone] 10/19/2019, 8:39 AM

## 2019-10-19 NOTE — Plan of Care (Signed)

## 2019-10-19 NOTE — Progress Notes (Signed)
PROGRESS NOTE    Lauren Waller  UJW:[id number] DOB: Sep 01, 1956 DOA: 10/17/2019 PCP: Jackie Plumsei-Bonsu, George, MD   Brief Narrative:  Three 63-year-old lady with prior history of hypertension hyperlipidemia, CVA, type 2 diabetes mellitus, depression and low back pain presents with 2-day of right leg pain and a fall.  X-rays on admission shows right femur communicated intertrochanteric fracture.  Orthopedics consulted and she underwent intramedullary nail placement intertrochanteric area on 10/17/2019.  Orthopedics saw the patient recommended weightbearing as tolerated.  PT and OT evaluations ordered at this time. PT seen and examined today, patient reports pain is minimal at this time. Assessment & Plan:   Principal Problem:   Intertrochanteric fracture of right femur [facility](HCC) Active Problems:   Diabetes mellitus without complication (HCC)   Hypertension   HLD (hyperlipidemia)   Hypertensive urgency   History of CVA with residual deficit   Hypoalbuminemia   Normocytic anemia   Fall   Right femur communited intertrochanteric fracture S/p intramedullary nail placement by orthopedics on 10/17/2019 Weightbearing as tolerated recommended by orthopedics Pain controlled and weightbearing as tolerated.  Appreciate orthopedics recommendations. PT/OT evaluations ordered and pending.  DC bedrest.     Hypertensive urgency Probably secondary to pain Resolved    Essential hypertension Well-controlled blood pressure parameters    Hypokalemia Replaced    History of CVA in August 21, 2019. S/p residual memory deficits and dysarthria present. Plan to restart aspirin if okay with orthopedics.   Hyperlipidemia Continue with statin    History of cervical spinal stenosis and degenerative disc disease Pain control and PT evaluation.   Type 2 diabetes mellitus Hold oral hypoglycemic medications.  ABG sporadic controlled. CBG (last 3)  Recent Labs    10/19/19 0631  10/19/19 1220 10/19/19 1621  GLUCAP 131* 131* 118*    IMA globin A1c at 8.2 Continue with Lantus and sliding scale insulin.    Anemia of acute blood loss ? From surgery.  Stool for occult blood is negative.  Anemia panel reviewed iron supplementation to be added. Hemoglobin at baseline around 12. Dropped to 8.  Transfuse to keep hemoglobin greater than 7.  Continue to monitor.     DVT prophylaxis: Lovenox Code Status: Full code Family Communication: None at bedside Disposition Plan: Pending PT evaluation Consultants:   Orthopedics  Procedures: Intramedullary nail placement on 10/17/2019 Antimicrobials: None Subjective: Pain between 2-3, no chest pain or shortness of breath, no nausea or vomiting. Objective: Vitals:   10/19/19 0458 10/19/19 0825 10/19/19 0934 10/19/19 1303  BP: 137/65 (!) 119/45 (!) 141/60 121/65  Pulse: 75 71 73 70  Resp:  16  16  Temp:  98.1 F (36.7 C)  98.9 F (37.2 C)  TempSrc:  Oral  Oral  SpO2:  97%  99%  Height:        Intake/Output Summary (Last 24 hours) at 10/19/2019 1715 Last data filed at 10/19/2019 1330 Gross per 24 hour  Intake 140 ml  Output 1350 ml  Net -1210 ml   There were no vitals filed for this visit.  Examination:  General exam: Well developed, alert and comfortable. Respiratory system: To auscultation bilaterally, no wheezing or rhonchi Cardiovascular system: S1-S2 heard, regular rate rhythm, no JVD Gastrointestinal system: Abdomen is soft, nontender, nondistended, bowel sounds normal Central nervous system: Alert and oriented to place and person. Extremities: No pedal edema, cyanosis or clubbing Skin: No rashes seen Psychiatry: Mood is appropriate    Data Reviewed: I have personally reviewed following labs and imaging studies  CBC:  Recent Labs  Lab 10/17/19 0339 10/18/19 0329  WBC 9.4 12.0*  NEUTROABS 6.9  --   HGB 8.6* 8.1*  HCT 27.4* 25.8*  MCV 88.1 87.2  PLT 325 778   Basic Metabolic  Panel: Recent Labs  Lab 10/17/19 0339 10/18/19 0329  NA 140 136  K 3.4* 4.5  CL 101 102  CO2 29 24  GLUCOSE 138* 313*  BUN 23 24*  CREATININE 0.95 0.73  CALCIUM 9.2 8.6*   GFR: CrCl cannot be calculated (Unknown ideal weight.). Liver Function Tests: Recent Labs  Lab 10/17/19 0339  AST 27  ALT 23  ALKPHOS 74  BILITOT 0.9  PROT 6.4*  ALBUMIN 2.8*   No results for input(s): LIPASE, AMYLASE in the last 168 hours. No results for input(s): AMMONIA in the last 168 hours. Coagulation Profile: Recent Labs  Lab 10/17/19 0339  INR 1.0   Cardiac Enzymes: No results for input(s): CKTOTAL, CKMB, CKMBINDEX, TROPONINI in the last 168 hours. BNP (last 3 results) No results for input(s): PROBNP in the last 8760 hours. HbA1C: Recent Labs    10/17/19 0730  HGBA1C 8.2*   CBG: Recent Labs  Lab 10/18/19 2318 10/19/19 0506 10/19/19 0631 10/19/19 1220 10/19/19 1621  GLUCAP 133* 131* 131* 131* 118*   Lipid Profile: No results for input(s): CHOL, HDL, LDLCALC, TRIG, CHOLHDL, LDLDIRECT in the last 72 hours. Thyroid Function Tests: No results for input(s): TSH, T4TOTAL, FREET4, T3FREE, THYROIDAB in the last 72 hours. Anemia Panel: Recent Labs    10/18/19 1341  VITAMINB12 1,025*  FOLATE 10.9  FERRITIN 283  TIBC 196*  IRON 29  RETICCTPCT 1.8   Sepsis Labs: No results for input(s): PROCALCITON, LATICACIDVEN in the last 168 hours.  Recent Results (from the past 240 hour(s))  SARS CORONAVIRUS 2 (TAT 6-24 HRS) Nasopharyngeal Nasopharyngeal Swab     Status: None   Collection Time: 10/17/19  3:24 AM   Specimen: Nasopharyngeal Swab  Result Value Ref Range Status   SARS Coronavirus 2 NEGATIVE NEGATIVE Final    Comment: (NOTE) SARS-CoV-2 target nucleic acids are NOT DETECTED. The SARS-CoV-2 RNA is generally detectable in upper and lower respiratory specimens during the acute phase of infection. Negative results do not preclude SARS-CoV-2 infection, do not rule  out co-infections with other pathogens, and should not be used as the sole basis for treatment or other patient management decisions. Negative results must be combined with clinical observations, patient history, and epidemiological information. The expected result is Negative. Fact Sheet for Patients: SugarRoll.be Fact Sheet for Healthcare Providers: https://www.woods-mathews.com/ This test is not yet approved or cleared by the [place] FDA and  has been authorized for detection and/or diagnosis of SARS-CoV-2 by FDA under an Emergency Use Authorization (EUA). This EUA will remain  in effect (meaning this test can be used) for the duration of the COVID-19 declaration under Section 56 4(b)(1) of the Act, 21 U.S.C. section 360bbb-3(b)(1), unless the authorization is terminated or revoked sooner. Performed at [facility], [place] [address], [place], [place] [address]   Surgical pcr screen     Status: None   Collection Time: 10/17/19  1:24 PM   Specimen: Nasal Mucosa; Nasal Swab  Result Value Ref Range Status   MRSA, PCR NEGATIVE NEGATIVE Final   Staphylococcus aureus NEGATIVE NEGATIVE Final    Comment: (NOTE) The Xpert SA Assay (FDA approved for NASAL specimens in patients 7 years of age and older), is one component of a comprehensive surveillance program. It is not  intended to diagnose infection nor to guide or monitor treatment. Performed at [facility], [address], [place], [place] [address]          Radiology Studies: No results found.      Scheduled Meds: . amLODipine  10 mg Oral Daily  . atenolol  100 mg Oral Daily  . atorvastatin  40 mg Oral Daily  . cloNIDine  0.2 mg Transdermal Weekly  . enoxaparin (LOVENOX) injection  40 mg Subcutaneous Q24H  . feeding supplement (ENSURE ENLIVE)  237 mL Oral BID BM  . ferrous sulfate  325 mg Oral TID WC  . hydrALAZINE  100 mg Oral Q8H  . insulin aspart  0-15  Units Subcutaneous TID WC  . insulin aspart  0-5 Units Subcutaneous QHS  . insulin aspart  2 Units Subcutaneous TID WC  . insulin glargine  20 Units Subcutaneous QHS  . pantoprazole  40 mg Oral Daily  . senna-docusate  1 tablet Oral QHS   Continuous Infusions: . sodium chloride 75 mL/hr at 10/19/19 1245     LOS: 2 days        Kathlen Mody, MD [facility] 10/19/2019, 5:15 PM

## 2019-10-20 LAB — GLUCOSE, CAPILLARY
Glucose-Capillary: 119 mg/dL — ABNORMAL HIGH (ref 70–99)
Glucose-Capillary: 142 mg/dL — ABNORMAL HIGH (ref 70–99)
Glucose-Capillary: 149 mg/dL — ABNORMAL HIGH (ref 70–99)

## 2019-10-20 NOTE — Progress Notes (Signed)
Subjective: 3 Days Post-Op Procedure(s) (LRB): INTRAMEDULLARY (IM) NAIL INTERTROCHANTRIC (Right) Patient reports pain as mild.   Denies CP, SOB, N/V, N/T.  Able to void without difficulty Positive Flatus  Objective: Vital signs in last 24 hours: Temp:  [97.4 F (36.3 C)-99.2 F (37.3 C)] 98.5 F (36.9 C) (12/27 0801) Pulse Rate:  [70-77] 74 (12/27 0801) Resp:  [16] 16 (12/27 0801) BP: (121-145)/(60-66) 139/61 (12/27 0801) SpO2:  [96 %-99 %] 98 % (12/27 0801)  Intake/Output from previous day: 12/26 0701 - 12/27 0700 In: 140 [P.O.:140] Out: 650 [Urine:650] Intake/Output this shift: No intake/output data recorded.  Recent Labs    10/18/19 0329  HGB 8.1*   Recent Labs    10/18/19 0329 10/18/19 1341  WBC 12.0*  --   RBC 2.96* 2.76*  HCT 25.8*  --   PLT 365  --    Recent Labs    10/18/19 0329  NA 136  K 4.5  CL 102  CO2 24  BUN 24*  CREATININE 0.73  GLUCOSE 313*  CALCIUM 8.6*   No results for input(s): LABPT, INR in the last 72 hours.  Neurologically intact ABD soft Neurovascular intact Sensation intact distally Intact pulses distally Dorsiflexion/Plantar flexion intact Incision: dressing C/D/I   Assessment/Plan: 3 Days Post-Op Procedure(s) (LRB): INTRAMEDULLARY (IM) NAIL INTERTROCHANTRIC (Right) Advance diet Up with therapy, WBAT right lower extremity DVT prophylaxis  Drue Novel, PA-C Orthopedic Surgery [phone] 10/20/2019, 8:33 AM

## 2019-10-20 NOTE — Evaluation (Signed)
Occupational Therapy Evaluation Patient Details Name: Lauren Waller MRN: [id number] DOB: Mar 18, 1956 Today's Date: 10/20/2019    History of Present Illness Pt is a 63 year old woman admitted with R intertrochanteric hip fx on 10/17/19 sustained in a fall at her nursing facility. She underwent IM nail. PMH: HTN, CVA, DM, depression, chronic back pain.   Clinical Impression   Pt is a resident of a nursing facility who is a poor historian. Pt was likely dependent in bathing, dressing and toileting, but was able to self feed. Unclear of pt's prior mobility status. Pt presents with R LE pain, generalized weakness and poor standing balance, requiring 2 person assist for all mobility. She demonstrated ability to self feed with min assist and required max assist to wash her hand prior to eating. Recommending further rehab in SNF upon discharge. Will follow acutely.    Follow Up Recommendations  SNF;Supervision/Assistance - 24 hour    Equipment Recommendations  Other (comment)(defer to next venue)    Recommendations for Other Services       Precautions / Restrictions Precautions Precautions: Fall Restrictions Weight Bearing Restrictions: Yes RLE Weight Bearing: Weight bearing as tolerated      Mobility Bed Mobility Overal bed mobility: Needs Assistance Bed Mobility: Supine to Sit     Supine to sit: +2 for physical assistance;Total assist     General bed mobility comments: assisted using bed pad to rotate hips to EOB with support of R LE and assist to raise trunk  Transfers Overall transfer level: Needs assistance Equipment used: Rolling walker (2 wheeled) Transfers: Sit to/from [employer] Transfers Sit to Stand: +2 physical assistance;Max assist Stand pivot transfers: +2 physical assistance;Max assist       General transfer comment: pt pulled up on walker, assist to rise and steady, pt with minimal ability to advance R LE to pivot to chair    Balance Overall  balance assessment: Needs assistance   Sitting balance-Leahy Scale: Fair       Standing balance-Leahy Scale: Poor Standing balance comment: requires B UE support and external support of 2                           ADL either performed or assessed with clinical judgement   ADL Overall ADL's : Needs assistance/impaired Eating/Feeding: Minimal assistance;Sitting   Grooming: Maximal assistance;Wash/dry hands;Sitting   Upper Body Bathing: Total assistance;Sitting   Lower Body Bathing: Total assistance;+2 for physical assistance;Sit to/from stand   Upper Body Dressing : Maximal assistance;Sitting   Lower Body Dressing: Total assistance;+2 for physical assistance;Sit to/from stand   Toilet Transfer: +2 for physical assistance;Maximal assistance;Stand-pivot;BSC;RW   Toileting- Clothing Manipulation and Hygiene: +2 for physical assistance;Total assistance;Sit to/from stand               Vision Patient Visual Report: No change from baseline       Perception     Praxis      Pertinent Vitals/Pain Pain Assessment: Faces Faces Pain Scale: Hurts little more Pain Location: R LE Pain Descriptors / Indicators: Moaning Pain Intervention(s): Monitored during session;Repositioned     Hand Dominance Right   Extremity/Trunk Assessment Upper Extremity Assessment Upper Extremity Assessment: Generalized weakness   Lower Extremity Assessment Lower Extremity Assessment: Defer to PT evaluation   Cervical / Trunk Assessment Cervical / Trunk Assessment: Other exceptions;Kyphotic Cervical / Trunk Exceptions: forward head   Communication Communication Communication: Expressive difficulties   Cognition Arousal/Alertness: Awake/alert Behavior During Therapy: Flat  affect Overall Cognitive Status: No family/caregiver present to determine baseline cognitive functioning                                 General Comments: pt with h/o impaired cognition per  chart   General Comments       Exercises     Shoulder Instructions      Home Living Family/patient expects to be discharged to:: Skilled nursing facility                                 Additional Comments: per chart pt resides at [facility]      Prior Functioning/Environment          Comments: pt is a poor historian        OT Problem List: Decreased strength;Decreased activity tolerance;Impaired balance (sitting and/or standing);Decreased cognition;Decreased safety awareness;Decreased knowledge of use of DME or AE;Pain      OT Treatment/Interventions: Self-care/ADL training;DME and/or AE instruction;Patient/family education;Balance training;Therapeutic activities    OT Goals(Current goals can be found in the care plan section) Acute Rehab OT Goals Patient Stated Goal: pt unable to state OT Goal Formulation: Patient unable to participate in goal setting Time For Goal Achievement: 11/03/19 Potential to Achieve Goals: Fair ADL Goals Pt Will Transfer to Toilet: with mod assist;squat pivot transfer;bedside commode Additional ADL Goal #1: Pt will perform bed mobility with moderate assistance in preparation for ADL.  OT Frequency: Min 2X/week   Barriers to D/C:            Co-evaluation PT/OT/SLP Co-Evaluation/Treatment: Yes Reason for Co-Treatment: For patient/therapist safety   OT goals addressed during session: Proper use of Adaptive equipment and DME;ADL's and self-care      AM-PAC OT "6 Clicks" Daily Activity     Outcome Measure Help from another person eating meals?: A Little Help from another person taking care of personal grooming?: A Lot Help from another person toileting, which includes using toliet, bedpan, or urinal?: Total Help from another person bathing (including washing, rinsing, drying)?: Total Help from another person to put on and taking off regular upper body clothing?: A Lot Help from another person to put on and  taking off regular lower body clothing?: Total 6 Click Score: 10   End of Session Equipment Utilized During Treatment: Gait belt;Rolling walker Nurse Communication: Mobility status  Activity Tolerance: Patient tolerated treatment well Patient left: in chair;with call bell/phone within reach;with chair alarm set  OT Visit Diagnosis: Unsteadiness on feet (R26.81);Pain;Muscle weakness (generalized) (M62.81);Other symptoms and signs involving cognitive function Pain - Right/Left: Right Pain - part of body: Hip                Time: 1250-1312 OT Time Calculation (min): 22 min Charges:  OT General Charges $OT Visit: 1 Visit OT Evaluation $OT Eval Moderate Complexity: 1 Mod  Lauren Waller, OTR/L Acute Rehabilitation Services Pager: [phone] Office: [phone]  Evern Bio 10/20/2019, 2:36 PM

## 2019-10-20 NOTE — Plan of Care (Signed)

## 2019-10-20 NOTE — Plan of Care (Signed)

## 2019-10-20 NOTE — Evaluation (Signed)
Physical Therapy Evaluation Patient Details Name: Lauren Waller MRN: [id number] DOB: 1956-08-18 Today's Date: 10/20/2019   History of Present Illness  Pt is a 63 year old woman admitted with R intertrochanteric hip fx on 10/17/19 sustained in a fall at her nursing facility. She underwent IM nail. PMH: HTN, CVA, DM, depression, chronic back pain.  Clinical Impression   Patient is s/p above surgery resulting in functional limitations due to the deficits listed below (see PT Problem List). Pt is a resident of a nursing facility who is a poor historian. Pt was likely dependent in bathing, dressing and toileting, but was able to self feed. Unclear of pt's prior mobility status. Pt presents with R LE pain, generalized weakness and poor standing balance, requiring 2 person assist for all mobility. Patient will benefit from skilled PT to increase their independence and safety with mobility to allow discharge to the venue listed below.       Follow Up Recommendations SNF    Equipment Recommendations  Rolling walker with 5" wheels;3in1 (PT)    Recommendations for Other Services       Precautions / Restrictions Precautions Precautions: Fall Restrictions Weight Bearing Restrictions: Yes RLE Weight Bearing: Weight bearing as tolerated      Mobility  Bed Mobility Overal bed mobility: Needs Assistance Bed Mobility: Supine to Sit     Supine to sit: +2 for physical assistance;Total assist     General bed mobility comments: assisted using bed pad to rotate hips to EOB with support of R LE and assist to raise trunk  Transfers Overall transfer level: Needs assistance Equipment used: Rolling walker (2 wheeled) Transfers: Sit to/from [employer] Transfers Sit to Stand: +2 physical assistance;Max assist Stand pivot transfers: +2 physical assistance;Max assist       General transfer comment: pt pulled up on walker, assist to rise and steady, pt with minimal ability to advance R  LE to pivot to chair  Ambulation/Gait                Stairs            Wheelchair Mobility    Modified Rankin (Stroke Patients Only)       Balance Overall balance assessment: Needs assistance   Sitting balance-Leahy Scale: Fair       Standing balance-Leahy Scale: Poor Standing balance comment: requires B UE support and external support of 2                             Pertinent Vitals/Pain Pain Assessment: Faces Faces Pain Scale: Hurts little more Pain Location: R LE Pain Descriptors / Indicators: Moaning Pain Intervention(s): Monitored during session;Repositioned    Home Living Family/patient expects to be discharged to:: Skilled nursing facility                 Additional Comments: per chart pt resides at [facility]    Prior Function           Comments: pt is a poor historian     Hand Dominance   Dominant Hand: Right    Extremity/Trunk Assessment   Upper Extremity Assessment Upper Extremity Assessment: Generalized weakness    Lower Extremity Assessment Lower Extremity Assessment: Generalized weakness;RLE deficits/detail;LLE deficits/detail RLE Deficits / Details: Grossly decr AROM and strength, limited by pain RLE: Unable to fully assess due to pain LLE Deficits / Details: Noting active movement hip, knee and ankle, including weight bearing and standing response  with standing trial    Cervical / Trunk Assessment Cervical / Trunk Assessment: Other exceptions;Kyphotic Cervical / Trunk Exceptions: forward head  Communication   Communication: Expressive difficulties  Cognition Arousal/Alertness: Awake/alert Behavior During Therapy: Flat affect Overall Cognitive Status: No family/caregiver present to determine baseline cognitive functioning                                 General Comments: pt with h/o impaired cognition per chart      General Comments      Exercises      Assessment/Plan    PT Assessment Patient needs continued PT services  PT Problem List Decreased strength;Decreased range of motion;Decreased activity tolerance;Decreased balance;Decreased mobility;Decreased coordination;Decreased cognition;Decreased knowledge of use of DME;Decreased safety awareness;Decreased knowledge of precautions       PT Treatment Interventions DME instruction;Gait training;Functional mobility training;Therapeutic activities;Therapeutic exercise;Balance training;Patient/family education    PT Goals (Current goals can be found in the Care Plan section)  Acute Rehab PT Goals Patient Stated Goal: pt unable to state PT Goal Formulation: Patient unable to participate in goal setting Time For Goal Achievement: 11/03/19 Potential to Achieve Goals: Fair    Frequency Min 2X/week   Barriers to discharge        Co-evaluation   Reason for Co-Treatment: For patient/therapist safety   OT goals addressed during session: Proper use of Adaptive equipment and DME;ADL's and self-care       AM-PAC PT "6 Clicks" Mobility  Outcome Measure Help needed turning from your back to your side while in a flat bed without using bedrails?: Total Help needed moving from lying on your back to sitting on the side of a flat bed without using bedrails?: Total Help needed moving to and from a bed to a chair (including a wheelchair)?: Total Help needed standing up from a chair using your arms (e.g., wheelchair or bedside chair)?: Total Help needed to walk in hospital room?: Total Help needed climbing 3-5 steps with a railing? : Total 6 Click Score: 6    End of Session Equipment Utilized During Treatment: Gait belt Activity Tolerance: Other (comment)(Limited participation) Patient left: in chair;with call bell/phone within reach;with chair alarm set Nurse Communication: Mobility status PT Visit Diagnosis: Unsteadiness on feet (R26.81);Other abnormalities of gait and mobility  (R26.89);History of falling (Z91.81)    Time: 1250-1314 PT Time Calculation (min) (ACUTE ONLY): 24 min   Charges:   PT Evaluation $PT Eval Moderate Complexity: 1 Mod          Roney [place], [place]  Acute Rehabilitation Services Pager [phone] Office [phone]   Colletta [place] 10/20/2019, 4:49 PM

## 2019-10-20 NOTE — Progress Notes (Signed)
PROGRESS NOTE    Lauren Waller  TDV:[id number] DOB: 1956-09-25 DOA: 10/17/2019 PCP: Jackie Plum, MD   Brief Narrative:  Three 63-year-old lady with prior history of hypertension hyperlipidemia, CVA, type 2 diabetes mellitus, depression and low back pain presents with 2-day of right leg pain and a fall.  X-rays on admission shows right femur communicated intertrochanteric fracture.  Orthopedics consulted and she underwent intramedullary nail placement intertrochanteric area on 10/17/2019.  Orthopedics saw the patient recommended weightbearing as tolerated.  PT and OT evaluations ordered at this time. PT seen and examined today, patient reports pain is minimal at this time. Assessment & Plan:   Principal Problem:   Intertrochanteric fracture of right femur [facility]) Active Problems:   Diabetes mellitus without complication (HCC)   Hypertension   HLD (hyperlipidemia)   Hypertensive urgency   History of CVA with residual deficit   Hypoalbuminemia   Normocytic anemia   Fall   Right femur communited intertrochanteric fracture S/p intramedullary nail placement by orthopedics on 10/17/2019 Weightbearing as tolerated recommended by orthopedics Pain controlled and weightbearing as tolerated.  Appreciate orthopedics recommendations. PT/OT evaluations recommends SNF     Hypertensive urgency Probably secondary to pain Resolved    Essential hypertension Well-controlled blood pressure parameters    Hypokalemia Replaced    History of CVA in August 21, 2019. S/p residual memory deficits and dysarthria present. Plan to restart aspirin if okay with orthopedics.   Hyperlipidemia Continue with statin    History of cervical spinal stenosis and degenerative disc disease Pain control and PT evaluation.   Type 2 diabetes mellitus Hold oral hypoglycemic medications.  ABG sporadic controlled. CBG (last 3)  Recent Labs    10/20/19 0642 10/20/19 1132 10/20/19 1637   GLUCAP 119* 142* 149*    IMA globin A1c at 8.2 Continue with Lantus and sliding scale insulin.    Anemia of acute blood loss ? From surgery.  Stool for occult blood is negative.  Anemia panel reviewed iron supplementation to be added. Hemoglobin at baseline around 12. Dropped to 8.  Transfuse to keep hemoglobin greater than 7.  Continue to monitor.     DVT prophylaxis: Lovenox Code Status: Full code Family Communication: None at bedside Disposition Plan: Pending PT evaluation Consultants:   Orthopedics  Procedures: Intramedullary nail placement on 10/17/2019 Antimicrobials: None Subjective: Patient sleepy.  Answering question yes or no.  No nausea or vomiting. Pain well controlled.  Objective: Vitals:   10/19/19 1953 10/20/19 0331 10/20/19 0801 10/20/19 1335  BP: 131/63 (!) 145/66 139/61 (!) 125/59  Pulse: 73 77 74 71  Resp:   16 14  Temp: 99.2 F (37.3 C) (!) 97.4 F (36.3 C) 98.5 F (36.9 C) 98 F (36.7 C)  TempSrc: Oral Oral Oral Oral  SpO2: 96% 98% 98% 99%  Height:        Intake/Output Summary (Last 24 hours) at 10/20/2019 1854 Last data filed at 10/20/2019 1330 Gross per 24 hour  Intake 0 ml  Output --  Net 0 ml   There were no vitals filed for this visit.  Examination:  General exam: Well developed, alert and comfortable. Respiratory system: To auscultation bilaterally, no wheezing or rhonchi Cardiovascular system: S1-S2 heard, regular rate rhythm, no JVD Gastrointestinal system: Abdomen is soft, nontender, nondistended, bowel sounds normal Central nervous system: Alert and oriented to place and person. Extremities: No pedal edema, cyanosis or clubbing Skin: No rashes seen Psychiatry: Mood is appropriate    Data Reviewed: I have personally reviewed following labs  and imaging studies  CBC: Recent Labs  Lab 10/17/19 0339 10/18/19 0329  WBC 9.4 12.0*  NEUTROABS 6.9  --   HGB 8.6* 8.1*  HCT 27.4* 25.8*  MCV 88.1 87.2  PLT 325 365    Basic Metabolic Panel: Recent Labs  Lab 10/17/19 0339 10/18/19 0329  NA 140 136  K 3.4* 4.5  CL 101 102  CO2 29 24  GLUCOSE 138* 313*  BUN 23 24*  CREATININE 0.95 0.73  CALCIUM 9.2 8.6*   GFR: CrCl cannot be calculated (Unknown ideal weight.). Liver Function Tests: Recent Labs  Lab 10/17/19 0339  AST 27  ALT 23  ALKPHOS 74  BILITOT 0.9  PROT 6.4*  ALBUMIN 2.8*   No results for input(s): LIPASE, AMYLASE in the last 168 hours. No results for input(s): AMMONIA in the last 168 hours. Coagulation Profile: Recent Labs  Lab 10/17/19 0339  INR 1.0   Cardiac Enzymes: No results for input(s): CKTOTAL, CKMB, CKMBINDEX, TROPONINI in the last 168 hours. BNP (last 3 results) No results for input(s): PROBNP in the last 8760 hours. HbA1C: No results for input(s): HGBA1C in the last 72 hours. CBG: Recent Labs  Lab 10/19/19 1717 10/19/19 2048 10/20/19 0642 10/20/19 1132 10/20/19 1637  GLUCAP 133* 186* 119* 142* 149*   Lipid Profile: No results for input(s): CHOL, HDL, LDLCALC, TRIG, CHOLHDL, LDLDIRECT in the last 72 hours. Thyroid Function Tests: No results for input(s): TSH, T4TOTAL, FREET4, T3FREE, THYROIDAB in the last 72 hours. Anemia Panel: Recent Labs    10/18/19 1341  VITAMINB12 1,025*  FOLATE 10.9  FERRITIN 283  TIBC 196*  IRON 29  RETICCTPCT 1.8   Sepsis Labs: No results for input(s): PROCALCITON, LATICACIDVEN in the last 168 hours.  Recent Results (from the past 240 hour(s))  SARS CORONAVIRUS 2 (TAT 6-24 HRS) Nasopharyngeal Nasopharyngeal Swab     Status: None   Collection Time: 10/17/19  3:24 AM   Specimen: Nasopharyngeal Swab  Result Value Ref Range Status   SARS Coronavirus 2 NEGATIVE NEGATIVE Final    Comment: (NOTE) SARS-CoV-2 target nucleic acids are NOT DETECTED. The SARS-CoV-2 RNA is generally detectable in upper and lower respiratory specimens during the acute phase of infection. Negative results do not preclude SARS-CoV-2 infection,  do not rule out co-infections with other pathogens, and should not be used as the sole basis for treatment or other patient management decisions. Negative results must be combined with clinical observations, patient history, and epidemiological information. The expected result is Negative. Fact Sheet for Patients: HairSlick.nohttps://www.fda.gov/media/138098/download Fact Sheet for Healthcare Providers: quierodirigir.comhttps://www.fda.gov/media/138095/download This test is not yet approved or cleared by the [place] FDA and  has been authorized for detection and/or diagnosis of SARS-CoV-2 by FDA under an Emergency Use Authorization (EUA). This EUA will remain  in effect (meaning this test can be used) for the duration of the COVID-19 declaration under Section 56 4(b)(1) of the Act, 21 U.S.C. section 360bbb-3(b)(1), unless the authorization is terminated or revoked sooner. Performed at [facility], [address] [place], [place] [address]   Surgical pcr screen     Status: None   Collection Time: 10/17/19  1:24 PM   Specimen: Nasal Mucosa; Nasal Swab  Result Value Ref Range Status   MRSA, PCR NEGATIVE NEGATIVE Final   Staphylococcus aureus NEGATIVE NEGATIVE Final    Comment: (NOTE) The Xpert SA Assay (FDA approved for NASAL specimens in patients 62 years of age and older), is one component of a comprehensive surveillance program.  It is not intended to diagnose infection nor to guide or monitor treatment. Performed at [facility], [place] [address] [place], [place] [address]          Radiology Studies: No results found.      Scheduled Meds: . amLODipine  10 mg Oral Daily  . atenolol  100 mg Oral Daily  . atorvastatin  40 mg Oral Daily  . cloNIDine  0.2 mg Transdermal Weekly  . enoxaparin (LOVENOX) injection  40 mg Subcutaneous Q24H  . feeding supplement (ENSURE ENLIVE)  237 mL Oral BID BM  . ferrous sulfate  325 mg Oral TID WC  . hydrALAZINE  100 mg Oral Q8H  . insulin  aspart  0-15 Units Subcutaneous TID WC  . insulin aspart  0-5 Units Subcutaneous QHS  . insulin aspart  2 Units Subcutaneous TID WC  . insulin glargine  20 Units Subcutaneous QHS  . pantoprazole  40 mg Oral Daily  . senna-docusate  1 tablet Oral QHS   Continuous Infusions: . sodium chloride 75 mL/hr at 10/20/19 1351     LOS: 3 days        Berle Mull, MD [facility] 10/20/2019, 6:54 PM

## 2019-10-21 ENCOUNTER — Encounter: Payer: Self-pay | Admitting: *Deleted

## 2019-10-21 LAB — GLUCOSE, CAPILLARY
Glucose-Capillary: 85 mg/dL (ref 70–99)
Glucose-Capillary: 123 mg/dL — ABNORMAL HIGH (ref 70–99)
Glucose-Capillary: 165 mg/dL — ABNORMAL HIGH (ref 70–99)
Glucose-Capillary: 95 mg/dL (ref 70–99)

## 2019-10-21 LAB — SARS CORONAVIRUS 2 (TAT 6-24 HRS): SARS Coronavirus 2: NEGATIVE

## 2019-10-21 MED ORDER — SENNOSIDES-DOCUSATE SODIUM 8.6-50 MG PO TABS
2.0000 | ORAL_TABLET | Freq: Two times a day (BID) | ORAL | Status: DC
  Administered 2019-10-21 – 2019-10-23 (×5): 2 via ORAL
  Filled 2019-10-21 (×6): qty 2

## 2019-10-21 MED ORDER — POLYETHYLENE GLYCOL 3350 17 G PO PACK
17.0000 g | PACK | Freq: Every day | ORAL | Status: DC
  Administered 2019-10-21 – 2019-10-23 (×3): 17 g via ORAL
  Filled 2019-10-21 (×4): qty 1

## 2019-10-21 NOTE — Progress Notes (Signed)
PROGRESS NOTE    Lauren Waller  GYF:[id number] DOB: 12-22-1955 DOA: 10/17/2019 PCP: Jackie Plum, MD   Brief Narrative:  Three 63-year-old lady with prior history of hypertension hyperlipidemia, CVA, type 2 diabetes mellitus, depression and low back pain presents with 2-day of right leg pain and a fall.  X-rays on admission shows right femur communicated intertrochanteric fracture.  Orthopedics consulted and she underwent intramedullary nail placement intertrochanteric area on 10/17/2019.  Orthopedics saw the patient recommended weightbearing as tolerated.  PT and OT evaluations ordered at this time.  no new complaints, waiting for csw to arrange for SNF placement.  Assessment & Plan:   Principal Problem:   Intertrochanteric fracture of right femur [facility]) Active Problems:   Diabetes mellitus without complication (HCC)   Hypertension   HLD (hyperlipidemia)   Hypertensive urgency   History of CVA with residual deficit   Hypoalbuminemia   Normocytic anemia   Fall   Right femur communited intertrochanteric fracture S/p intramedullary nail placement by orthopedics on 10/17/2019 Weightbearing as tolerated recommended by orthopedics Pain controlled and weightbearing as tolerated.  Appreciate orthopedics recommendations. PT/OT evaluations ordered and recommending SNF placement. TOC consulted to make arrangements.  Pain controlled this am.      Hypertensive urgency Resolved.     Essential hypertension Well controlled.     Hypokalemia Replaced.     History of CVA in August 21, 2019. S/p residual memory deficits and dysarthria present. Restart aspirin.    Hyperlipidemia Continue with statin.     History of cervical spinal stenosis and degenerative disc disease Pain control.  PT evaluation recommending SNF.    Type 2 diabetes mellitus Well controlled CBG'S   CBG (last 3)  Recent Labs    10/20/19 1637 10/21/19 0616 10/21/19 1154  GLUCAP 149*  85 123*    HGBA1C IS at 8.2 Resume SSI.     Anemia of acute blood loss ? From surgery.  Stool for occult blood is negative.  Anemia panel reviewed iron supplementation to be added. Hemoglobin at baseline around 12. Dropped to 8. Recheck CBC in am.  Transfuse to keep hemoglobin greater than 7.  Continue to monitor.    Constipation:  Started him on senna, colace and miralax.     DVT prophylaxis: Lovenox Code Status: Full code Family Communication: None at bedside Disposition Plan: SNF, when bed available.  Consultants:   Orthopedics  Procedures: Intramedullary nail placement on 10/17/2019 Antimicrobials: None Subjective: Pain controlled.  No chest pain or sob.   Objective: Vitals:   10/20/19 2001 10/21/19 0403 10/21/19 0807 10/21/19 1415  BP: (!) 155/66 (!) 142/72 129/60 (!) 128/58  Pulse: 73 72 78 70  Resp:   17 17  Temp: 98 F (36.7 C) 98.2 F (36.8 C) 98.5 F (36.9 C) 99 F (37.2 C)  TempSrc: Oral Oral Oral Oral  SpO2: 99% 98% 99% 98%  Height:       No intake or output data in the 24 hours ending 10/21/19 1443 There were no vitals filed for this visit.  Examination:  General exam: alert and comfortable.  Respiratory system: clear to auscultation, bilaterally, no wheezing or rhonchi.  Cardiovascular system: S1S2, RRR, no JVD.  Gastrointestinal system: abdomen is soft non tender non distended bowel sounds good.  Central nervous system: alert and following commands.  Extremities: no pedal edema, cyanosis or clubbing.  Skin: no rashes seen.  Psychiatry: mood is appropriate.     Data Reviewed: I have personally reviewed following labs and imaging studies  CBC: Recent Labs  Lab 10/17/19 0339 10/18/19 0329  WBC 9.4 12.0*  NEUTROABS 6.9  --   HGB 8.6* 8.1*  HCT 27.4* 25.8*  MCV 88.1 87.2  PLT 325 355   Basic Metabolic Panel: Recent Labs  Lab 10/17/19 0339 10/18/19 0329  NA 140 136  K 3.4* 4.5  CL 101 102  CO2 29 24  GLUCOSE 138* 313*   BUN 23 24*  CREATININE 0.95 0.73  CALCIUM 9.2 8.6*   GFR: CrCl cannot be calculated (Unknown ideal weight.). Liver Function Tests: Recent Labs  Lab 10/17/19 0339  AST 27  ALT 23  ALKPHOS 74  BILITOT 0.9  PROT 6.4*  ALBUMIN 2.8*   No results for input(s): LIPASE, AMYLASE in the last 168 hours. No results for input(s): AMMONIA in the last 168 hours. Coagulation Profile: Recent Labs  Lab 10/17/19 0339  INR 1.0   Cardiac Enzymes: No results for input(s): CKTOTAL, CKMB, CKMBINDEX, TROPONINI in the last 168 hours. BNP (last 3 results) No results for input(s): PROBNP in the last 8760 hours. HbA1C: No results for input(s): HGBA1C in the last 72 hours. CBG: Recent Labs  Lab 10/20/19 0642 10/20/19 1132 10/20/19 1637 10/21/19 0616 10/21/19 1154  GLUCAP 119* 142* 149* 85 123*   Lipid Profile: No results for input(s): CHOL, HDL, LDLCALC, TRIG, CHOLHDL, LDLDIRECT in the last 72 hours. Thyroid Function Tests: No results for input(s): TSH, T4TOTAL, FREET4, T3FREE, THYROIDAB in the last 72 hours. Anemia Panel: No results for input(s): VITAMINB12, FOLATE, FERRITIN, TIBC, IRON, RETICCTPCT in the last 72 hours. Sepsis Labs: No results for input(s): PROCALCITON, LATICACIDVEN in the last 168 hours.  Recent Results (from the past 240 hour(s))  SARS CORONAVIRUS 2 (TAT 6-24 HRS) Nasopharyngeal Nasopharyngeal Swab     Status: None   Collection Time: 10/17/19  3:24 AM   Specimen: Nasopharyngeal Swab  Result Value Ref Range Status   SARS Coronavirus 2 NEGATIVE NEGATIVE Final    Comment: (NOTE) SARS-CoV-2 target nucleic acids are NOT DETECTED. The SARS-CoV-2 RNA is generally detectable in upper and lower respiratory specimens during the acute phase of infection. Negative results do not preclude SARS-CoV-2 infection, do not rule out co-infections with other pathogens, and should not be used as the sole basis for treatment or other patient management decisions. Negative results must  be combined with clinical observations, patient history, and epidemiological information. The expected result is Negative. Fact Sheet for Patients: SugarRoll.be Fact Sheet for Healthcare Providers: https://www.woods-mathews.com/ This test is not yet approved or cleared by the [place] FDA and  has been authorized for detection and/or diagnosis of SARS-CoV-2 by FDA under an Emergency Use Authorization (EUA). This EUA will remain  in effect (meaning this test can be used) for the duration of the COVID-19 declaration under Section 56 4(b)(1) of the Act, 21 U.S.C. section 360bbb-3(b)(1), unless the authorization is terminated or revoked sooner. Performed at [facility], [place] [address] [place], [place] [address]   Surgical pcr screen     Status: None   Collection Time: 10/17/19  1:24 PM   Specimen: Nasal Mucosa; Nasal Swab  Result Value Ref Range Status   MRSA, PCR NEGATIVE NEGATIVE Final   Staphylococcus aureus NEGATIVE NEGATIVE Final    Comment: (NOTE) The Xpert SA Assay (FDA approved for NASAL specimens in patients 67 years of age and older), is one component of a comprehensive surveillance program. It is not intended to diagnose infection nor to guide or monitor treatment. Performed at [facility]  [facility], [address] [place], [place] [address]          Radiology Studies: No results found.      Scheduled Meds: . amLODipine  10 mg Oral Daily  . atenolol  100 mg Oral Daily  . atorvastatin  40 mg Oral Daily  . cloNIDine  0.2 mg Transdermal Weekly  . enoxaparin (LOVENOX) injection  40 mg Subcutaneous Q24H  . feeding supplement (ENSURE ENLIVE)  237 mL Oral BID BM  . ferrous sulfate  325 mg Oral TID WC  . hydrALAZINE  100 mg Oral Q8H  . insulin aspart  0-15 Units Subcutaneous TID WC  . insulin aspart  0-5 Units Subcutaneous QHS  . insulin aspart  2 Units Subcutaneous TID WC  . insulin glargine  20 Units  Subcutaneous QHS  . pantoprazole  40 mg Oral Daily  . polyethylene glycol  17 g Oral Daily  . senna-docusate  2 tablet Oral BID   Continuous Infusions: . sodium chloride 75 mL/hr at 10/20/19 1351     LOS: 4 days        Kathlen ModyVijaya Shilpa Bushee, MD [facility] 10/21/2019, 2:43 PM

## 2019-10-21 NOTE — NC FL2 (Signed)
[place] LEVEL OF CARE SCREENING TOOL     IDENTIFICATION  Patient Name: Lauren Waller Birthdate: 1956/08/02 Sex: female Admission Date (Current Location): 10/17/2019  [facility] and [place] Number:  Herbalist and Address:  [facility], [place] [address], [place], [place] [address]      Provider Number: [id number]  Attending Physician Name and Address:  Hosie Poisson, MD  Relative Name and Phone Number:  Elliot Dally, Niece, [phone]    Current Level of Care: Hospital Recommended Level of Care: [place] Prior Approval Number:    Date Approved/Denied:   PASRR Number: [id number]  Discharge Plan: SNF    Current Diagnoses: Patient Active Problem List   Diagnosis Date Noted  . Intertrochanteric fracture of right femur ([place]) 10/17/2019  . History of CVA with residual deficit 10/17/2019  . Hypoalbuminemia 10/17/2019  . Normocytic anemia 10/17/2019  . Fall 10/17/2019  . 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 HLD (hyperlipidemia) 04/17/2018  . Hypertensive urgency 04/17/2018  . Facial cellulitis 04/17/2018  . Sepsis ([place]) 04/17/2018  . Diabetes mellitus without complication ([place])   . Hypertension   . Varicose veins of bilateral lower extremities with other complications 60/07/9322    Orientation RESPIRATION BLADDER Height & Weight     Self, Situation, Place  Normal Continent, External catheter Weight:   Height:  5\' 4"  (162.6 cm)  BEHAVIORAL SYMPTOMS/MOOD NEUROLOGICAL BOWEL NUTRITION STATUS      Continent Diet(Regular diet, thin liquids)  AMBULATORY STATUS COMMUNICATION OF NEEDS Skin   Extensive Assist Verbally Surgical wounds, Other (Comment)(closed incision on right leg (dressing in place) and dermatitis on buttocks)                       Personal Care Assistance Level of Assistance  Bathing, Feeding, Dressing Bathing Assistance: Maximum  assistance Feeding assistance: Limited assistance(Needs assistance with setting up and feeding self) Dressing Assistance: Maximum assistance     Functional Limitations Info  Sight, Hearing, Speech Sight Info: Adequate Hearing Info: Adequate Speech Info: Adequate    SPECIAL CARE FACTORS FREQUENCY  PT (By licensed PT), OT (By licensed OT)     PT Frequency: 5x/wk OT Frequency: 5x/wk            Contractures Contractures Info: Not present    Additional Factors Info  Code Status, Allergies, Insulin Sliding Scale Code Status Info: Full Code Allergies Info: Januvia (Sitagliptin), Metformin And Related   Insulin Sliding Scale Info: insulin aspart novolog 0-15 units 3x daily w/meals; insulin aspart novolog 0-5 units daily at bedtime; insulin aspart novolog 2 units 3x daily w/meals; insulin glargine lantus 20 units daily at bedtime       Current Medications (10/21/2019):  This is the current hospital active medication list Current Facility-Administered Medications  Medication Dose Route Frequency Provider Last Rate Last Admin  . 0.9 %  sodium chloride infusion   Intravenous Continuous Nicholes Stairs, MD 75 mL/hr at 10/20/19 1351 New Bag at 10/20/19 1351  . amLODipine (NORVASC) tablet 10 mg  10 mg Oral Daily Fuller Plan A, MD   10 mg at 10/21/19 0911  . atenolol (TENORMIN) tablet 100 mg  100 mg Oral Daily Tamala Julian, Rondell A, MD   100 mg at 10/21/19 0911  . atorvastatin (LIPITOR) tablet 40 mg  40 mg Oral Daily Fuller Plan A, MD   40 mg at 10/21/19 0912  . cloNIDine (CATAPRES - Dosed in mg/24 hr)  patch 0.2 mg  0.2 mg Transdermal Weekly Katrinka Blazing, Rondell A, MD   0.2 mg at 10/19/19 0936  . enoxaparin (LOVENOX) injection 40 mg  40 mg Subcutaneous Q24H Yolonda Kida, MD   40 mg at 10/20/19 2157  . feeding supplement (ENSURE ENLIVE) (ENSURE ENLIVE) liquid 237 mL  237 mL Oral BID BM Yolonda Kida, MD   237 mL at 10/21/19 0917  . fentaNYL (SUBLIMAZE) injection 25 mcg  25  mcg Intravenous Q2H PRN Yolonda Kida, MD   25 mcg at 10/18/19 0348  . ferrous sulfate tablet 325 mg  325 mg Oral TID WC Venita Lick, MD   325 mg at 10/21/19 1217  . hydrALAZINE (APRESOLINE) tablet 100 mg  100 mg Oral Q8H Smith, Rondell A, MD   100 mg at 10/21/19 1512  . HYDROcodone-acetaminophen (NORCO/VICODIN) 5-325 MG per tablet 1 tablet  1 tablet Oral Q6H PRN Yolonda Kida, MD   1 tablet at 10/21/19 1512  . insulin aspart (novoLOG) injection 0-15 Units  0-15 Units Subcutaneous TID WC Kathlen Mody, MD   2 Units at 10/21/19 1219  . insulin aspart (novoLOG) injection 0-5 Units  0-5 Units Subcutaneous QHS Kathlen Mody, MD      . insulin aspart (novoLOG) injection 2 Units  2 Units Subcutaneous TID WC Kathlen Mody, MD   2 Units at 10/20/19 1717  . insulin glargine (LANTUS) injection 20 Units  20 Units Subcutaneous QHS Kathlen Mody, MD   20 Units at 10/20/19 2157  . pantoprazole (PROTONIX) EC tablet 40 mg  40 mg Oral Daily Madelyn Flavors A, MD   40 mg at 10/21/19 0912  . polyethylene glycol (MIRALAX / GLYCOLAX) packet 17 g  17 g Oral Daily Kathlen Mody, MD   17 g at 10/21/19 1217  . senna-docusate (Senokot-S) tablet 2 tablet  2 tablet Oral BID Kathlen Mody, MD   2 tablet at 10/21/19 1217     Discharge Medications: Please see discharge summary for a list of discharge medications.  Relevant Imaging Results:  Relevant Lab Results:   Additional Information SSN: [id number]; COVID negative on 10/17/2019  Hanae Waiters B Tessah Patchen, LCSWA

## 2019-10-22 LAB — GLUCOSE, CAPILLARY
Glucose-Capillary: 110 mg/dL — ABNORMAL HIGH (ref 70–99)
Glucose-Capillary: 67 mg/dL — ABNORMAL LOW (ref 70–99)
Glucose-Capillary: 82 mg/dL (ref 70–99)
Glucose-Capillary: 104 mg/dL — ABNORMAL HIGH (ref 70–99)
Glucose-Capillary: 141 mg/dL — ABNORMAL HIGH (ref 70–99)
Glucose-Capillary: 138 mg/dL — ABNORMAL HIGH (ref 70–99)

## 2019-10-22 MED ORDER — ADULT MULTIVITAMIN W/MINERALS CH
1.0000 | ORAL_TABLET | Freq: Every day | ORAL | Status: DC
  Administered 2019-10-22: 1 via ORAL
  Filled 2019-10-22 (×3): qty 1

## 2019-10-22 MED ORDER — ENSURE ENLIVE PO LIQD
237.0000 mL | Freq: Three times a day (TID) | ORAL | Status: DC
  Administered 2019-10-22 – 2019-10-23 (×4): 237 mL via ORAL

## 2019-10-22 NOTE — Plan of Care (Signed)

## 2019-10-22 NOTE — Progress Notes (Signed)
Nutrition Follow-up  DOCUMENTATION CODES:   Not applicable  INTERVENTION:   -MVI with minerals daily -Increase Ensure Enlive po TID, each supplement provides 350 kcal and 20 grams of protein   NUTRITION DIAGNOSIS:   Increased nutrient needs related to post-op healing as evidenced by estimated needs.  Ongoing  GOAL:   Patient will meet greater than or equal to 90% of their needs  Progressing   MONITOR:   PO intake, Supplement acceptance, Labs, Weight trends  REASON FOR ASSESSMENT:   Consult Assessment of nutrition requirement/status  ASSESSMENT:   63 yo female admitted post fall with right femur fracture. PMH includes DM, HTN, CVA  12/24- s/p PROCEDURE: Treatment of intertrochanteric, pertrochanteric, subtrochanteric fracture with intramedullary implant. CPT [phone]   Reviewed I/O's: -700 ml x 24 hours and -854 ml since admission  UOP: 700 ml x 24 hours  Pt sleeping soundly in recliner chair at time of visit. Noted meal tray set up, however, untouched. Pt did not respond to voice or touch.   Per RN, pt meal intake has been variable and experienced hypoglycemic episode due to poor oral intake. Meal completion documented 25-100%. Pt is taking Ensure supplements.   No documented BM since admission. Pt with miralax and senna ordered (10/19/19 and 10/21/19, respectively).   Pt with poor oral intake and would benefit from nutrient dense supplement. One Ensure Enlive supplement provides 350 kcals, 20 grams protein, and 44-45 grams of carbohydrate vs one Glucerna shake supplement, which provides 220 kcals, 10 grams of protein, and 26 grams of carbohydrate. Given pt's hx of DM, RD will continue to monitor PO intake, CBGS, and adjust supplement regimen as appropriate.   Plan to d/c to SNF once medically stable.  Labs reviewed: CBGS: 67-95 (inpatient orders for glycemic control are 0-15 units insulin aspart TID with meals, 0-5 units insulin aspart q HS, 2 units indulin apsart  TID with meal,s and 20 unit insulin glargine daily.).   NUTRITION - FOCUSED PHYSICAL EXAM:    Most Recent Value  Orbital Region  No depletion  Upper Arm Region  No depletion  Thoracic and Lumbar Region  No depletion  Buccal Region  No depletion  Temple Region  No depletion  Clavicle Bone Region  No depletion  Clavicle and Acromion Bone Region  No depletion  Scapular Bone Region  No depletion  Dorsal Hand  No depletion  Patellar Region  No depletion  Anterior Thigh Region  No depletion  Posterior Calf Region  No depletion  Edema (RD Assessment)  None  Hair  Reviewed  Eyes  Reviewed  Mouth  Reviewed  Skin  Reviewed  Nails  Reviewed       Diet Order:   Diet Order            Diet regular Room service appropriate? Yes; Fluid consistency: Thin  Diet effective now              EDUCATION NEEDS:   Not appropriate for education at this time  Skin:  Skin Assessment: Skin Integrity Issues: Skin Integrity Issues:: Incisions Incisions: closed rt leg  Last BM:  Unknown  Height:   Ht Readings from Last 1 Encounters:  10/17/19 5\' 4"  (1.626 m)    Weight:   Wt Readings from Last 1 Encounters:  08/08/19 77 kg    Ideal Body Weight:  54.5 kg  BMI:  Body mass index is 29.14 kg/m.  Estimated Nutritional Needs:   Kcal:  1700-1900 kcals  Protein:  85-95 g  Fluid:  >/= 1.7    Robson Trickey A. Jimmye Norman, RD, LDN, [place] Registered Dietitian II Certified Diabetes Care and Education Specialist Pager: [phone] After hours Pager: [phone]

## 2019-10-22 NOTE — Progress Notes (Signed)
PROGRESS NOTE    Lauren Waller  OIZ:[id number] DOB: May 14, 1956 DOA: 10/17/2019 PCP: Benito Mccreedy, MD   Brief Narrative: Patient is a 63 year old African-American female with past medical history significant for hypertension, hyperlipidemia, CVA, type 2 diabetes mellitus, depression and low back pain.  Patient presented to the hospital following a fall with associated 2-day history of right neck pain. X-rays on admission revealed right femur communicated intertrochanteric fracture.  Orthopedics team was consulted and patient underwent intramedullary nail placement intertrochanteric area on 10/17/2019.  Orthopedics saw the patient recommended weightbearing as tolerated.  PT and OT evaluations ordered at this time.  Plan is for skilled nursing facility placement.  10/22/2019: Patient seen.  No new complaints.  Patient is awaiting disposition.  Assessment & Plan:   Principal Problem:   Intertrochanteric fracture of right femur [facility]) Active Problems:   Diabetes mellitus without complication (HCC)   Hypertension   HLD (hyperlipidemia)   Hypertensive urgency   History of CVA with residual deficit   Hypoalbuminemia   Normocytic anemia   Fall   Right femur communited intertrochanteric fracture: -S/p intramedullary nail placement by orthopedics on 10/17/2019 Weightbearing as tolerated recommended by orthopedics Pain controlled and weightbearing as tolerated.  Appreciate orthopedics recommendations. PT/OT evaluations ordered and recommending SNF placement. TOC consulted to make arrangements.  Pain controlled this am. 10/22/2019: Patient is optimized.  Pursue disposition when a bed is available.  Hypertensive urgency: Resolved.   Hypokalemia: Replaced.   History of CVA in August 21, 2019: S/p residual memory deficits and dysarthria present. Restart aspirin.  10/22/2019: No new changes.  Hyperlipidemia: Continue statin.   History of cervical spinal stenosis and  degenerative disc disease: Pain control.  PT evaluation recommending SNF. 10/22/2019: Optimize pain control.  Type 2 diabetes mellitus: Well controlled CBG'S Hemoglobin A1c was 8.2% Resume sliding scale insulin coverage 10/22/2019: Continue to monitor closely.   CBG (last 3)  Recent Labs    10/22/19 0649 10/22/19 0720 10/22/19 1158  GLUCAP 67* 82 104*    Anemia:   Likely acute blood loss anemia from surgery  Stool for occult blood is negative.  Anemia panel reviewed iron supplementation to be added. Hemoglobin at baseline around 12. Dropped to 8. Recheck CBC in am.  Transfuse to keep hemoglobin greater than 7.  Continue to monitor.   Constipation:  Started him on senna, colace and miralax.   DVT prophylaxis: Lovenox Code Status: Full code Family Communication: None at bedside Disposition Plan: SNF, when bed available.  Consultants:   Orthopedics  Procedures: Intramedullary nail placement on 10/17/2019 Antimicrobials: None Subjective: Pain controlled.  No chest pain or sob.   Objective: Vitals:   10/22/19 0441 10/22/19 0850 10/22/19 0918 10/22/19 1411  BP: (!) 142/67 (!) 132/59 136/88 124/62  Pulse: 70 76 64 70  Resp: 14 15 16 16   Temp: 98.5 F (36.9 C) 98 F (36.7 C) 99.3 F (37.4 C) 99.1 F (37.3 C)  TempSrc: Oral Oral Oral Oral  SpO2: 97% 100% 100% 99%  Height:        Intake/Output Summary (Last 24 hours) at 10/22/2019 1656 Last data filed at 10/22/2019 1200 Gross per 24 hour  Intake 120 ml  Output 700 ml  Net -580 ml   There were no vitals filed for this visit.  Examination:  General exam: alert and comfortable.  Respiratory system: clear to auscultation, bilaterally, no wheezing or rhonchi.  Cardiovascular system: S1S2.  Gastrointestinal system: abdomen is soft non tender non distended bowel sounds good.  Central nervous  system: alert and following commands.  Extremities: no pedal edema, cyanosis or clubbing.  Skin: no rashes seen.   Psychiatry: mood is appropriate.   Data Reviewed: I have personally reviewed following labs and imaging studies  CBC: Recent Labs  Lab 10/17/19 0339 10/18/19 0329  WBC 9.4 12.0*  NEUTROABS 6.9  --   HGB 8.6* 8.1*  HCT 27.4* 25.8*  MCV 88.1 87.2  PLT 325 365   Basic Metabolic Panel: Recent Labs  Lab 10/17/19 0339 10/18/19 0329  NA 140 136  K 3.4* 4.5  CL 101 102  CO2 29 24  GLUCOSE 138* 313*  BUN 23 24*  CREATININE 0.95 0.73  CALCIUM 9.2 8.6*   GFR: CrCl cannot be calculated (Unknown ideal weight.). Liver Function Tests: Recent Labs  Lab 10/17/19 0339  AST 27  ALT 23  ALKPHOS 74  BILITOT 0.9  PROT 6.4*  ALBUMIN 2.8*   No results for input(s): LIPASE, AMYLASE in the last 168 hours. No results for input(s): AMMONIA in the last 168 hours. Coagulation Profile: Recent Labs  Lab 10/17/19 0339  INR 1.0   Cardiac Enzymes: No results for input(s): CKTOTAL, CKMB, CKMBINDEX, TROPONINI in the last 168 hours. BNP (last 3 results) No results for input(s): PROBNP in the last 8760 hours. HbA1C: No results for input(s): HGBA1C in the last 72 hours. CBG: Recent Labs  Lab 10/21/19 1654 10/21/19 2205 10/22/19 0649 10/22/19 0720 10/22/19 1158  GLUCAP 165* 95 67* 82 104*   Lipid Profile: No results for input(s): CHOL, HDL, LDLCALC, TRIG, CHOLHDL, LDLDIRECT in the last 72 hours. Thyroid Function Tests: No results for input(s): TSH, T4TOTAL, FREET4, T3FREE, THYROIDAB in the last 72 hours. Anemia Panel: No results for input(s): VITAMINB12, FOLATE, FERRITIN, TIBC, IRON, RETICCTPCT in the last 72 hours. Sepsis Labs: No results for input(s): PROCALCITON, LATICACIDVEN in the last 168 hours.  Recent Results (from the past 240 hour(s))  SARS CORONAVIRUS 2 (TAT 6-24 HRS) Nasopharyngeal Nasopharyngeal Swab     Status: None   Collection Time: 10/17/19  3:24 AM   Specimen: Nasopharyngeal Swab  Result Value Ref Range Status   SARS Coronavirus 2 NEGATIVE NEGATIVE Final     Comment: (NOTE) SARS-CoV-2 target nucleic acids are NOT DETECTED. The SARS-CoV-2 RNA is generally detectable in upper and lower respiratory specimens during the acute phase of infection. Negative results do not preclude SARS-CoV-2 infection, do not rule out co-infections with other pathogens, and should not be used as the sole basis for treatment or other patient management decisions. Negative results must be combined with clinical observations, patient history, and epidemiological information. The expected result is Negative. Fact Sheet for Patients: HairSlick.nohttps://www.fda.gov/media/138098/download Fact Sheet for Healthcare Providers: quierodirigir.comhttps://www.fda.gov/media/138095/download This test is not yet approved or cleared by the [place] FDA and  has been authorized for detection and/or diagnosis of SARS-CoV-2 by FDA under an Emergency Use Authorization (EUA). This EUA will remain  in effect (meaning this test can be used) for the duration of the COVID-19 declaration under Section 56 4(b)(1) of the Act, 21 U.S.C. section 360bbb-3(b)(1), unless the authorization is terminated or revoked sooner. Performed at [facility], [address] [place], [place] [address]   Surgical pcr screen     Status: None   Collection Time: 10/17/19  1:24 PM   Specimen: Nasal Mucosa; Nasal Swab  Result Value Ref Range Status   MRSA, PCR NEGATIVE NEGATIVE Final   Staphylococcus aureus NEGATIVE NEGATIVE Final    Comment: (NOTE) The Xpert SA Assay (  FDA approved for NASAL specimens in patients 43 years of age and older), is one component of a comprehensive surveillance program. It is not intended to diagnose infection nor to guide or monitor treatment. Performed at [facility], [address], [place], [place] [address]   SARS CORONAVIRUS 2 (TAT 6-24 HRS) Nasopharyngeal Nasopharyngeal Swab     Status: None   Collection Time: 10/21/19  4:59 PM   Specimen: Nasopharyngeal Swab  Result Value Ref  Range Status   SARS Coronavirus 2 NEGATIVE NEGATIVE Final    Comment: (NOTE) SARS-CoV-2 target nucleic acids are NOT DETECTED. The SARS-CoV-2 RNA is generally detectable in upper and lower respiratory specimens during the acute phase of infection. Negative results do not preclude SARS-CoV-2 infection, do not rule out co-infections with other pathogens, and should not be used as the sole basis for treatment or other patient management decisions. Negative results must be combined with clinical observations, patient history, and epidemiological information. The expected result is Negative. Fact Sheet for Patients: HairSlick.no Fact Sheet for Healthcare Providers: quierodirigir.com This test is not yet approved or cleared by the [place] FDA and  has been authorized for detection and/or diagnosis of SARS-CoV-2 by FDA under an Emergency Use Authorization (EUA). This EUA will remain  in effect (meaning this test can be used) for the duration of the COVID-19 declaration under Section 56 4(b)(1) of the Act, 21 U.S.C. section 360bbb-3(b)(1), unless the authorization is terminated or revoked sooner. Performed at [facility], [address] [place], [place] [address]          Radiology Studies: No results found.      Scheduled Meds: . amLODipine  10 mg Oral Daily  . atenolol  100 mg Oral Daily  . atorvastatin  40 mg Oral Daily  . cloNIDine  0.2 mg Transdermal Weekly  . enoxaparin (LOVENOX) injection  40 mg Subcutaneous Q24H  . feeding supplement (ENSURE ENLIVE)  237 mL Oral TID BM  . ferrous sulfate  325 mg Oral TID WC  . hydrALAZINE  100 mg Oral Q8H  . insulin aspart  0-15 Units Subcutaneous TID WC  . insulin aspart  0-5 Units Subcutaneous QHS  . insulin aspart  2 Units Subcutaneous TID WC  . insulin glargine  20 Units Subcutaneous QHS  . multivitamin with minerals  1 tablet Oral Daily  . pantoprazole  40 mg  Oral Daily  . polyethylene glycol  17 g Oral Daily  . senna-docusate  2 tablet Oral BID   Continuous Infusions: . sodium chloride 75 mL/hr at 10/20/19 1351     LOS: 5 days    Barnetta Chapel, MD [facility] 10/22/2019, 4:56 PM

## 2019-10-22 NOTE — Progress Notes (Signed)
Hypoglycemic Event  CBG: 67  Treatment: 4oz juice  Symptoms: None  Follow-up CBG: Time: 0720 CBG Result: 82  Possible Reasons for Event: Pt not eating  Comments/MD notified: Dr. Marthenia Rolling notified.    Debbora Dus

## 2019-10-23 DIAGNOSIS — E160 Drug-induced hypoglycemia without coma: Secondary | ICD-10-CM

## 2019-10-23 DIAGNOSIS — T383X5A Adverse effect of insulin and oral hypoglycemic [antidiabetic] drugs, initial encounter: Secondary | ICD-10-CM

## 2019-10-23 LAB — GLUCOSE, CAPILLARY
Glucose-Capillary: 96 mg/dL (ref 70–99)
Glucose-Capillary: 72 mg/dL (ref 70–99)
Glucose-Capillary: 91 mg/dL (ref 70–99)
Glucose-Capillary: 100 mg/dL — ABNORMAL HIGH (ref 70–99)
Glucose-Capillary: 53 mg/dL — ABNORMAL LOW (ref 70–99)
Glucose-Capillary: 179 mg/dL — ABNORMAL HIGH (ref 70–99)
Glucose-Capillary: 186 mg/dL — ABNORMAL HIGH (ref 70–99)
Glucose-Capillary: 195 mg/dL — ABNORMAL HIGH (ref 70–99)
Glucose-Capillary: 178 mg/dL — ABNORMAL HIGH (ref 70–99)

## 2019-10-23 MED ORDER — DEXTROSE-NACL 5-0.9 % IV SOLN: INTRAVENOUS | Status: DC

## 2019-10-23 MED ORDER — DEXTROSE 50 % IV SOLN
1.0000 | Freq: Once | INTRAVENOUS | Status: AC
  Administered 2019-10-23: 50 mL via INTRAVENOUS
  Filled 2019-10-23: qty 50

## 2019-10-23 NOTE — Progress Notes (Addendum)
Blood sugar of 53 reported to Dr. Marthenia Rolling.  MD orders received for D50, change in IV fluids, and Q1hr blood sugar checks x4.

## 2019-10-23 NOTE — Plan of Care (Signed)
  Problem: Education: Goal: Knowledge of General Education information will improve Description: Including pain rating scale, medication(s)/side effects and non-pharmacologic comfort measures Outcome: Not Progressing   Problem: Health Behavior/Discharge Planning: Goal: Ability to manage health-related needs will improve Outcome: Not Progressing   Problem: Clinical Measurements: Goal: Will remain free from infection Outcome: Not Progressing Goal: Respiratory complications will improve Outcome: Not Progressing

## 2019-10-23 NOTE — Progress Notes (Signed)
PT Cancellation Note  Patient Details Name: Febe Champa MRN: [id number] DOB: [birth year]   Cancelled Treatment:    Reason Eval/Treat Not Completed: Patient declined, no reason specified   Stated, "Later"; then pulled her covers over her when we gave more encouragement for getting up;  Not open to education re: benefits of being OOB, and detriments of prolonged bedrest;   Roney Marion, [place] Pager [phone] Office [phone]    Colletta Maryland 10/23/2019, 10:17 AM

## 2019-10-23 NOTE — Progress Notes (Signed)
PROGRESS NOTE    Lauren Waller  ZOX:[id number] DOB: [birth year] DOA: 10/17/2019 PCP: Jackie Plumsei-Bonsu, George, MD   Brief Narrative: Patient is a 63 year old African-American female with past medical history significant for hypertension, hyperlipidemia, CVA, type 2 diabetes mellitus, depression and low back pain.  Patient presented to the hospital following a fall with associated 2-day history of right neck pain. X-rays on admission revealed right femur communicated intertrochanteric fracture.  Orthopedics team was consulted and patient underwent intramedullary nail placement intertrochanteric area on 10/17/2019.  Orthopedics saw the patient recommended weightbearing as tolerated.  PT and OT evaluations ordered at this time.  Plan is for skilled nursing facility placement.  10/22/2019: Patient seen.  No new complaints.  Patient is awaiting disposition.  10/23/2019: Patient seen.  Blood sugar 53 reported earlier today.  Will discontinue subcutaneous Lantus and subcutaneous Novolin 2 units with meals.  Meanwhile, will administer D50, start patient on D5 for 10 check blood sugar every hour for the next 4 hours.  Further management will depend on hospital course.  Discharge COVID-19 test sent as a Child psychotherapistsocial worker may have found a place for the patient.  Assessment & Plan:   Principal Problem:   Intertrochanteric fracture of right femur [facility](HCC) Active Problems:   Diabetes mellitus without complication (HCC)   Hypertension   HLD (hyperlipidemia)   Hypertensive urgency   History of CVA with residual deficit   Hypoalbuminemia   Normocytic anemia   Fall   Right femur communited intertrochanteric fracture: -S/p intramedullary nail placement by orthopedics on 10/17/2019 Weightbearing as tolerated recommended by orthopedics Pain controlled and weightbearing as tolerated.  Appreciate orthopedics recommendations. PT/OT evaluations ordered and recommending SNF placement. TOC consulted to make  arrangements.  Pain controlled this am. 10/22/2019: Patient is optimized.  Pursue disposition when a bed is available. 10/23/2019: Hopefully, patient be discharged tomorrow.  Discharge COVID-19 testing has been ordered.  Hypertensive urgency: Resolved.   Hypokalemia: Continue to monitor and replace.  History of CVA in August 21, 2019: S/p residual memory deficits and dysarthria present. Restart aspirin.  10/22/2019: No new changes.  Hyperlipidemia: Continue statin.   History of cervical spinal stenosis and degenerative disc disease: Pain control.  PT evaluation recommending SNF. 10/22/2019: Optimize pain control.  Type 2 diabetes mellitus: Well controlled CBG'S Hemoglobin A1c was 8.2% Resume sliding scale insulin coverage 10/22/2019: Continue to monitor closely. 10/23/2019: Hypoglycemia noted today.  Please see above.   CBG (last 3)  Recent Labs    10/23/19 1708 10/23/19 1756 10/23/19 1903  GLUCAP 53* 179* 186*    Anemia:   Likely acute blood loss anemia from surgery  Stool for occult blood is negative.  Anemia panel reviewed iron supplementation to be added. Hemoglobin at baseline around 12. Dropped to 8. Recheck CBC in am.  Transfuse to keep hemoglobin greater than 7.  Continue to monitor.   Constipation:  Started him on senna, colace and miralax.   DVT prophylaxis: Lovenox Code Status: Full code Family Communication: None at bedside Disposition Plan: SNF, when bed available.  Consultants:   Orthopedics  Procedures: Intramedullary nail placement on 10/17/2019 Antimicrobials: None Subjective: Pain controlled.  No chest pain or sob.   Objective: Vitals:   10/22/19 2018 10/23/19 0418 10/23/19 0839 10/23/19 1401  BP: 134/67 (!) 156/67 (!) 148/64 131/65  Pulse: 72 70 75 68  Resp: 18 14 14 14   Temp: 98.5 F (36.9 C) 99.4 F (37.4 C) 98.8 F (37.1 C) 99.3 F (37.4 C)  TempSrc: Oral Oral Oral Oral  SpO2: 97% 99% 99% 98%  Height:         Intake/Output Summary (Last 24 hours) at 10/23/2019 2009 Last data filed at 10/23/2019 1839 Gross per 24 hour  Intake 6159.78 ml  Output 900 ml  Net 5259.78 ml   There were no vitals filed for this visit.  Examination:  General exam: alert and comfortable.  Respiratory system: clear to auscultation, bilaterally, no wheezing or rhonchi.  Cardiovascular system: S1S2.  Gastrointestinal system: abdomen is soft non tender non distended bowel sounds good.  Central nervous system: alert and following commands.  Extremities: no pedal edema, cyanosis or clubbing.  Skin: no rashes seen.  Psychiatry: mood is appropriate.   Data Reviewed: I have personally reviewed following labs and imaging studies  CBC: Recent Labs  Lab 10/17/19 0339 10/18/19 0329  WBC 9.4 12.0*  NEUTROABS 6.9  --   HGB 8.6* 8.1*  HCT 27.4* 25.8*  MCV 88.1 87.2  PLT 325 259   Basic Metabolic Panel: Recent Labs  Lab 10/17/19 0339 10/18/19 0329  NA 140 136  K 3.4* 4.5  CL 101 102  CO2 29 24  GLUCOSE 138* 313*  BUN 23 24*  CREATININE 0.95 0.73  CALCIUM 9.2 8.6*   GFR: CrCl cannot be calculated (Unknown ideal weight.). Liver Function Tests: Recent Labs  Lab 10/17/19 0339  AST 27  ALT 23  ALKPHOS 74  BILITOT 0.9  PROT 6.4*  ALBUMIN 2.8*   No results for input(s): LIPASE, AMYLASE in the last 168 hours. No results for input(s): AMMONIA in the last 168 hours. Coagulation Profile: Recent Labs  Lab 10/17/19 0339  INR 1.0   Cardiac Enzymes: No results for input(s): CKTOTAL, CKMB, CKMBINDEX, TROPONINI in the last 168 hours. BNP (last 3 results) No results for input(s): PROBNP in the last 8760 hours. HbA1C: No results for input(s): HGBA1C in the last 72 hours. CBG: Recent Labs  Lab 10/23/19 0954 10/23/19 1213 10/23/19 1708 10/23/19 1756 10/23/19 1903  GLUCAP 91 100* 53* 179* 186*   Lipid Profile: No results for input(s): CHOL, HDL, LDLCALC, TRIG, CHOLHDL, LDLDIRECT in the last 72  hours. Thyroid Function Tests: No results for input(s): TSH, T4TOTAL, FREET4, T3FREE, THYROIDAB in the last 72 hours. Anemia Panel: No results for input(s): VITAMINB12, FOLATE, FERRITIN, TIBC, IRON, RETICCTPCT in the last 72 hours. Sepsis Labs: No results for input(s): PROCALCITON, LATICACIDVEN in the last 168 hours.  Recent Results (from the past 240 hour(s))  SARS CORONAVIRUS 2 (TAT 6-24 HRS) Nasopharyngeal Nasopharyngeal Swab     Status: None   Collection Time: 10/17/19  3:24 AM   Specimen: Nasopharyngeal Swab  Result Value Ref Range Status   SARS Coronavirus 2 NEGATIVE NEGATIVE Final    Comment: (NOTE) SARS-CoV-2 target nucleic acids are NOT DETECTED. The SARS-CoV-2 RNA is generally detectable in upper and lower respiratory specimens during the acute phase of infection. Negative results do not preclude SARS-CoV-2 infection, do not rule out co-infections with other pathogens, and should not be used as the sole basis for treatment or other patient management decisions. Negative results must be combined with clinical observations, patient history, and epidemiological information. The expected result is Negative. Fact Sheet for Patients: SugarRoll.be Fact Sheet for Healthcare Providers: https://www.woods-mathews.com/ This test is not yet approved or cleared by the [place] FDA and  has been authorized for detection and/or diagnosis of SARS-CoV-2 by FDA under an Emergency Use Authorization (EUA). This EUA will remain  in effect (meaning this test can be used) for  the duration of the COVID-19 declaration under Section 56 4(b)(1) of the Act, 21 U.S.C. section 360bbb-3(b)(1), unless the authorization is terminated or revoked sooner. Performed at [facility], [address], [place], [place] [address]   Surgical pcr screen     Status: None   Collection Time: 10/17/19  1:24 PM   Specimen: Nasal Mucosa; Nasal Swab  Result Value Ref  Range Status   MRSA, PCR NEGATIVE NEGATIVE Final   Staphylococcus aureus NEGATIVE NEGATIVE Final    Comment: (NOTE) The Xpert SA Assay (FDA approved for NASAL specimens in patients 25 years of age and older), is one component of a comprehensive surveillance program. It is not intended to diagnose infection nor to guide or monitor treatment. Performed at [facility], [address], [place], [place] [address]   SARS CORONAVIRUS 2 (TAT 6-24 HRS) Nasopharyngeal Nasopharyngeal Swab     Status: None   Collection Time: 10/21/19  4:59 PM   Specimen: Nasopharyngeal Swab  Result Value Ref Range Status   SARS Coronavirus 2 NEGATIVE NEGATIVE Final    Comment: (NOTE) SARS-CoV-2 target nucleic acids are NOT DETECTED. The SARS-CoV-2 RNA is generally detectable in upper and lower respiratory specimens during the acute phase of infection. Negative results do not preclude SARS-CoV-2 infection, do not rule out co-infections with other pathogens, and should not be used as the sole basis for treatment or other patient management decisions. Negative results must be combined with clinical observations, patient history, and epidemiological information. The expected result is Negative. Fact Sheet for Patients: HairSlick.no Fact Sheet for Healthcare Providers: quierodirigir.com This test is not yet approved or cleared by the [place] FDA and  has been authorized for detection and/or diagnosis of SARS-CoV-2 by FDA under an Emergency Use Authorization (EUA). This EUA will remain  in effect (meaning this test can be used) for the duration of the COVID-19 declaration under Section 56 4(b)(1) of the Act, 21 U.S.C. section 360bbb-3(b)(1), unless the authorization is terminated or revoked sooner. Performed at [facility], [address], [place], [place] [address]          Radiology Studies: No results found.      Scheduled  Meds: . amLODipine  10 mg Oral Daily  . atenolol  100 mg Oral Daily  . atorvastatin  40 mg Oral Daily  . cloNIDine  0.2 mg Transdermal Weekly  . enoxaparin (LOVENOX) injection  40 mg Subcutaneous Q24H  . feeding supplement (ENSURE ENLIVE)  237 mL Oral TID BM  . ferrous sulfate  325 mg Oral TID WC  . hydrALAZINE  100 mg Oral Q8H  . insulin aspart  0-15 Units Subcutaneous TID WC  . insulin aspart  0-5 Units Subcutaneous QHS  . insulin aspart  2 Units Subcutaneous TID WC  . insulin glargine  20 Units Subcutaneous QHS  . multivitamin with minerals  1 tablet Oral Daily  . pantoprazole  40 mg Oral Daily  . polyethylene glycol  17 g Oral Daily  . senna-docusate  2 tablet Oral BID   Continuous Infusions: . sodium chloride Stopped (10/23/19 1742)  . dextrose 5 % and 0.9% NaCl 50 mL/hr at 10/23/19 1749     LOS: 6 days    Barnetta Chapel, MD [facility] 10/23/2019, 8:09 PM

## 2019-10-23 NOTE — Progress Notes (Signed)
Blood sugar of 72 reported to Dr. Marthenia Rolling.  MD states to hold 2 unit dose of Novolog for 0800 scheduled dose.

## 2019-10-24 DIAGNOSIS — S72141D Displaced intertrochanteric fracture of right femur, subsequent encounter for closed fracture with routine healing: Secondary | ICD-10-CM | POA: Diagnosis not present

## 2019-10-24 DIAGNOSIS — S72091A Other fracture of head and neck of right femur, initial encounter for closed fracture: Secondary | ICD-10-CM | POA: Diagnosis not present

## 2019-10-24 DIAGNOSIS — R5381 Other malaise: Secondary | ICD-10-CM | POA: Diagnosis not present

## 2019-10-24 DIAGNOSIS — M5030 Other cervical disc degeneration, unspecified cervical region: Secondary | ICD-10-CM | POA: Diagnosis not present

## 2019-10-24 DIAGNOSIS — S79929A Unspecified injury of unspecified thigh, initial encounter: Secondary | ICD-10-CM | POA: Diagnosis not present

## 2019-10-24 DIAGNOSIS — E11649 Type 2 diabetes mellitus with hypoglycemia without coma: Secondary | ICD-10-CM | POA: Diagnosis not present

## 2019-10-24 DIAGNOSIS — W19XXXA Unspecified fall, initial encounter: Secondary | ICD-10-CM | POA: Diagnosis not present

## 2019-10-24 DIAGNOSIS — M2550 Pain in unspecified joint: Secondary | ICD-10-CM | POA: Diagnosis not present

## 2019-10-24 DIAGNOSIS — I10 Essential (primary) hypertension: Secondary | ICD-10-CM | POA: Diagnosis not present

## 2019-10-24 DIAGNOSIS — R2689 Other abnormalities of gait and mobility: Secondary | ICD-10-CM | POA: Diagnosis not present

## 2019-10-24 DIAGNOSIS — R262 Difficulty in walking, not elsewhere classified: Secondary | ICD-10-CM | POA: Diagnosis not present

## 2019-10-24 DIAGNOSIS — R410 Disorientation, unspecified: Secondary | ICD-10-CM | POA: Diagnosis not present

## 2019-10-24 DIAGNOSIS — I6930 Unspecified sequelae of cerebral infarction: Secondary | ICD-10-CM | POA: Diagnosis not present

## 2019-10-24 DIAGNOSIS — Z7401 Bed confinement status: Secondary | ICD-10-CM | POA: Diagnosis not present

## 2019-10-24 DIAGNOSIS — Z741 Need for assistance with personal care: Secondary | ICD-10-CM | POA: Diagnosis not present

## 2019-10-24 DIAGNOSIS — E785 Hyperlipidemia, unspecified: Secondary | ICD-10-CM | POA: Diagnosis not present

## 2019-10-24 DIAGNOSIS — R2681 Unsteadiness on feet: Secondary | ICD-10-CM | POA: Diagnosis not present

## 2019-10-24 DIAGNOSIS — E10649 Type 1 diabetes mellitus with hypoglycemia without coma: Secondary | ICD-10-CM | POA: Diagnosis not present

## 2019-10-24 DIAGNOSIS — I2510 Atherosclerotic heart disease of native coronary artery without angina pectoris: Secondary | ICD-10-CM | POA: Diagnosis not present

## 2019-10-24 DIAGNOSIS — M6281 Muscle weakness (generalized): Secondary | ICD-10-CM | POA: Diagnosis not present

## 2019-10-24 DIAGNOSIS — D649 Anemia, unspecified: Secondary | ICD-10-CM | POA: Diagnosis not present

## 2019-10-24 DIAGNOSIS — T383X5A Adverse effect of insulin and oral hypoglycemic [antidiabetic] drugs, initial encounter: Secondary | ICD-10-CM | POA: Diagnosis not present

## 2019-10-24 DIAGNOSIS — E119 Type 2 diabetes mellitus without complications: Secondary | ICD-10-CM | POA: Diagnosis not present

## 2019-10-24 DIAGNOSIS — R278 Other lack of coordination: Secondary | ICD-10-CM | POA: Diagnosis not present

## 2019-10-24 DIAGNOSIS — I6330 Cerebral infarction due to thrombosis of unspecified cerebral artery: Secondary | ICD-10-CM | POA: Diagnosis not present

## 2019-10-24 DIAGNOSIS — I69315 Cognitive social or emotional deficit following cerebral infarction: Secondary | ICD-10-CM | POA: Diagnosis not present

## 2019-10-24 LAB — GLUCOSE, CAPILLARY
Glucose-Capillary: 155 mg/dL — ABNORMAL HIGH (ref 70–99)
Glucose-Capillary: 200 mg/dL — ABNORMAL HIGH (ref 70–99)

## 2019-10-24 MED ORDER — FERROUS SULFATE 325 (65 FE) MG PO TABS: 325.0000 mg | ORAL_TABLET | Freq: Two times a day (BID) | ORAL | 0 refills | Status: AC

## 2019-10-24 MED ORDER — ENSURE ENLIVE PO LIQD
237.0000 mL | Freq: Three times a day (TID) | ORAL | Status: DC
  Administered 2019-10-24: 237 mL via ORAL

## 2019-10-24 MED ORDER — POLYETHYLENE GLYCOL 3350 17 G PO PACK: 17.0000 g | PACK | Freq: Every day | ORAL | 0 refills | Status: AC

## 2019-10-24 MED ORDER — MEGESTROL ACETATE 400 MG/10ML PO SUSP: 400.0000 mg | Freq: Two times a day (BID) | ORAL | 0 refills | Status: DC

## 2019-10-24 MED ORDER — SENNOSIDES-DOCUSATE SODIUM 8.6-50 MG PO TABS: 2.0000 | ORAL_TABLET | Freq: Two times a day (BID) | ORAL | 0 refills | Status: AC

## 2019-10-24 MED ORDER — ADULT MULTIVITAMIN W/MINERALS CH: 1.0000 | ORAL_TABLET | Freq: Every day | ORAL | 0 refills | Status: AC

## 2019-10-24 MED ORDER — HYDROCODONE-ACETAMINOPHEN 5-325 MG PO TABS: 1.0000 | ORAL_TABLET | Freq: Four times a day (QID) | ORAL | 0 refills | Status: DC | PRN

## 2019-10-24 MED ORDER — INSULIN ASPART 100 UNIT/ML ~~LOC~~ SOLN: 0.0000 [IU] | Freq: Three times a day (TID) | SUBCUTANEOUS | 11 refills | Status: DC

## 2019-10-24 MED ORDER — BOOST / RESOURCE BREEZE PO LIQD CUSTOM: 1.0000 | Freq: Three times a day (TID) | ORAL | Status: DC

## 2019-10-24 MED ORDER — ENOXAPARIN SODIUM 40 MG/0.4ML ~~LOC~~ SOLN: 40.0000 mg | SUBCUTANEOUS | 0 refills | Status: DC

## 2019-10-24 NOTE — Progress Notes (Signed)
Nutrition Follow-up  DOCUMENTATION CODES:   Not applicable  INTERVENTION:   -Initiate 48 hour calorie count per MD request -Continue with liberalized diet of regular to promote oral intake -Continue Ensure Enlive po TID, each supplement provides 350 kcal and 20 grams of protein -Continue MVI with minerals daily -Magic cup TID with meals, each supplement provides 290 kcal and 9 grams of protein  NUTRITION DIAGNOSIS:   Increased nutrient needs related to post-op healing as evidenced by estimated needs.  Ongoing  GOAL:   Patient will meet greater than or equal to 90% of their needs  Progressing   MONITOR:   PO intake, Supplement acceptance, Labs, Weight trends  REASON FOR ASSESSMENT:   Consult Calorie Count  ASSESSMENT:   63 yo female admitted post fall with right femur fracture. PMH includes DM, HTN, CVA  12/24- s/p PROCEDURE: Treatment of intertrochanteric, pertrochanteric, subtrochanteric fracture with intramedullary implant. CPT [phone]   Reviewed I/O's: +5 L x 24 hours and +3.1 L since admission  UOP: 1.2 L x 24 hours  Calorie count requested by MD due to prior hypoglycemic episodes and poor oral intake.   Spoke with pt at bedside, who was slow to respond and had a flat affect. She reports she was eating well PTA and "I'm doing the best I can here". Pt was not very talkative with this RD; RD attempted to probe pt regarding food preferences and chewing and swallowing difficulties. Offered to help set up her tray to eat more, however, pt refused. Pt reports she is consuming Ensure supplements and "drinking them a lot". Meal completion 10-25%.  Pt breakfast tray was untouched. She consumed about 25% of a bottle of coconut water (about 23 kcals).   10/23/19 Breakfast: 144 kcals, 4 grams protein Lunch: 125 kcals, 4 grams protein Dinner: 59 kcals, 3 grams protein Supplements: 3 Ensure Enlive supplements  Total intake: 1378 kcal (81% of minimum estimated needs)  71  grams protein (84% of minimum estimated needs)  Encouraged pt to consume as much as she could off meal trays to promote healing. RD also encouraged continued consumption of Ensure supplements.   Plan for SNF when bed available.  Labs reviewed: CBGS: 178-195 (inpatient orders for glycemic control are 0-15 units insulin aspart TID with meals and 0-5 units insulin aspart q HS).   Diet Order:   Diet Order            Diet regular Room service appropriate? Yes; Fluid consistency: Thin  Diet effective now              EDUCATION NEEDS:   Not appropriate for education at this time  Skin:  Skin Assessment: Skin Integrity Issues: Skin Integrity Issues:: Incisions Incisions: closed rt leg  Last BM:  Unknown  Height:   Ht Readings from Last 1 Encounters:  10/17/19 5\' 4"  (1.626 m)    Weight:   Wt Readings from Last 1 Encounters:  08/08/19 77 kg    Ideal Body Weight:  54.5 kg  BMI:  Body mass index is 29.14 kg/m.  Estimated Nutritional Needs:   Kcal:  1700-1900 kcals  Protein:  85-95 g  Fluid:  >/= 1.7    Lauren Waller, RD, LDN, [place] Registered Dietitian II Certified Diabetes Care and Education Specialist Pager: [phone] After hours Pager: [phone]

## 2019-10-24 NOTE — Progress Notes (Signed)
15:35 - Patient was discharged via [facility] to SNF.  Report was called to [place].  Discharge instructions, face sheet, printed prescriptions, including signed Norco prescription, and necessity for medical transportation was given to the [facility] driver.  Patient denied having any additional questions

## 2019-10-24 NOTE — Discharge Summary (Addendum)
Physician Discharge Summary  Patient ID: Lauren Waller MRN: [id number] DOB/AGE: 62-07-1956 62 y.o.  Admit date: 10/17/2019 Discharge date: 10/24/2019  Admission Diagnoses:  Discharge Diagnoses:  Principal Problem:   Intertrochanteric fracture of right femur (HCC) Active Problems:   Diabetes mellitus without complication (HCC)   Hypertension   HLD (hyperlipidemia)   Hypertensive urgency   History of CVA with residual deficit   Hypoalbuminemia   Normocytic anemia   Fall   Discharged Condition: stable  Hospital Course: Patient is a 63 year old African-American female with past medical history significant for hypertension, hyperlipidemia, CVA, type 2 diabetes mellitus, depression and low back pain.  Patient presented to the hospital following a fall with associated 2-day history of right hip pain. X-rays done on admission revealed right communited intertrochanteric femoral fracture.  Patient was admitted for further assessment and management.  Orthopedics team was consulted and patient underwent intramedullary nail placement on 10/17/2019.  Orthopedics team directed patient's management during the hospital stay.  The orthopedic team has cleared patient for discharge.  Significantly, patient is noted to have poor p.o. intake.  Patient has had episodes of low blood sugar.  There have been need to continue calorie count on discharge.  Also monitor fluid intake.  Patient will be discharged to the care of the primary care provider and the orthopedic team.   Right femur communited intertrochanteric fracture: -S/p intramedullary nail placement by orthopedics on 10/17/2019 -Weightbearing as tolerated recommended by orthopedics -Continue to optimize pain control.   -Orthopedic team has cleared patient for discharge.   -Patient will be discharged to skilled nursing facility for further physical therapy and Occupational Therapy.    Hypertensive urgency: Resolved.  Continue to monitor  closely. Goal blood pressure should be below 130/80 mmHg.  Hypokalemia: Continue to monitor and replete.   History of CVA in August 21, 2019: Stable.  Hyperlipidemia: Continue statin.   History of cervical spinal stenosis and degenerative disc disease: Pain control.  Continue physical therapy.    Type 2 diabetes mellitus: -Poor p.o. intake is noted.   -Results of low blood sugar noted.   -We will hold subcutaneous Lantus and glipizide on discharge.   -Continue sliding scale insulin coverage.   -Continue caloric count.     CBG (last 3)  Recent Labs (last 2 labs)        Recent Labs    10/23/19 1708 10/23/19 1756 10/23/19 1903  GLUCAP 53* 179* 186*      Anemia:   Likely acute blood loss anemia from surgery  Stool for occult blood was negative.  Patient will be discharged on supplemental iron therapy. Continue to monitor.   Constipation:  Started him on senna, colace and miralax.    Consults: orthopedic surgery  Significant Diagnostic Studies:  X-ray of the right hip revealed: 1. Right femur comminuted intertrochanteric fracture with mild varus impaction. 2. No superimposed pelvis fracture identified.  CT of the cervical spine without contrast revealed: 1. No acute traumatic injury identified in the cervical spine. 2. 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 If there are myelopathic symptoms note that this degree of stenosis could predispose to spinal cord injury in a fall.  CT of the head without contrast revealed: 1. No acute traumatic injury or acute intracranial abnormality identified. 2. 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 Underlying chronic small vessel disease.  Treatments: Treatment of intertrochanteric, pertrochanteric, subtrochanteric fracture with intramedullary implant.  Discharge  Exam: Blood pressure (!) 123/59, pulse 80, temperature 97.9 F (36.6 C), temperature source Oral, resp. rate 16, height 5\' 4"  (1.626 m), SpO2 96 %.  Disposition: Discharge disposition: 03-Skilled Nursing Facility  Discharge Instructions    Diet - low sodium heart healthy   Complete by: As directed    Diet Carb Modified   Complete by: As directed    Increase activity slowly   Complete by: As directed      Allergies as of 10/24/2019      Reactions   Januvia [sitagliptin] Swelling   Metformin And Related Nausea And Vomiting      Medication List    STOP taking these medications   bisacodyl 10 MG suppository Commonly known as: DULCOLAX   cloNIDine 0.1 MG tablet Commonly known as: CATAPRES   glipiZIDE 2.5 MG 24 hr tablet Commonly known as: GLUCOTROL XL   insulin glargine 100 UNIT/ML injection Commonly known as: LANTUS   NON FORMULARY   ondansetron 4 MG tablet Commonly known as: ZOFRAN   traMADol 50 MG tablet Commonly known as: ULTRAM     TAKE these medications   amLODipine 10 MG tablet Commonly known as: NORVASC Take 10 mg by mouth daily.   aspirin 81 MG EC tablet Take 1 tablet (81 mg total) by mouth daily.   atenolol 100 MG tablet Commonly known as: TENORMIN Take 1 tablet by mouth daily   atorvastatin 40 MG tablet Commonly known as: LIPITOR Take 1 tablet (40 mg total) by mouth daily.   B-complex with vitamin C tablet Take 1 tablet by mouth daily.   cloNIDine 0.2 mg/24hr patch Commonly known as: CATAPRES - Dosed in mg/24 hr Place 0.2 mg onto the skin once a week. Saturday   clopidogrel 75 MG tablet Commonly known as: PLAVIX Take 1 tablet (75 mg total) by mouth daily.   enoxaparin 40 MG/0.4ML injection Commonly known as: LOVENOX Inject 0.4 mLs (40 mg total) into the skin daily for 21 days.   ferrous sulfate 325 (65 FE) MG tablet Take 1 tablet (325 mg total) by mouth 2 (two) times daily with a meal.   Glucerna Liqd Take 237 mLs by mouth 2 (two)  times daily between meals.   hydrALAZINE 100 MG tablet Commonly known as: APRESOLINE Take 1 tablet (100 mg total) by mouth every 8 (eight) hours.   HYDROcodone-acetaminophen 5-325 MG tablet Commonly known as: NORCO/VICODIN Take 1 tablet by mouth every 6 (six) hours as needed for moderate pain.   insulin aspart 100 UNIT/ML injection Commonly known as: novoLOG Inject 0-15 Units into the skin 3 (three) times daily with meals. What changed:   how much to take  when to take this  additional instructions   megestrol 400 MG/10ML suspension Commonly known as: MEGACE Take 10 mLs (400 mg total) by mouth 2 (two) times daily.   multivitamin with minerals Tabs tablet Take 1 tablet by mouth daily. Start taking on: October 25, 2019   pantoprazole 20 MG tablet Commonly known as: PROTONIX Take 40 mg by mouth daily.   polyethylene glycol 17 g packet Commonly known as: MIRALAX / GLYCOLAX Take 17 g by mouth daily. Start taking on: October 25, 2019   senna-docusate 8.6-50 MG tablet Commonly known as: Senokot-S Take 2 tablets by mouth 2 (two) times daily. What changed:   how much to take  when to take this  reasons to take this      Follow-up Information    Yolonda Kidaogers, Jason Patrick, MD In 2 weeks.  Specialty: Orthopedic Surgery Why: For suture removal, For wound re-check Contact information: [address] [place] [address] [phone]           Signed: Bonnell Public 10/24/2019, 2:42 PM

## 2019-10-24 NOTE — TOC Transition Note (Addendum)
Transition of Care [facility]) - CM/SW Discharge Note   Patient Details  Name: Lauren Waller MRN: [id number] Date of Birth: [birth year]  [facility] of [facility]) CM/SW Contact:  Atilano Median, LCSW Phone Number: 10/24/2019, 2:36 PM   Clinical Narrative:    Discharged to [facility]. Referral coordinated with Bryson Ha. Patient's niece Darylene Price aware and agreeable to this plan. Number to call report [phone] room 113B given to unit RN Marcene Brawn. No other needs at this time. Case closed to this CSW.   Final next level of care: Skilled Nursing Facility Barriers to Discharge: Barriers Resolved   Patient Goals and CMS Choice   CMS Medicare.gov Compare Post Acute Care list provided to:: Patient Represenative (must comment)(Nicola, niece) Choice offered to / list presented to : [facility] POA / Guardian  Discharge Placement   Existing PASRR number confirmed : 10/24/19          Patient chooses bed at: Other - please specify in the comment section below:([facility] ) Patient to be transferred to facility by: [facility] Name of family member notified: Darylene Price Patient and family notified of of transfer: 10/24/19  Discharge Plan and Services                                     Social Determinants of Health (SDOH) Interventions     Readmission Risk Interventions No flowsheet data found.

## 2019-10-24 NOTE — Care Management (Signed)
Bed offered and accepted at [facility]. CSW informed MD at 10:04.   CSW received call/text from [facility] at 1:50 that the patient needed to be at the facility by 2pm. Unable to dc today due to time. Will need a new COVID due to prior one not being within 48 hours of discharge.   MD has been informed.

## 2019-10-24 NOTE — Plan of Care (Signed)
  Problem: Education: Goal: Knowledge of General Education information will improve Description: Including pain rating scale, medication(s)/side effects and non-pharmacologic comfort measures Outcome: Adequate for Discharge   Problem: Health Behavior/Discharge Planning: Goal: Ability to manage health-related needs will improve Outcome: Adequate for Discharge   Problem: Clinical Measurements: Goal: Will remain free from infection Outcome: Adequate for Discharge Goal: Respiratory complications will improve Outcome: Adequate for Discharge Goal: Cardiovascular complication will be avoided Outcome: Adequate for Discharge   Problem: Nutrition: Goal: Adequate nutrition will be maintained Outcome: Adequate for Discharge   Problem: Coping: Goal: Level of anxiety will decrease Outcome: Adequate for Discharge   Problem: Elimination: Goal: Will not experience complications related to bowel motility Outcome: Adequate for Discharge Goal: Will not experience complications related to urinary retention Outcome: Adequate for Discharge   Problem: Pain Managment: Goal: General experience of comfort will improve Outcome: Adequate for Discharge   Problem: Safety: Goal: Ability to remain free from injury will improve Outcome: Adequate for Discharge   Problem: Skin Integrity: Goal: Risk for impaired skin integrity will decrease Outcome: Adequate for Discharge   Problem: Acute Rehab PT Goals(only PT should resolve) Goal: Pt Will Go Supine/Side To Sit Outcome: Adequate for Discharge Goal: Pt Will Go Sit To Supine/Side Outcome: Adequate for Discharge Goal: Patient Will Transfer Sit To/From Stand Outcome: Adequate for Discharge Goal: Pt Will Ambulate Outcome: Adequate for Discharge   Problem: Acute Rehab OT Goals (only OT should resolve) Goal: Pt. Will Transfer To Toilet Outcome: Adequate for Discharge Goal: OT Additional ADL Goal #1 Outcome: Adequate for Discharge

## 2019-10-28 DIAGNOSIS — I10 Essential (primary) hypertension: Secondary | ICD-10-CM | POA: Diagnosis not present

## 2019-10-28 DIAGNOSIS — I2510 Atherosclerotic heart disease of native coronary artery without angina pectoris: Secondary | ICD-10-CM | POA: Diagnosis not present

## 2019-10-28 DIAGNOSIS — E10649 Type 1 diabetes mellitus with hypoglycemia without coma: Secondary | ICD-10-CM | POA: Diagnosis not present

## 2019-10-28 DIAGNOSIS — S72091A Other fracture of head and neck of right femur, initial encounter for closed fracture: Secondary | ICD-10-CM | POA: Diagnosis not present

## 2019-10-28 DIAGNOSIS — D649 Anemia, unspecified: Secondary | ICD-10-CM | POA: Diagnosis not present

## 2019-10-28 DIAGNOSIS — E119 Type 2 diabetes mellitus without complications: Secondary | ICD-10-CM | POA: Diagnosis not present

## 2019-10-28 DIAGNOSIS — R5381 Other malaise: Secondary | ICD-10-CM | POA: Diagnosis not present

## 2019-10-29 DIAGNOSIS — S72091A Other fracture of head and neck of right femur, initial encounter for closed fracture: Secondary | ICD-10-CM | POA: Diagnosis not present

## 2019-10-29 DIAGNOSIS — I10 Essential (primary) hypertension: Secondary | ICD-10-CM | POA: Diagnosis not present

## 2019-10-29 DIAGNOSIS — E785 Hyperlipidemia, unspecified: Secondary | ICD-10-CM | POA: Diagnosis not present

## 2019-10-29 DIAGNOSIS — I2510 Atherosclerotic heart disease of native coronary artery without angina pectoris: Secondary | ICD-10-CM | POA: Diagnosis not present

## 2019-11-01 DIAGNOSIS — E785 Hyperlipidemia, unspecified: Secondary | ICD-10-CM | POA: Diagnosis not present

## 2019-11-01 DIAGNOSIS — S72091A Other fracture of head and neck of right femur, initial encounter for closed fracture: Secondary | ICD-10-CM | POA: Diagnosis not present

## 2019-11-01 DIAGNOSIS — R5381 Other malaise: Secondary | ICD-10-CM | POA: Diagnosis not present

## 2019-11-01 DIAGNOSIS — I10 Essential (primary) hypertension: Secondary | ICD-10-CM | POA: Diagnosis not present

## 2019-11-04 DIAGNOSIS — D649 Anemia, unspecified: Secondary | ICD-10-CM | POA: Diagnosis not present

## 2019-11-04 DIAGNOSIS — S72091A Other fracture of head and neck of right femur, initial encounter for closed fracture: Secondary | ICD-10-CM | POA: Diagnosis not present

## 2019-11-11 DIAGNOSIS — I69315 Cognitive social or emotional deficit following cerebral infarction: Secondary | ICD-10-CM | POA: Diagnosis not present

## 2019-11-11 DIAGNOSIS — M6281 Muscle weakness (generalized): Secondary | ICD-10-CM | POA: Diagnosis not present

## 2019-11-11 DIAGNOSIS — S72141D Displaced intertrochanteric fracture of right femur, subsequent encounter for closed fracture with routine healing: Secondary | ICD-10-CM | POA: Diagnosis not present

## 2019-11-11 DIAGNOSIS — R2689 Other abnormalities of gait and mobility: Secondary | ICD-10-CM | POA: Diagnosis not present

## 2019-11-11 DIAGNOSIS — Z741 Need for assistance with personal care: Secondary | ICD-10-CM | POA: Diagnosis not present

## 2019-11-11 DIAGNOSIS — R262 Difficulty in walking, not elsewhere classified: Secondary | ICD-10-CM | POA: Diagnosis not present

## 2019-11-11 DIAGNOSIS — M5030 Other cervical disc degeneration, unspecified cervical region: Secondary | ICD-10-CM | POA: Diagnosis not present

## 2019-11-11 DIAGNOSIS — I6330 Cerebral infarction due to thrombosis of unspecified cerebral artery: Secondary | ICD-10-CM | POA: Diagnosis not present

## 2019-11-11 DIAGNOSIS — R278 Other lack of coordination: Secondary | ICD-10-CM | POA: Diagnosis not present

## 2019-11-12 DIAGNOSIS — I69315 Cognitive social or emotional deficit following cerebral infarction: Secondary | ICD-10-CM | POA: Diagnosis not present

## 2019-11-12 DIAGNOSIS — M5030 Other cervical disc degeneration, unspecified cervical region: Secondary | ICD-10-CM | POA: Diagnosis not present

## 2019-11-12 DIAGNOSIS — Z741 Need for assistance with personal care: Secondary | ICD-10-CM | POA: Diagnosis not present

## 2019-11-12 DIAGNOSIS — R262 Difficulty in walking, not elsewhere classified: Secondary | ICD-10-CM | POA: Diagnosis not present

## 2019-11-12 DIAGNOSIS — R2689 Other abnormalities of gait and mobility: Secondary | ICD-10-CM | POA: Diagnosis not present

## 2019-11-12 DIAGNOSIS — R278 Other lack of coordination: Secondary | ICD-10-CM | POA: Diagnosis not present

## 2019-11-12 DIAGNOSIS — S72141D Displaced intertrochanteric fracture of right femur, subsequent encounter for closed fracture with routine healing: Secondary | ICD-10-CM | POA: Diagnosis not present

## 2019-11-12 DIAGNOSIS — I6330 Cerebral infarction due to thrombosis of unspecified cerebral artery: Secondary | ICD-10-CM | POA: Diagnosis not present

## 2019-11-12 DIAGNOSIS — M6281 Muscle weakness (generalized): Secondary | ICD-10-CM | POA: Diagnosis not present

## 2019-11-13 DIAGNOSIS — M5030 Other cervical disc degeneration, unspecified cervical region: Secondary | ICD-10-CM | POA: Diagnosis not present

## 2019-11-13 DIAGNOSIS — Z741 Need for assistance with personal care: Secondary | ICD-10-CM | POA: Diagnosis not present

## 2019-11-13 DIAGNOSIS — R2689 Other abnormalities of gait and mobility: Secondary | ICD-10-CM | POA: Diagnosis not present

## 2019-11-13 DIAGNOSIS — S72141D Displaced intertrochanteric fracture of right femur, subsequent encounter for closed fracture with routine healing: Secondary | ICD-10-CM | POA: Diagnosis not present

## 2019-11-13 DIAGNOSIS — M6281 Muscle weakness (generalized): Secondary | ICD-10-CM | POA: Diagnosis not present

## 2019-11-13 DIAGNOSIS — R278 Other lack of coordination: Secondary | ICD-10-CM | POA: Diagnosis not present

## 2019-11-13 DIAGNOSIS — I69315 Cognitive social or emotional deficit following cerebral infarction: Secondary | ICD-10-CM | POA: Diagnosis not present

## 2019-11-13 DIAGNOSIS — R262 Difficulty in walking, not elsewhere classified: Secondary | ICD-10-CM | POA: Diagnosis not present

## 2019-11-13 DIAGNOSIS — I6330 Cerebral infarction due to thrombosis of unspecified cerebral artery: Secondary | ICD-10-CM | POA: Diagnosis not present

## 2019-11-14 DIAGNOSIS — M5030 Other cervical disc degeneration, unspecified cervical region: Secondary | ICD-10-CM | POA: Diagnosis not present

## 2019-11-14 DIAGNOSIS — S72141D Displaced intertrochanteric fracture of right femur, subsequent encounter for closed fracture with routine healing: Secondary | ICD-10-CM | POA: Diagnosis not present

## 2019-11-14 DIAGNOSIS — M6281 Muscle weakness (generalized): Secondary | ICD-10-CM | POA: Diagnosis not present

## 2019-11-14 DIAGNOSIS — Z741 Need for assistance with personal care: Secondary | ICD-10-CM | POA: Diagnosis not present

## 2019-11-14 DIAGNOSIS — R262 Difficulty in walking, not elsewhere classified: Secondary | ICD-10-CM | POA: Diagnosis not present

## 2019-11-14 DIAGNOSIS — I69315 Cognitive social or emotional deficit following cerebral infarction: Secondary | ICD-10-CM | POA: Diagnosis not present

## 2019-11-14 DIAGNOSIS — R278 Other lack of coordination: Secondary | ICD-10-CM | POA: Diagnosis not present

## 2019-11-14 DIAGNOSIS — I6330 Cerebral infarction due to thrombosis of unspecified cerebral artery: Secondary | ICD-10-CM | POA: Diagnosis not present

## 2019-11-14 DIAGNOSIS — R2689 Other abnormalities of gait and mobility: Secondary | ICD-10-CM | POA: Diagnosis not present

## 2019-11-15 DIAGNOSIS — R2689 Other abnormalities of gait and mobility: Secondary | ICD-10-CM | POA: Diagnosis not present

## 2019-11-15 DIAGNOSIS — I6330 Cerebral infarction due to thrombosis of unspecified cerebral artery: Secondary | ICD-10-CM | POA: Diagnosis not present

## 2019-11-15 DIAGNOSIS — D649 Anemia, unspecified: Secondary | ICD-10-CM | POA: Diagnosis not present

## 2019-11-15 DIAGNOSIS — M6281 Muscle weakness (generalized): Secondary | ICD-10-CM | POA: Diagnosis not present

## 2019-11-15 DIAGNOSIS — R262 Difficulty in walking, not elsewhere classified: Secondary | ICD-10-CM | POA: Diagnosis not present

## 2019-11-15 DIAGNOSIS — I69315 Cognitive social or emotional deficit following cerebral infarction: Secondary | ICD-10-CM | POA: Diagnosis not present

## 2019-11-15 DIAGNOSIS — S72141D Displaced intertrochanteric fracture of right femur, subsequent encounter for closed fracture with routine healing: Secondary | ICD-10-CM | POA: Diagnosis not present

## 2019-11-15 DIAGNOSIS — Z741 Need for assistance with personal care: Secondary | ICD-10-CM | POA: Diagnosis not present

## 2019-11-15 DIAGNOSIS — M5030 Other cervical disc degeneration, unspecified cervical region: Secondary | ICD-10-CM | POA: Diagnosis not present

## 2019-11-15 DIAGNOSIS — R278 Other lack of coordination: Secondary | ICD-10-CM | POA: Diagnosis not present

## 2019-11-15 DIAGNOSIS — S72091A Other fracture of head and neck of right femur, initial encounter for closed fracture: Secondary | ICD-10-CM | POA: Diagnosis not present

## 2019-11-18 DIAGNOSIS — M6281 Muscle weakness (generalized): Secondary | ICD-10-CM | POA: Diagnosis not present

## 2019-11-18 DIAGNOSIS — D649 Anemia, unspecified: Secondary | ICD-10-CM | POA: Diagnosis not present

## 2019-11-18 DIAGNOSIS — E118 Type 2 diabetes mellitus with unspecified complications: Secondary | ICD-10-CM | POA: Diagnosis not present

## 2019-11-18 DIAGNOSIS — I6330 Cerebral infarction due to thrombosis of unspecified cerebral artery: Secondary | ICD-10-CM | POA: Diagnosis not present

## 2019-11-18 DIAGNOSIS — I69315 Cognitive social or emotional deficit following cerebral infarction: Secondary | ICD-10-CM | POA: Diagnosis not present

## 2019-11-18 DIAGNOSIS — S72141D Displaced intertrochanteric fracture of right femur, subsequent encounter for closed fracture with routine healing: Secondary | ICD-10-CM | POA: Diagnosis not present

## 2019-11-18 DIAGNOSIS — R262 Difficulty in walking, not elsewhere classified: Secondary | ICD-10-CM | POA: Diagnosis not present

## 2019-11-19 DIAGNOSIS — Z741 Need for assistance with personal care: Secondary | ICD-10-CM | POA: Diagnosis not present

## 2019-11-19 DIAGNOSIS — R262 Difficulty in walking, not elsewhere classified: Secondary | ICD-10-CM | POA: Diagnosis not present

## 2019-11-19 DIAGNOSIS — I69315 Cognitive social or emotional deficit following cerebral infarction: Secondary | ICD-10-CM | POA: Diagnosis not present

## 2019-11-19 DIAGNOSIS — S72141D Displaced intertrochanteric fracture of right femur, subsequent encounter for closed fracture with routine healing: Secondary | ICD-10-CM | POA: Diagnosis not present

## 2019-11-19 DIAGNOSIS — M6281 Muscle weakness (generalized): Secondary | ICD-10-CM | POA: Diagnosis not present

## 2019-11-19 DIAGNOSIS — I6330 Cerebral infarction due to thrombosis of unspecified cerebral artery: Secondary | ICD-10-CM | POA: Diagnosis not present

## 2019-11-19 DIAGNOSIS — R2689 Other abnormalities of gait and mobility: Secondary | ICD-10-CM | POA: Diagnosis not present

## 2019-11-19 DIAGNOSIS — R278 Other lack of coordination: Secondary | ICD-10-CM | POA: Diagnosis not present

## 2019-11-19 DIAGNOSIS — M5030 Other cervical disc degeneration, unspecified cervical region: Secondary | ICD-10-CM | POA: Diagnosis not present

## 2019-11-20 DIAGNOSIS — I6330 Cerebral infarction due to thrombosis of unspecified cerebral artery: Secondary | ICD-10-CM | POA: Diagnosis not present

## 2019-11-20 DIAGNOSIS — Z741 Need for assistance with personal care: Secondary | ICD-10-CM | POA: Diagnosis not present

## 2019-11-20 DIAGNOSIS — R2689 Other abnormalities of gait and mobility: Secondary | ICD-10-CM | POA: Diagnosis not present

## 2019-11-20 DIAGNOSIS — S72141D Displaced intertrochanteric fracture of right femur, subsequent encounter for closed fracture with routine healing: Secondary | ICD-10-CM | POA: Diagnosis not present

## 2019-11-20 DIAGNOSIS — I69315 Cognitive social or emotional deficit following cerebral infarction: Secondary | ICD-10-CM | POA: Diagnosis not present

## 2019-11-20 DIAGNOSIS — R262 Difficulty in walking, not elsewhere classified: Secondary | ICD-10-CM | POA: Diagnosis not present

## 2019-11-20 DIAGNOSIS — M5030 Other cervical disc degeneration, unspecified cervical region: Secondary | ICD-10-CM | POA: Diagnosis not present

## 2019-11-20 DIAGNOSIS — R278 Other lack of coordination: Secondary | ICD-10-CM | POA: Diagnosis not present

## 2019-11-20 DIAGNOSIS — M6281 Muscle weakness (generalized): Secondary | ICD-10-CM | POA: Diagnosis not present

## 2019-11-21 DIAGNOSIS — M5030 Other cervical disc degeneration, unspecified cervical region: Secondary | ICD-10-CM | POA: Diagnosis not present

## 2019-11-21 DIAGNOSIS — I6330 Cerebral infarction due to thrombosis of unspecified cerebral artery: Secondary | ICD-10-CM | POA: Diagnosis not present

## 2019-11-21 DIAGNOSIS — S72141D Displaced intertrochanteric fracture of right femur, subsequent encounter for closed fracture with routine healing: Secondary | ICD-10-CM | POA: Diagnosis not present

## 2019-11-21 DIAGNOSIS — R262 Difficulty in walking, not elsewhere classified: Secondary | ICD-10-CM | POA: Diagnosis not present

## 2019-11-21 DIAGNOSIS — I69315 Cognitive social or emotional deficit following cerebral infarction: Secondary | ICD-10-CM | POA: Diagnosis not present

## 2019-11-21 DIAGNOSIS — M6281 Muscle weakness (generalized): Secondary | ICD-10-CM | POA: Diagnosis not present

## 2019-11-21 DIAGNOSIS — R2689 Other abnormalities of gait and mobility: Secondary | ICD-10-CM | POA: Diagnosis not present

## 2019-11-21 DIAGNOSIS — R278 Other lack of coordination: Secondary | ICD-10-CM | POA: Diagnosis not present

## 2019-11-21 DIAGNOSIS — Z741 Need for assistance with personal care: Secondary | ICD-10-CM | POA: Diagnosis not present

## 2019-11-22 DIAGNOSIS — R278 Other lack of coordination: Secondary | ICD-10-CM | POA: Diagnosis not present

## 2019-11-22 DIAGNOSIS — R2689 Other abnormalities of gait and mobility: Secondary | ICD-10-CM | POA: Diagnosis not present

## 2019-11-22 DIAGNOSIS — R262 Difficulty in walking, not elsewhere classified: Secondary | ICD-10-CM | POA: Diagnosis not present

## 2019-11-22 DIAGNOSIS — Z741 Need for assistance with personal care: Secondary | ICD-10-CM | POA: Diagnosis not present

## 2019-11-22 DIAGNOSIS — I6330 Cerebral infarction due to thrombosis of unspecified cerebral artery: Secondary | ICD-10-CM | POA: Diagnosis not present

## 2019-11-22 DIAGNOSIS — I69315 Cognitive social or emotional deficit following cerebral infarction: Secondary | ICD-10-CM | POA: Diagnosis not present

## 2019-11-22 DIAGNOSIS — M5030 Other cervical disc degeneration, unspecified cervical region: Secondary | ICD-10-CM | POA: Diagnosis not present

## 2019-11-22 DIAGNOSIS — S72141D Displaced intertrochanteric fracture of right femur, subsequent encounter for closed fracture with routine healing: Secondary | ICD-10-CM | POA: Diagnosis not present

## 2019-11-22 DIAGNOSIS — M6281 Muscle weakness (generalized): Secondary | ICD-10-CM | POA: Diagnosis not present

## 2019-11-25 DIAGNOSIS — Z741 Need for assistance with personal care: Secondary | ICD-10-CM | POA: Diagnosis not present

## 2019-11-25 DIAGNOSIS — M5030 Other cervical disc degeneration, unspecified cervical region: Secondary | ICD-10-CM | POA: Diagnosis not present

## 2019-11-25 DIAGNOSIS — R2689 Other abnormalities of gait and mobility: Secondary | ICD-10-CM | POA: Diagnosis not present

## 2019-11-25 DIAGNOSIS — R278 Other lack of coordination: Secondary | ICD-10-CM | POA: Diagnosis not present

## 2019-11-25 DIAGNOSIS — S72141D Displaced intertrochanteric fracture of right femur, subsequent encounter for closed fracture with routine healing: Secondary | ICD-10-CM | POA: Diagnosis not present

## 2019-11-25 DIAGNOSIS — I69315 Cognitive social or emotional deficit following cerebral infarction: Secondary | ICD-10-CM | POA: Diagnosis not present

## 2019-11-25 DIAGNOSIS — M6281 Muscle weakness (generalized): Secondary | ICD-10-CM | POA: Diagnosis not present

## 2019-11-25 DIAGNOSIS — I6330 Cerebral infarction due to thrombosis of unspecified cerebral artery: Secondary | ICD-10-CM | POA: Diagnosis not present

## 2019-11-25 DIAGNOSIS — R262 Difficulty in walking, not elsewhere classified: Secondary | ICD-10-CM | POA: Diagnosis not present

## 2019-11-26 DIAGNOSIS — Z741 Need for assistance with personal care: Secondary | ICD-10-CM | POA: Diagnosis not present

## 2019-11-26 DIAGNOSIS — M5030 Other cervical disc degeneration, unspecified cervical region: Secondary | ICD-10-CM | POA: Diagnosis not present

## 2019-11-26 DIAGNOSIS — I69315 Cognitive social or emotional deficit following cerebral infarction: Secondary | ICD-10-CM | POA: Diagnosis not present

## 2019-11-26 DIAGNOSIS — S72141D Displaced intertrochanteric fracture of right femur, subsequent encounter for closed fracture with routine healing: Secondary | ICD-10-CM | POA: Diagnosis not present

## 2019-11-26 DIAGNOSIS — I6330 Cerebral infarction due to thrombosis of unspecified cerebral artery: Secondary | ICD-10-CM | POA: Diagnosis not present

## 2019-11-26 DIAGNOSIS — M6281 Muscle weakness (generalized): Secondary | ICD-10-CM | POA: Diagnosis not present

## 2019-11-26 DIAGNOSIS — R278 Other lack of coordination: Secondary | ICD-10-CM | POA: Diagnosis not present

## 2019-11-26 DIAGNOSIS — R262 Difficulty in walking, not elsewhere classified: Secondary | ICD-10-CM | POA: Diagnosis not present

## 2019-11-26 DIAGNOSIS — R2689 Other abnormalities of gait and mobility: Secondary | ICD-10-CM | POA: Diagnosis not present

## 2019-11-27 DIAGNOSIS — I6330 Cerebral infarction due to thrombosis of unspecified cerebral artery: Secondary | ICD-10-CM | POA: Diagnosis not present

## 2019-11-27 DIAGNOSIS — S72141D Displaced intertrochanteric fracture of right femur, subsequent encounter for closed fracture with routine healing: Secondary | ICD-10-CM | POA: Diagnosis not present

## 2019-11-27 DIAGNOSIS — M5030 Other cervical disc degeneration, unspecified cervical region: Secondary | ICD-10-CM | POA: Diagnosis not present

## 2019-11-27 DIAGNOSIS — Z741 Need for assistance with personal care: Secondary | ICD-10-CM | POA: Diagnosis not present

## 2019-11-27 DIAGNOSIS — R2689 Other abnormalities of gait and mobility: Secondary | ICD-10-CM | POA: Diagnosis not present

## 2019-11-27 DIAGNOSIS — R262 Difficulty in walking, not elsewhere classified: Secondary | ICD-10-CM | POA: Diagnosis not present

## 2019-11-27 DIAGNOSIS — I69315 Cognitive social or emotional deficit following cerebral infarction: Secondary | ICD-10-CM | POA: Diagnosis not present

## 2019-11-27 DIAGNOSIS — R278 Other lack of coordination: Secondary | ICD-10-CM | POA: Diagnosis not present

## 2019-11-27 DIAGNOSIS — M6281 Muscle weakness (generalized): Secondary | ICD-10-CM | POA: Diagnosis not present

## 2019-11-28 DIAGNOSIS — Z741 Need for assistance with personal care: Secondary | ICD-10-CM | POA: Diagnosis not present

## 2019-11-28 DIAGNOSIS — M5030 Other cervical disc degeneration, unspecified cervical region: Secondary | ICD-10-CM | POA: Diagnosis not present

## 2019-11-28 DIAGNOSIS — R262 Difficulty in walking, not elsewhere classified: Secondary | ICD-10-CM | POA: Diagnosis not present

## 2019-11-28 DIAGNOSIS — M6281 Muscle weakness (generalized): Secondary | ICD-10-CM | POA: Diagnosis not present

## 2019-11-28 DIAGNOSIS — R2689 Other abnormalities of gait and mobility: Secondary | ICD-10-CM | POA: Diagnosis not present

## 2019-11-28 DIAGNOSIS — I69315 Cognitive social or emotional deficit following cerebral infarction: Secondary | ICD-10-CM | POA: Diagnosis not present

## 2019-11-28 DIAGNOSIS — I6330 Cerebral infarction due to thrombosis of unspecified cerebral artery: Secondary | ICD-10-CM | POA: Diagnosis not present

## 2019-11-28 DIAGNOSIS — S72141D Displaced intertrochanteric fracture of right femur, subsequent encounter for closed fracture with routine healing: Secondary | ICD-10-CM | POA: Diagnosis not present

## 2019-11-28 DIAGNOSIS — R278 Other lack of coordination: Secondary | ICD-10-CM | POA: Diagnosis not present

## 2019-11-29 DIAGNOSIS — R262 Difficulty in walking, not elsewhere classified: Secondary | ICD-10-CM | POA: Diagnosis not present

## 2019-11-29 DIAGNOSIS — R278 Other lack of coordination: Secondary | ICD-10-CM | POA: Diagnosis not present

## 2019-11-29 DIAGNOSIS — M6281 Muscle weakness (generalized): Secondary | ICD-10-CM | POA: Diagnosis not present

## 2019-11-29 DIAGNOSIS — I69315 Cognitive social or emotional deficit following cerebral infarction: Secondary | ICD-10-CM | POA: Diagnosis not present

## 2019-11-29 DIAGNOSIS — S72141D Displaced intertrochanteric fracture of right femur, subsequent encounter for closed fracture with routine healing: Secondary | ICD-10-CM | POA: Diagnosis not present

## 2019-11-29 DIAGNOSIS — M5030 Other cervical disc degeneration, unspecified cervical region: Secondary | ICD-10-CM | POA: Diagnosis not present

## 2019-11-29 DIAGNOSIS — Z741 Need for assistance with personal care: Secondary | ICD-10-CM | POA: Diagnosis not present

## 2019-11-29 DIAGNOSIS — I6330 Cerebral infarction due to thrombosis of unspecified cerebral artery: Secondary | ICD-10-CM | POA: Diagnosis not present

## 2019-11-29 DIAGNOSIS — R2689 Other abnormalities of gait and mobility: Secondary | ICD-10-CM | POA: Diagnosis not present

## 2019-12-02 DIAGNOSIS — S72141D Displaced intertrochanteric fracture of right femur, subsequent encounter for closed fracture with routine healing: Secondary | ICD-10-CM | POA: Diagnosis not present

## 2019-12-02 DIAGNOSIS — R278 Other lack of coordination: Secondary | ICD-10-CM | POA: Diagnosis not present

## 2019-12-02 DIAGNOSIS — Z741 Need for assistance with personal care: Secondary | ICD-10-CM | POA: Diagnosis not present

## 2019-12-02 DIAGNOSIS — R2689 Other abnormalities of gait and mobility: Secondary | ICD-10-CM | POA: Diagnosis not present

## 2019-12-02 DIAGNOSIS — R262 Difficulty in walking, not elsewhere classified: Secondary | ICD-10-CM | POA: Diagnosis not present

## 2019-12-02 DIAGNOSIS — M5030 Other cervical disc degeneration, unspecified cervical region: Secondary | ICD-10-CM | POA: Diagnosis not present

## 2019-12-02 DIAGNOSIS — R5381 Other malaise: Secondary | ICD-10-CM | POA: Diagnosis not present

## 2019-12-02 DIAGNOSIS — I2510 Atherosclerotic heart disease of native coronary artery without angina pectoris: Secondary | ICD-10-CM | POA: Diagnosis not present

## 2019-12-02 DIAGNOSIS — I10 Essential (primary) hypertension: Secondary | ICD-10-CM | POA: Diagnosis not present

## 2019-12-02 DIAGNOSIS — I69315 Cognitive social or emotional deficit following cerebral infarction: Secondary | ICD-10-CM | POA: Diagnosis not present

## 2019-12-02 DIAGNOSIS — M6281 Muscle weakness (generalized): Secondary | ICD-10-CM | POA: Diagnosis not present

## 2019-12-02 DIAGNOSIS — I6330 Cerebral infarction due to thrombosis of unspecified cerebral artery: Secondary | ICD-10-CM | POA: Diagnosis not present

## 2019-12-02 DIAGNOSIS — S72091A Other fracture of head and neck of right femur, initial encounter for closed fracture: Secondary | ICD-10-CM | POA: Diagnosis not present

## 2019-12-03 DIAGNOSIS — R278 Other lack of coordination: Secondary | ICD-10-CM | POA: Diagnosis not present

## 2019-12-03 DIAGNOSIS — R262 Difficulty in walking, not elsewhere classified: Secondary | ICD-10-CM | POA: Diagnosis not present

## 2019-12-03 DIAGNOSIS — M5030 Other cervical disc degeneration, unspecified cervical region: Secondary | ICD-10-CM | POA: Diagnosis not present

## 2019-12-03 DIAGNOSIS — M6281 Muscle weakness (generalized): Secondary | ICD-10-CM | POA: Diagnosis not present

## 2019-12-03 DIAGNOSIS — I6330 Cerebral infarction due to thrombosis of unspecified cerebral artery: Secondary | ICD-10-CM | POA: Diagnosis not present

## 2019-12-03 DIAGNOSIS — Z741 Need for assistance with personal care: Secondary | ICD-10-CM | POA: Diagnosis not present

## 2019-12-03 DIAGNOSIS — R2689 Other abnormalities of gait and mobility: Secondary | ICD-10-CM | POA: Diagnosis not present

## 2019-12-03 DIAGNOSIS — S72141D Displaced intertrochanteric fracture of right femur, subsequent encounter for closed fracture with routine healing: Secondary | ICD-10-CM | POA: Diagnosis not present

## 2019-12-03 DIAGNOSIS — I69315 Cognitive social or emotional deficit following cerebral infarction: Secondary | ICD-10-CM | POA: Diagnosis not present

## 2019-12-04 DIAGNOSIS — R262 Difficulty in walking, not elsewhere classified: Secondary | ICD-10-CM | POA: Diagnosis not present

## 2019-12-04 DIAGNOSIS — Z4789 Encounter for other orthopedic aftercare: Secondary | ICD-10-CM | POA: Diagnosis not present

## 2019-12-04 DIAGNOSIS — I69315 Cognitive social or emotional deficit following cerebral infarction: Secondary | ICD-10-CM | POA: Diagnosis not present

## 2019-12-04 DIAGNOSIS — M6281 Muscle weakness (generalized): Secondary | ICD-10-CM | POA: Diagnosis not present

## 2019-12-04 DIAGNOSIS — I6330 Cerebral infarction due to thrombosis of unspecified cerebral artery: Secondary | ICD-10-CM | POA: Diagnosis not present

## 2019-12-04 DIAGNOSIS — S72141D Displaced intertrochanteric fracture of right femur, subsequent encounter for closed fracture with routine healing: Secondary | ICD-10-CM | POA: Diagnosis not present

## 2019-12-05 DIAGNOSIS — S72141D Displaced intertrochanteric fracture of right femur, subsequent encounter for closed fracture with routine healing: Secondary | ICD-10-CM | POA: Diagnosis not present

## 2019-12-05 DIAGNOSIS — M6281 Muscle weakness (generalized): Secondary | ICD-10-CM | POA: Diagnosis not present

## 2019-12-05 DIAGNOSIS — I69315 Cognitive social or emotional deficit following cerebral infarction: Secondary | ICD-10-CM | POA: Diagnosis not present

## 2019-12-05 DIAGNOSIS — I6330 Cerebral infarction due to thrombosis of unspecified cerebral artery: Secondary | ICD-10-CM | POA: Diagnosis not present

## 2019-12-05 DIAGNOSIS — M5030 Other cervical disc degeneration, unspecified cervical region: Secondary | ICD-10-CM | POA: Diagnosis not present

## 2019-12-05 DIAGNOSIS — Z741 Need for assistance with personal care: Secondary | ICD-10-CM | POA: Diagnosis not present

## 2019-12-05 DIAGNOSIS — R278 Other lack of coordination: Secondary | ICD-10-CM | POA: Diagnosis not present

## 2019-12-05 DIAGNOSIS — R2689 Other abnormalities of gait and mobility: Secondary | ICD-10-CM | POA: Diagnosis not present

## 2019-12-06 DIAGNOSIS — S72141D Displaced intertrochanteric fracture of right femur, subsequent encounter for closed fracture with routine healing: Secondary | ICD-10-CM | POA: Diagnosis not present

## 2019-12-06 DIAGNOSIS — I6330 Cerebral infarction due to thrombosis of unspecified cerebral artery: Secondary | ICD-10-CM | POA: Diagnosis not present

## 2019-12-06 DIAGNOSIS — I69315 Cognitive social or emotional deficit following cerebral infarction: Secondary | ICD-10-CM | POA: Diagnosis not present

## 2019-12-06 DIAGNOSIS — M6281 Muscle weakness (generalized): Secondary | ICD-10-CM | POA: Diagnosis not present

## 2019-12-06 DIAGNOSIS — R262 Difficulty in walking, not elsewhere classified: Secondary | ICD-10-CM | POA: Diagnosis not present

## 2019-12-06 DIAGNOSIS — Z79899 Other long term (current) drug therapy: Secondary | ICD-10-CM | POA: Diagnosis not present

## 2019-12-10 DIAGNOSIS — E785 Hyperlipidemia, unspecified: Secondary | ICD-10-CM | POA: Diagnosis not present

## 2019-12-10 DIAGNOSIS — S72091A Other fracture of head and neck of right femur, initial encounter for closed fracture: Secondary | ICD-10-CM | POA: Diagnosis not present

## 2019-12-10 DIAGNOSIS — I10 Essential (primary) hypertension: Secondary | ICD-10-CM | POA: Diagnosis not present

## 2019-12-16 DIAGNOSIS — D649 Anemia, unspecified: Secondary | ICD-10-CM | POA: Diagnosis not present

## 2019-12-16 DIAGNOSIS — E118 Type 2 diabetes mellitus with unspecified complications: Secondary | ICD-10-CM | POA: Diagnosis not present

## 2019-12-17 DIAGNOSIS — Z03818 Encounter for observation for suspected exposure to other biological agents ruled out: Secondary | ICD-10-CM | POA: Diagnosis not present

## 2019-12-23 DIAGNOSIS — R5381 Other malaise: Secondary | ICD-10-CM | POA: Diagnosis not present

## 2019-12-23 DIAGNOSIS — S72091A Other fracture of head and neck of right femur, initial encounter for closed fracture: Secondary | ICD-10-CM | POA: Diagnosis not present

## 2019-12-23 DIAGNOSIS — E785 Hyperlipidemia, unspecified: Secondary | ICD-10-CM | POA: Diagnosis not present

## 2019-12-23 DIAGNOSIS — I10 Essential (primary) hypertension: Secondary | ICD-10-CM | POA: Diagnosis not present

## 2019-12-24 DIAGNOSIS — M6259 Muscle wasting and atrophy, not elsewhere classified, multiple sites: Secondary | ICD-10-CM | POA: Diagnosis not present

## 2019-12-24 DIAGNOSIS — M4802 Spinal stenosis, cervical region: Secondary | ICD-10-CM | POA: Diagnosis not present

## 2019-12-24 DIAGNOSIS — I6330 Cerebral infarction due to thrombosis of unspecified cerebral artery: Secondary | ICD-10-CM | POA: Diagnosis not present

## 2019-12-26 DIAGNOSIS — M4802 Spinal stenosis, cervical region: Secondary | ICD-10-CM | POA: Diagnosis not present

## 2019-12-26 DIAGNOSIS — M6259 Muscle wasting and atrophy, not elsewhere classified, multiple sites: Secondary | ICD-10-CM | POA: Diagnosis not present

## 2019-12-26 DIAGNOSIS — I6330 Cerebral infarction due to thrombosis of unspecified cerebral artery: Secondary | ICD-10-CM | POA: Diagnosis not present

## 2019-12-27 DIAGNOSIS — I6330 Cerebral infarction due to thrombosis of unspecified cerebral artery: Secondary | ICD-10-CM | POA: Diagnosis not present

## 2019-12-27 DIAGNOSIS — M6259 Muscle wasting and atrophy, not elsewhere classified, multiple sites: Secondary | ICD-10-CM | POA: Diagnosis not present

## 2019-12-27 DIAGNOSIS — M4802 Spinal stenosis, cervical region: Secondary | ICD-10-CM | POA: Diagnosis not present

## 2019-12-30 DIAGNOSIS — R54 Age-related physical debility: Secondary | ICD-10-CM | POA: Diagnosis not present

## 2019-12-30 DIAGNOSIS — S72091A Other fracture of head and neck of right femur, initial encounter for closed fracture: Secondary | ICD-10-CM | POA: Diagnosis not present

## 2019-12-30 DIAGNOSIS — I6330 Cerebral infarction due to thrombosis of unspecified cerebral artery: Secondary | ICD-10-CM | POA: Diagnosis not present

## 2019-12-30 DIAGNOSIS — I2510 Atherosclerotic heart disease of native coronary artery without angina pectoris: Secondary | ICD-10-CM | POA: Diagnosis not present

## 2019-12-30 DIAGNOSIS — M4802 Spinal stenosis, cervical region: Secondary | ICD-10-CM | POA: Diagnosis not present

## 2019-12-30 DIAGNOSIS — I10 Essential (primary) hypertension: Secondary | ICD-10-CM | POA: Diagnosis not present

## 2019-12-30 DIAGNOSIS — M6259 Muscle wasting and atrophy, not elsewhere classified, multiple sites: Secondary | ICD-10-CM | POA: Diagnosis not present

## 2019-12-31 DIAGNOSIS — M4802 Spinal stenosis, cervical region: Secondary | ICD-10-CM | POA: Diagnosis not present

## 2019-12-31 DIAGNOSIS — M6259 Muscle wasting and atrophy, not elsewhere classified, multiple sites: Secondary | ICD-10-CM | POA: Diagnosis not present

## 2019-12-31 DIAGNOSIS — I6330 Cerebral infarction due to thrombosis of unspecified cerebral artery: Secondary | ICD-10-CM | POA: Diagnosis not present

## 2020-01-01 DIAGNOSIS — I6330 Cerebral infarction due to thrombosis of unspecified cerebral artery: Secondary | ICD-10-CM | POA: Diagnosis not present

## 2020-01-01 DIAGNOSIS — M4802 Spinal stenosis, cervical region: Secondary | ICD-10-CM | POA: Diagnosis not present

## 2020-01-01 DIAGNOSIS — M6259 Muscle wasting and atrophy, not elsewhere classified, multiple sites: Secondary | ICD-10-CM | POA: Diagnosis not present

## 2020-01-02 ENCOUNTER — Other Ambulatory Visit: Payer: Self-pay

## 2020-01-02 DIAGNOSIS — M6259 Muscle wasting and atrophy, not elsewhere classified, multiple sites: Secondary | ICD-10-CM | POA: Diagnosis not present

## 2020-01-02 DIAGNOSIS — M4802 Spinal stenosis, cervical region: Secondary | ICD-10-CM | POA: Diagnosis not present

## 2020-01-02 DIAGNOSIS — I6330 Cerebral infarction due to thrombosis of unspecified cerebral artery: Secondary | ICD-10-CM | POA: Diagnosis not present

## 2020-01-02 NOTE — Patient Outreach (Signed)
[facility]) Care Management  01/02/2020  Heidee Audi 03-Oct-1956 [id number]   Medication Adherence call to Mrs. Tymeshia Awan HIPPA Compliant Voice message left with a call back number. Mrs. Joslyn is showing past due on Atorvastatin 40 mg under [facility] Ins.   Lillia Abed CPhT Pharmacy Technician [facility] Care Management Direct Dial [phone]  Fax [phone] [email or web address]

## 2020-01-03 DIAGNOSIS — M6259 Muscle wasting and atrophy, not elsewhere classified, multiple sites: Secondary | ICD-10-CM | POA: Diagnosis not present

## 2020-01-03 DIAGNOSIS — I6330 Cerebral infarction due to thrombosis of unspecified cerebral artery: Secondary | ICD-10-CM | POA: Diagnosis not present

## 2020-01-03 DIAGNOSIS — M4802 Spinal stenosis, cervical region: Secondary | ICD-10-CM | POA: Diagnosis not present

## 2020-01-06 DIAGNOSIS — M4802 Spinal stenosis, cervical region: Secondary | ICD-10-CM | POA: Diagnosis not present

## 2020-01-06 DIAGNOSIS — I6330 Cerebral infarction due to thrombosis of unspecified cerebral artery: Secondary | ICD-10-CM | POA: Diagnosis not present

## 2020-01-06 DIAGNOSIS — M6259 Muscle wasting and atrophy, not elsewhere classified, multiple sites: Secondary | ICD-10-CM | POA: Diagnosis not present

## 2020-01-08 DIAGNOSIS — M4802 Spinal stenosis, cervical region: Secondary | ICD-10-CM | POA: Diagnosis not present

## 2020-01-08 DIAGNOSIS — M6259 Muscle wasting and atrophy, not elsewhere classified, multiple sites: Secondary | ICD-10-CM | POA: Diagnosis not present

## 2020-01-08 DIAGNOSIS — I6330 Cerebral infarction due to thrombosis of unspecified cerebral artery: Secondary | ICD-10-CM | POA: Diagnosis not present

## 2020-01-10 DIAGNOSIS — Z79899 Other long term (current) drug therapy: Secondary | ICD-10-CM | POA: Diagnosis not present

## 2020-01-10 DIAGNOSIS — M4802 Spinal stenosis, cervical region: Secondary | ICD-10-CM | POA: Diagnosis not present

## 2020-01-10 DIAGNOSIS — M6259 Muscle wasting and atrophy, not elsewhere classified, multiple sites: Secondary | ICD-10-CM | POA: Diagnosis not present

## 2020-01-10 DIAGNOSIS — I6330 Cerebral infarction due to thrombosis of unspecified cerebral artery: Secondary | ICD-10-CM | POA: Diagnosis not present

## 2020-01-13 DIAGNOSIS — I6330 Cerebral infarction due to thrombosis of unspecified cerebral artery: Secondary | ICD-10-CM | POA: Diagnosis not present

## 2020-01-13 DIAGNOSIS — M4802 Spinal stenosis, cervical region: Secondary | ICD-10-CM | POA: Diagnosis not present

## 2020-01-13 DIAGNOSIS — M6259 Muscle wasting and atrophy, not elsewhere classified, multiple sites: Secondary | ICD-10-CM | POA: Diagnosis not present

## 2020-01-15 DIAGNOSIS — Z4789 Encounter for other orthopedic aftercare: Secondary | ICD-10-CM | POA: Diagnosis not present

## 2020-01-16 DIAGNOSIS — M4802 Spinal stenosis, cervical region: Secondary | ICD-10-CM | POA: Diagnosis not present

## 2020-01-16 DIAGNOSIS — M6259 Muscle wasting and atrophy, not elsewhere classified, multiple sites: Secondary | ICD-10-CM | POA: Diagnosis not present

## 2020-01-16 DIAGNOSIS — I6330 Cerebral infarction due to thrombosis of unspecified cerebral artery: Secondary | ICD-10-CM | POA: Diagnosis not present

## 2020-01-17 DIAGNOSIS — I6330 Cerebral infarction due to thrombosis of unspecified cerebral artery: Secondary | ICD-10-CM | POA: Diagnosis not present

## 2020-01-17 DIAGNOSIS — M6259 Muscle wasting and atrophy, not elsewhere classified, multiple sites: Secondary | ICD-10-CM | POA: Diagnosis not present

## 2020-01-17 DIAGNOSIS — M4802 Spinal stenosis, cervical region: Secondary | ICD-10-CM | POA: Diagnosis not present

## 2020-01-21 DIAGNOSIS — I6330 Cerebral infarction due to thrombosis of unspecified cerebral artery: Secondary | ICD-10-CM | POA: Diagnosis not present

## 2020-01-21 DIAGNOSIS — I2510 Atherosclerotic heart disease of native coronary artery without angina pectoris: Secondary | ICD-10-CM | POA: Diagnosis not present

## 2020-01-21 DIAGNOSIS — K219 Gastro-esophageal reflux disease without esophagitis: Secondary | ICD-10-CM | POA: Diagnosis not present

## 2020-01-21 DIAGNOSIS — M6259 Muscle wasting and atrophy, not elsewhere classified, multiple sites: Secondary | ICD-10-CM | POA: Diagnosis not present

## 2020-01-21 DIAGNOSIS — R52 Pain, unspecified: Secondary | ICD-10-CM | POA: Diagnosis not present

## 2020-01-21 DIAGNOSIS — M4802 Spinal stenosis, cervical region: Secondary | ICD-10-CM | POA: Diagnosis not present

## 2020-01-22 DIAGNOSIS — I6330 Cerebral infarction due to thrombosis of unspecified cerebral artery: Secondary | ICD-10-CM | POA: Diagnosis not present

## 2020-01-22 DIAGNOSIS — M6259 Muscle wasting and atrophy, not elsewhere classified, multiple sites: Secondary | ICD-10-CM | POA: Diagnosis not present

## 2020-01-22 DIAGNOSIS — M4802 Spinal stenosis, cervical region: Secondary | ICD-10-CM | POA: Diagnosis not present

## 2020-01-23 DIAGNOSIS — I6330 Cerebral infarction due to thrombosis of unspecified cerebral artery: Secondary | ICD-10-CM | POA: Diagnosis not present

## 2020-01-23 DIAGNOSIS — I69315 Cognitive social or emotional deficit following cerebral infarction: Secondary | ICD-10-CM | POA: Diagnosis not present

## 2020-01-24 DIAGNOSIS — I69315 Cognitive social or emotional deficit following cerebral infarction: Secondary | ICD-10-CM | POA: Diagnosis not present

## 2020-01-24 DIAGNOSIS — I6330 Cerebral infarction due to thrombosis of unspecified cerebral artery: Secondary | ICD-10-CM | POA: Diagnosis not present

## 2020-01-27 DIAGNOSIS — M6259 Muscle wasting and atrophy, not elsewhere classified, multiple sites: Secondary | ICD-10-CM | POA: Diagnosis not present

## 2020-01-27 DIAGNOSIS — I6330 Cerebral infarction due to thrombosis of unspecified cerebral artery: Secondary | ICD-10-CM | POA: Diagnosis not present

## 2020-01-27 DIAGNOSIS — M4802 Spinal stenosis, cervical region: Secondary | ICD-10-CM | POA: Diagnosis not present

## 2020-01-27 DIAGNOSIS — I69315 Cognitive social or emotional deficit following cerebral infarction: Secondary | ICD-10-CM | POA: Diagnosis not present

## 2020-01-28 DIAGNOSIS — I6330 Cerebral infarction due to thrombosis of unspecified cerebral artery: Secondary | ICD-10-CM | POA: Diagnosis not present

## 2020-01-28 DIAGNOSIS — I69315 Cognitive social or emotional deficit following cerebral infarction: Secondary | ICD-10-CM | POA: Diagnosis not present

## 2020-01-28 DIAGNOSIS — M6259 Muscle wasting and atrophy, not elsewhere classified, multiple sites: Secondary | ICD-10-CM | POA: Diagnosis not present

## 2020-01-28 DIAGNOSIS — M4802 Spinal stenosis, cervical region: Secondary | ICD-10-CM | POA: Diagnosis not present

## 2020-01-29 DIAGNOSIS — I69315 Cognitive social or emotional deficit following cerebral infarction: Secondary | ICD-10-CM | POA: Diagnosis not present

## 2020-01-29 DIAGNOSIS — I6330 Cerebral infarction due to thrombosis of unspecified cerebral artery: Secondary | ICD-10-CM | POA: Diagnosis not present

## 2020-01-30 DIAGNOSIS — M4802 Spinal stenosis, cervical region: Secondary | ICD-10-CM | POA: Diagnosis not present

## 2020-01-30 DIAGNOSIS — I6330 Cerebral infarction due to thrombosis of unspecified cerebral artery: Secondary | ICD-10-CM | POA: Diagnosis not present

## 2020-01-30 DIAGNOSIS — I69315 Cognitive social or emotional deficit following cerebral infarction: Secondary | ICD-10-CM | POA: Diagnosis not present

## 2020-01-30 DIAGNOSIS — M6259 Muscle wasting and atrophy, not elsewhere classified, multiple sites: Secondary | ICD-10-CM | POA: Diagnosis not present

## 2020-01-31 DIAGNOSIS — I69315 Cognitive social or emotional deficit following cerebral infarction: Secondary | ICD-10-CM | POA: Diagnosis not present

## 2020-01-31 DIAGNOSIS — I6330 Cerebral infarction due to thrombosis of unspecified cerebral artery: Secondary | ICD-10-CM | POA: Diagnosis not present

## 2020-02-03 DIAGNOSIS — I6330 Cerebral infarction due to thrombosis of unspecified cerebral artery: Secondary | ICD-10-CM | POA: Diagnosis not present

## 2020-02-03 DIAGNOSIS — I69315 Cognitive social or emotional deficit following cerebral infarction: Secondary | ICD-10-CM | POA: Diagnosis not present

## 2020-02-04 DIAGNOSIS — M6259 Muscle wasting and atrophy, not elsewhere classified, multiple sites: Secondary | ICD-10-CM | POA: Diagnosis not present

## 2020-02-04 DIAGNOSIS — M4802 Spinal stenosis, cervical region: Secondary | ICD-10-CM | POA: Diagnosis not present

## 2020-02-04 DIAGNOSIS — I6330 Cerebral infarction due to thrombosis of unspecified cerebral artery: Secondary | ICD-10-CM | POA: Diagnosis not present

## 2020-02-04 DIAGNOSIS — I69315 Cognitive social or emotional deficit following cerebral infarction: Secondary | ICD-10-CM | POA: Diagnosis not present

## 2020-02-05 DIAGNOSIS — I69315 Cognitive social or emotional deficit following cerebral infarction: Secondary | ICD-10-CM | POA: Diagnosis not present

## 2020-02-05 DIAGNOSIS — I6330 Cerebral infarction due to thrombosis of unspecified cerebral artery: Secondary | ICD-10-CM | POA: Diagnosis not present

## 2020-02-06 DIAGNOSIS — I69315 Cognitive social or emotional deficit following cerebral infarction: Secondary | ICD-10-CM | POA: Diagnosis not present

## 2020-02-06 DIAGNOSIS — I6330 Cerebral infarction due to thrombosis of unspecified cerebral artery: Secondary | ICD-10-CM | POA: Diagnosis not present

## 2020-02-07 DIAGNOSIS — I6330 Cerebral infarction due to thrombosis of unspecified cerebral artery: Secondary | ICD-10-CM | POA: Diagnosis not present

## 2020-02-07 DIAGNOSIS — I69315 Cognitive social or emotional deficit following cerebral infarction: Secondary | ICD-10-CM | POA: Diagnosis not present

## 2020-02-09 DIAGNOSIS — I69315 Cognitive social or emotional deficit following cerebral infarction: Secondary | ICD-10-CM | POA: Diagnosis not present

## 2020-02-09 DIAGNOSIS — I6330 Cerebral infarction due to thrombosis of unspecified cerebral artery: Secondary | ICD-10-CM | POA: Diagnosis not present

## 2020-02-10 DIAGNOSIS — I6330 Cerebral infarction due to thrombosis of unspecified cerebral artery: Secondary | ICD-10-CM | POA: Diagnosis not present

## 2020-02-10 DIAGNOSIS — I69315 Cognitive social or emotional deficit following cerebral infarction: Secondary | ICD-10-CM | POA: Diagnosis not present

## 2020-02-11 DIAGNOSIS — I69315 Cognitive social or emotional deficit following cerebral infarction: Secondary | ICD-10-CM | POA: Diagnosis not present

## 2020-02-11 DIAGNOSIS — I6330 Cerebral infarction due to thrombosis of unspecified cerebral artery: Secondary | ICD-10-CM | POA: Diagnosis not present

## 2020-02-13 DIAGNOSIS — I6330 Cerebral infarction due to thrombosis of unspecified cerebral artery: Secondary | ICD-10-CM | POA: Diagnosis not present

## 2020-02-13 DIAGNOSIS — I69315 Cognitive social or emotional deficit following cerebral infarction: Secondary | ICD-10-CM | POA: Diagnosis not present

## 2020-02-14 DIAGNOSIS — I6330 Cerebral infarction due to thrombosis of unspecified cerebral artery: Secondary | ICD-10-CM | POA: Diagnosis not present

## 2020-02-14 DIAGNOSIS — I69315 Cognitive social or emotional deficit following cerebral infarction: Secondary | ICD-10-CM | POA: Diagnosis not present

## 2020-02-17 DIAGNOSIS — M19032 Primary osteoarthritis, left wrist: Secondary | ICD-10-CM | POA: Diagnosis not present

## 2020-02-17 DIAGNOSIS — M79602 Pain in left arm: Secondary | ICD-10-CM | POA: Diagnosis not present

## 2020-02-21 DIAGNOSIS — M79602 Pain in left arm: Secondary | ICD-10-CM | POA: Diagnosis not present

## 2020-03-16 DIAGNOSIS — M19012 Primary osteoarthritis, left shoulder: Secondary | ICD-10-CM | POA: Diagnosis not present

## 2020-03-20 DIAGNOSIS — I10 Essential (primary) hypertension: Secondary | ICD-10-CM | POA: Diagnosis not present

## 2020-03-20 DIAGNOSIS — E119 Type 2 diabetes mellitus without complications: Secondary | ICD-10-CM | POA: Diagnosis not present

## 2020-03-20 DIAGNOSIS — I2510 Atherosclerotic heart disease of native coronary artery without angina pectoris: Secondary | ICD-10-CM | POA: Diagnosis not present

## 2020-03-20 DIAGNOSIS — E785 Hyperlipidemia, unspecified: Secondary | ICD-10-CM | POA: Diagnosis not present

## 2020-04-10 ENCOUNTER — Emergency Department (HOSPITAL_COMMUNITY): Payer: Medicare Other

## 2020-04-10 ENCOUNTER — Emergency Department (HOSPITAL_COMMUNITY)
Admission: EM | Admit: 2020-04-10 | Discharge: 2020-04-10 | Disposition: A | Discharge status: Home / Self Care | Payer: Medicare Other | Attending: Emergency Medicine | Admitting: Emergency Medicine

## 2020-04-10 DIAGNOSIS — M79602 Pain in left arm: Secondary | ICD-10-CM | POA: Insufficient documentation

## 2020-04-10 DIAGNOSIS — Z79899 Other long term (current) drug therapy: Secondary | ICD-10-CM | POA: Diagnosis not present

## 2020-04-10 DIAGNOSIS — M79622 Pain in left upper arm: Secondary | ICD-10-CM | POA: Diagnosis not present

## 2020-04-10 DIAGNOSIS — Z7901 Long term (current) use of anticoagulants: Secondary | ICD-10-CM | POA: Diagnosis not present

## 2020-04-10 DIAGNOSIS — Z794 Long term (current) use of insulin: Secondary | ICD-10-CM | POA: Insufficient documentation

## 2020-04-10 DIAGNOSIS — M79642 Pain in left hand: Secondary | ICD-10-CM | POA: Diagnosis not present

## 2020-04-10 DIAGNOSIS — Z7401 Bed confinement status: Secondary | ICD-10-CM | POA: Diagnosis not present

## 2020-04-10 DIAGNOSIS — Z743 Need for continuous supervision: Secondary | ICD-10-CM | POA: Diagnosis not present

## 2020-04-10 DIAGNOSIS — E119 Type 2 diabetes mellitus without complications: Secondary | ICD-10-CM | POA: Insufficient documentation

## 2020-04-10 DIAGNOSIS — M25539 Pain in unspecified wrist: Secondary | ICD-10-CM | POA: Diagnosis not present

## 2020-04-10 DIAGNOSIS — I10 Essential (primary) hypertension: Secondary | ICD-10-CM | POA: Insufficient documentation

## 2020-04-10 DIAGNOSIS — Z7982 Long term (current) use of aspirin: Secondary | ICD-10-CM | POA: Diagnosis not present

## 2020-04-10 DIAGNOSIS — R6889 Other general symptoms and signs: Secondary | ICD-10-CM | POA: Diagnosis not present

## 2020-04-10 DIAGNOSIS — M79632 Pain in left forearm: Secondary | ICD-10-CM | POA: Diagnosis not present

## 2020-04-10 DIAGNOSIS — E1165 Type 2 diabetes mellitus with hyperglycemia: Secondary | ICD-10-CM | POA: Diagnosis not present

## 2020-04-10 DIAGNOSIS — M79603 Pain in arm, unspecified: Secondary | ICD-10-CM | POA: Diagnosis not present

## 2020-04-10 DIAGNOSIS — M2550 Pain in unspecified joint: Secondary | ICD-10-CM | POA: Diagnosis not present

## 2020-04-10 DIAGNOSIS — M25519 Pain in unspecified shoulder: Secondary | ICD-10-CM | POA: Diagnosis not present

## 2020-04-10 DIAGNOSIS — G459 Transient cerebral ischemic attack, unspecified: Secondary | ICD-10-CM | POA: Diagnosis not present

## 2020-04-10 NOTE — ED Notes (Signed)
[facility] called to transport pt 

## 2020-04-10 NOTE — Discharge Instructions (Addendum)
Splint placed on arm for comfort. Splint can be removed for bathing. Follow-up with primary care provider if pain persists.

## 2020-04-10 NOTE — Progress Notes (Signed)
Orthopedic Tech Progress Note Patient Details:  Omega Durante Feb 20, 1956 [id number]  Ortho Devices Type of Ortho Device: Thumb velcro splint Ortho Device/Splint Location: LUE Ortho Device/Splint Interventions: Application   Post Interventions Patient Tolerated: Well Instructions Provided: Adjustment of device   Jasir Rother E Velita Quirk 04/10/2020, 9:05 PM

## 2020-04-10 NOTE — ED Provider Notes (Signed)
[facility] EMERGENCY DEPARTMENT Provider Note   CSN: [id number] Arrival date & time: 04/10/20  1735     History Chief Complaint  Patient presents with  . Arm Pain    Lauren Waller is a 64 y.o. female.  64 year old female brought in by EMS from nursing facility for left arm pain. Patient denies falls or injuries, points to her hand/wrist as to location of greatest pain but also pain in left bicep/mid upper arm area. Denies problems with this arm previous. Denies chest pain, SHOB, abdominal pain.         Past Medical History:  Diagnosis Date  . Chronic lower back pain   . Depression   . Facial cellulitis 04/17/2018  . HLD (hyperlipidemia)   . Hypertension   . Type II diabetes mellitus [facility])     Patient Active Problem List   Diagnosis Date Noted  . Intertrochanteric fracture of right femur (HCC) 10/17/2019  . History of CVA with residual deficit 10/17/2019  . Hypoalbuminemia 10/17/2019  . Normocytic anemia 10/17/2019  . Fall 10/17/2019  . 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 HLD (hyperlipidemia) 04/17/2018  . Hypertensive urgency 04/17/2018  . Facial cellulitis 04/17/2018  . Sepsis (HCC) 04/17/2018  . Diabetes mellitus without complication (HCC)   . Hypertension   . Varicose veins of bilateral lower extremities with other complications 08/29/2017    Past Surgical History:  Procedure Laterality Date  . BUBBLE STUDY  08/06/2019   Procedure: BUBBLE STUDY;  Surgeon: Chrystie Nose, MD;  Location: [facility] ENDOSCOPY;  Service: Cardiovascular;;  . CATARACT EXTRACTION W/ INTRAOCULAR LENS  IMPLANT, BILATERAL Bilateral   . EYE SURGERY Bilateral    2016  . fatty tissue removal from upper back     1998  . INGUINAL HERNIA REPAIR Left   . INTRAMEDULLARY (IM) NAIL INTERTROCHANTERIC Right 10/17/2019   Procedure: INTRAMEDULLARY (IM) NAIL INTERTROCHANTRIC;  Surgeon: Yolonda Kida, MD;  Location: [facility] OR;   Service: Orthopedics;  Laterality: Right;  . MYOMECTOMY    . ROBOTIC ASSISTED LAPAROSCOPIC VAGINAL HYSTERECTOMY WITH FIBROID REMOVAL     2009  . TEE WITHOUT CARDIOVERSION N/A 08/06/2019   Procedure: TRANSESOPHAGEAL ECHOCARDIOGRAM (TEE);  Surgeon: Chrystie Nose, MD;  Location: [facility] ENDOSCOPY;  Service: Cardiovascular;  Laterality: N/A;     OB History   No obstetric history on file.     Family History  Problem Relation Age of Onset  . Hypertension Mother   . Pancreatic cancer Brother   . Stroke Neg Hx     Social History   Tobacco Use  . Smoking status: Never Smoker  . Smokeless tobacco: Never Used  Vaping Use  . Vaping Use: Never used  Substance Use Topics  . Alcohol use: Not Currently  . Drug use: Never    Home Medications Prior to Admission medications   Medication Sig Start Date End Date Taking? Authorizing Provider  amLODipine (NORVASC) 10 MG tablet Take 10 mg by mouth daily. 03/23/18   [provider]  aspirin EC 81 MG EC tablet Take 1 tablet (81 mg total) by mouth daily. 08/11/19   Berton Mount I, MD  atenolol (TENORMIN) 100 MG tablet Take 1 tablet by mouth daily 12/20/18   Patwardhan, Anabel Bene, MD  atorvastatin (LIPITOR) 40 MG tablet Take 1 tablet (40 mg total) by mouth daily. 08/11/19   Barnetta Chapel, MD  B Complex-C (B-COMPLEX WITH VITAMIN C) tablet Take 1 tablet by mouth daily. 08/11/19  Dana Allan I, MD  cloNIDine (CATAPRES - DOSED IN MG/24 HR) 0.2 mg/24hr patch Place 0.2 mg onto the skin once a week. Saturday    [provider]  clopidogrel (PLAVIX) 75 MG tablet Take 1 tablet (75 mg total) by mouth daily. 08/11/19   Bonnell Public, MD  enoxaparin (LOVENOX) 40 MG/0.4ML injection Inject 0.4 mLs (40 mg total) into the skin daily for 21 days. 10/24/19 11/14/19  Dana Allan I, MD  ferrous sulfate 325 (65 FE) MG tablet Take 1 tablet (325 mg total) by mouth 2 (two) times daily with a meal. 10/24/19   Bonnell Public, MD    Glucerna (GLUCERNA) LIQD Take 237 mLs by mouth 2 (two) times daily between meals.    [provider]  hydrALAZINE (APRESOLINE) 100 MG tablet Take 1 tablet (100 mg total) by mouth every 8 (eight) hours. 08/10/19   Bonnell Public, MD  HYDROcodone-acetaminophen (NORCO/VICODIN) 5-325 MG tablet Take 1 tablet by mouth every 6 (six) hours as needed for moderate pain. 10/24/19   Dana Allan I, MD  insulin aspart (NOVOLOG) 100 UNIT/ML injection Inject 0-15 Units into the skin 3 (three) times daily with meals. 10/24/19   Bonnell Public, MD  megestrol (MEGACE) 400 MG/10ML suspension Take 10 mLs (400 mg total) by mouth 2 (two) times daily. 10/24/19   Bonnell Public, MD  Multiple Vitamin (MULTIVITAMIN WITH MINERALS) TABS tablet Take 1 tablet by mouth daily. 10/25/19   Dana Allan I, MD  pantoprazole (PROTONIX) 20 MG tablet Take 40 mg by mouth daily.    [provider]  polyethylene glycol (MIRALAX / GLYCOLAX) 17 g packet Take 17 g by mouth daily. 10/25/19   Bonnell Public, MD  senna-docusate (SENOKOT-S) 8.6-50 MG tablet Take 2 tablets by mouth 2 (two) times daily. 10/24/19   Bonnell Public, MD    Allergies    Januvia [sitagliptin] and Metformin and related  Review of Systems   Review of Systems  Constitutional: Negative for fever.  Respiratory: Negative for shortness of breath.   Cardiovascular: Negative for chest pain.  Gastrointestinal: Negative for abdominal pain.  Musculoskeletal: Positive for arthralgias and myalgias.  Skin: Negative for color change, rash and wound.  Neurological: Negative for weakness and numbness.    Physical Exam Updated Vital Signs BP (!) 146/82 (BP Location: Right Arm)   Pulse 92   Temp 99.3 F (37.4 C) (Oral)   Resp 16   SpO2 100%   Physical Exam Vitals and nursing note reviewed.  Constitutional:      General: She is not in acute distress.    Appearance: She is well-developed. She is not diaphoretic.  HENT:      Head: Normocephalic and atraumatic.  Cardiovascular:     Rate and Rhythm: Normal rate and regular rhythm.     Pulses: Normal pulses.     Heart sounds: Normal heart sounds.  Pulmonary:     Effort: Pulmonary effort is normal.     Breath sounds: Normal breath sounds.  Abdominal:     Palpations: Abdomen is soft.     Tenderness: There is no abdominal tenderness.  Musculoskeletal:        General: Tenderness present. No swelling or deformity.  Skin:    General: Skin is warm and dry.     Findings: No erythema or rash.  Neurological:     Mental Status: She is alert and oriented to person, place, and time.  Psychiatric:  Behavior: Behavior normal.     ED Results / Procedures / Treatments   Labs (all labs ordered are listed, but only abnormal results are displayed) Labs Reviewed - No data to display  EKG None  Radiology DG Forearm Left  Result Date: 04/10/2020 CLINICAL DATA:  Pain EXAM: LEFT FOREARM - 2 VIEW COMPARISON:  None. FINDINGS: There is no evidence of fracture or other focal bone lesions. Soft tissues are unremarkable. IMPRESSION: Negative. Electronically Signed   By: Jonna Clark M.D.   On: 04/10/2020 19:39   DG Humerus Left  Result Date: 04/10/2020 CLINICAL DATA:  Pain EXAM: LEFT HUMERUS - 2+ VIEW COMPARISON:  None. FINDINGS: There is no evidence of fracture or other focal bone lesions. Soft tissues are unremarkable. IMPRESSION: Negative. Electronically Signed   By: Jonna Clark M.D.   On: 04/10/2020 19:37   DG Hand Complete Left  Result Date: 04/10/2020 CLINICAL DATA:  Pain EXAM: LEFT HAND - COMPLETE 3+ VIEW COMPARISON:  None. FINDINGS: There is a subtle lucency seen at the ulnar base of the distal first phalanx which could represent a age indeterminate nondisplaced fracture. There is diffuse osteopenia. 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 IMPRESSION: Question of a nondisplaced  fracture at the ulnar base of the first distal phalanx. Electronically Signed   By: Jonna Clark M.D.   On: 04/10/2020 19:38    Procedures Procedures (including critical care time)  Medications Ordered in ED Medications - No data to display  ED Course  I have reviewed the triage vital signs and the nursing notes.  Pertinent labs & imaging results that were available during my care of the patient were reviewed by me and considered in my medical decision making (see chart for details).  Clinical Course as of Apr 10 2053  Fri Apr 10, 2020  [id number] 64 year old female from [facility] with left arm pain.  On exam has tenderness primarily over her left hand and wrist with vague tenderness to the mid left upper arm.  Patient denies chest pain, shortness of breath, abdominal pain.  There is no swelling or redness, no bruise to the arm. X-ray left upper arm unremarkable, left forearm unremarkable, left hand with possible abnormality to the distal left thumb, no specific tenderness in this area.  Patient will be placed in a splint for comfort.  Informed she can remove this for bathing and as needed.  Recommend follow-up with PCP if her pain persist.   [LM]    Clinical Course User Index [LM] Alden Hipp   MDM Rules/Calculators/A&P                         Final Clinical Impression(s) / ED Diagnoses Final diagnoses:  Left arm pain    Rx / DC Orders ED Discharge Orders    None       Jeannie Fend, PA-C 04/10/20 2054    Pricilla Loveless, MD 04/11/20 1506

## 2020-04-10 NOTE — ED Triage Notes (Signed)
Patient cvomes in via [facility] from [facility] for left arm pain. EMS reports that its mainly in her left wrist and left shoulder. EMS also reports she may have some history of minor cognitive impairment.  CBG 273 130/78 92 HR  16 RR 96% RA

## 2020-04-13 DIAGNOSIS — R54 Age-related physical debility: Secondary | ICD-10-CM | POA: Diagnosis not present

## 2020-04-13 DIAGNOSIS — R52 Pain, unspecified: Secondary | ICD-10-CM | POA: Diagnosis not present

## 2020-04-13 DIAGNOSIS — R5381 Other malaise: Secondary | ICD-10-CM | POA: Diagnosis not present

## 2020-04-13 DIAGNOSIS — M79602 Pain in left arm: Secondary | ICD-10-CM | POA: Diagnosis not present

## 2020-04-21 DIAGNOSIS — M4802 Spinal stenosis, cervical region: Secondary | ICD-10-CM | POA: Diagnosis not present

## 2020-04-21 DIAGNOSIS — E11649 Type 2 diabetes mellitus with hypoglycemia without coma: Secondary | ICD-10-CM | POA: Diagnosis not present

## 2020-04-21 DIAGNOSIS — M6259 Muscle wasting and atrophy, not elsewhere classified, multiple sites: Secondary | ICD-10-CM | POA: Diagnosis not present

## 2020-04-21 DIAGNOSIS — I6330 Cerebral infarction due to thrombosis of unspecified cerebral artery: Secondary | ICD-10-CM | POA: Diagnosis not present

## 2020-04-22 DIAGNOSIS — E11649 Type 2 diabetes mellitus with hypoglycemia without coma: Secondary | ICD-10-CM | POA: Diagnosis not present

## 2020-04-22 DIAGNOSIS — I6330 Cerebral infarction due to thrombosis of unspecified cerebral artery: Secondary | ICD-10-CM | POA: Diagnosis not present

## 2020-04-22 DIAGNOSIS — M6259 Muscle wasting and atrophy, not elsewhere classified, multiple sites: Secondary | ICD-10-CM | POA: Diagnosis not present

## 2020-04-22 DIAGNOSIS — M4802 Spinal stenosis, cervical region: Secondary | ICD-10-CM | POA: Diagnosis not present

## 2020-04-23 DIAGNOSIS — I6330 Cerebral infarction due to thrombosis of unspecified cerebral artery: Secondary | ICD-10-CM | POA: Diagnosis not present

## 2020-04-23 DIAGNOSIS — E11649 Type 2 diabetes mellitus with hypoglycemia without coma: Secondary | ICD-10-CM | POA: Diagnosis not present

## 2020-04-23 DIAGNOSIS — M6259 Muscle wasting and atrophy, not elsewhere classified, multiple sites: Secondary | ICD-10-CM | POA: Diagnosis not present

## 2020-04-23 DIAGNOSIS — M4802 Spinal stenosis, cervical region: Secondary | ICD-10-CM | POA: Diagnosis not present

## 2020-04-24 DIAGNOSIS — M6259 Muscle wasting and atrophy, not elsewhere classified, multiple sites: Secondary | ICD-10-CM | POA: Diagnosis not present

## 2020-04-24 DIAGNOSIS — I6330 Cerebral infarction due to thrombosis of unspecified cerebral artery: Secondary | ICD-10-CM | POA: Diagnosis not present

## 2020-04-24 DIAGNOSIS — E11649 Type 2 diabetes mellitus with hypoglycemia without coma: Secondary | ICD-10-CM | POA: Diagnosis not present

## 2020-04-24 DIAGNOSIS — M4802 Spinal stenosis, cervical region: Secondary | ICD-10-CM | POA: Diagnosis not present

## 2020-04-27 DIAGNOSIS — E11649 Type 2 diabetes mellitus with hypoglycemia without coma: Secondary | ICD-10-CM | POA: Diagnosis not present

## 2020-04-27 DIAGNOSIS — I6330 Cerebral infarction due to thrombosis of unspecified cerebral artery: Secondary | ICD-10-CM | POA: Diagnosis not present

## 2020-04-27 DIAGNOSIS — M6259 Muscle wasting and atrophy, not elsewhere classified, multiple sites: Secondary | ICD-10-CM | POA: Diagnosis not present

## 2020-04-27 DIAGNOSIS — M4802 Spinal stenosis, cervical region: Secondary | ICD-10-CM | POA: Diagnosis not present

## 2020-04-28 DIAGNOSIS — E11649 Type 2 diabetes mellitus with hypoglycemia without coma: Secondary | ICD-10-CM | POA: Diagnosis not present

## 2020-04-28 DIAGNOSIS — M6259 Muscle wasting and atrophy, not elsewhere classified, multiple sites: Secondary | ICD-10-CM | POA: Diagnosis not present

## 2020-04-28 DIAGNOSIS — M4802 Spinal stenosis, cervical region: Secondary | ICD-10-CM | POA: Diagnosis not present

## 2020-04-28 DIAGNOSIS — I6330 Cerebral infarction due to thrombosis of unspecified cerebral artery: Secondary | ICD-10-CM | POA: Diagnosis not present

## 2020-04-29 DIAGNOSIS — M6259 Muscle wasting and atrophy, not elsewhere classified, multiple sites: Secondary | ICD-10-CM | POA: Diagnosis not present

## 2020-04-29 DIAGNOSIS — M4802 Spinal stenosis, cervical region: Secondary | ICD-10-CM | POA: Diagnosis not present

## 2020-04-29 DIAGNOSIS — I6330 Cerebral infarction due to thrombosis of unspecified cerebral artery: Secondary | ICD-10-CM | POA: Diagnosis not present

## 2020-04-29 DIAGNOSIS — E11649 Type 2 diabetes mellitus with hypoglycemia without coma: Secondary | ICD-10-CM | POA: Diagnosis not present

## 2020-05-05 DIAGNOSIS — E11649 Type 2 diabetes mellitus with hypoglycemia without coma: Secondary | ICD-10-CM | POA: Diagnosis not present

## 2020-05-05 DIAGNOSIS — M4802 Spinal stenosis, cervical region: Secondary | ICD-10-CM | POA: Diagnosis not present

## 2020-05-05 DIAGNOSIS — M6259 Muscle wasting and atrophy, not elsewhere classified, multiple sites: Secondary | ICD-10-CM | POA: Diagnosis not present

## 2020-05-05 DIAGNOSIS — I6330 Cerebral infarction due to thrombosis of unspecified cerebral artery: Secondary | ICD-10-CM | POA: Diagnosis not present

## 2020-05-08 DIAGNOSIS — M4802 Spinal stenosis, cervical region: Secondary | ICD-10-CM | POA: Diagnosis not present

## 2020-05-08 DIAGNOSIS — E11649 Type 2 diabetes mellitus with hypoglycemia without coma: Secondary | ICD-10-CM | POA: Diagnosis not present

## 2020-05-08 DIAGNOSIS — I6330 Cerebral infarction due to thrombosis of unspecified cerebral artery: Secondary | ICD-10-CM | POA: Diagnosis not present

## 2020-05-08 DIAGNOSIS — M6259 Muscle wasting and atrophy, not elsewhere classified, multiple sites: Secondary | ICD-10-CM | POA: Diagnosis not present

## 2020-05-12 DIAGNOSIS — E11649 Type 2 diabetes mellitus with hypoglycemia without coma: Secondary | ICD-10-CM | POA: Diagnosis not present

## 2020-05-12 DIAGNOSIS — M6259 Muscle wasting and atrophy, not elsewhere classified, multiple sites: Secondary | ICD-10-CM | POA: Diagnosis not present

## 2020-05-12 DIAGNOSIS — M4802 Spinal stenosis, cervical region: Secondary | ICD-10-CM | POA: Diagnosis not present

## 2020-05-12 DIAGNOSIS — I6330 Cerebral infarction due to thrombosis of unspecified cerebral artery: Secondary | ICD-10-CM | POA: Diagnosis not present

## 2020-05-18 DIAGNOSIS — M6259 Muscle wasting and atrophy, not elsewhere classified, multiple sites: Secondary | ICD-10-CM | POA: Diagnosis not present

## 2020-05-18 DIAGNOSIS — E11649 Type 2 diabetes mellitus with hypoglycemia without coma: Secondary | ICD-10-CM | POA: Diagnosis not present

## 2020-05-18 DIAGNOSIS — I6330 Cerebral infarction due to thrombosis of unspecified cerebral artery: Secondary | ICD-10-CM | POA: Diagnosis not present

## 2020-05-18 DIAGNOSIS — M4802 Spinal stenosis, cervical region: Secondary | ICD-10-CM | POA: Diagnosis not present

## 2020-05-19 DIAGNOSIS — M4802 Spinal stenosis, cervical region: Secondary | ICD-10-CM | POA: Diagnosis not present

## 2020-05-19 DIAGNOSIS — E11649 Type 2 diabetes mellitus with hypoglycemia without coma: Secondary | ICD-10-CM | POA: Diagnosis not present

## 2020-05-19 DIAGNOSIS — M6259 Muscle wasting and atrophy, not elsewhere classified, multiple sites: Secondary | ICD-10-CM | POA: Diagnosis not present

## 2020-05-19 DIAGNOSIS — I6330 Cerebral infarction due to thrombosis of unspecified cerebral artery: Secondary | ICD-10-CM | POA: Diagnosis not present

## 2020-05-20 DIAGNOSIS — E11649 Type 2 diabetes mellitus with hypoglycemia without coma: Secondary | ICD-10-CM | POA: Diagnosis not present

## 2020-05-20 DIAGNOSIS — I6330 Cerebral infarction due to thrombosis of unspecified cerebral artery: Secondary | ICD-10-CM | POA: Diagnosis not present

## 2020-05-20 DIAGNOSIS — M4802 Spinal stenosis, cervical region: Secondary | ICD-10-CM | POA: Diagnosis not present

## 2020-05-20 DIAGNOSIS — M6259 Muscle wasting and atrophy, not elsewhere classified, multiple sites: Secondary | ICD-10-CM | POA: Diagnosis not present

## 2020-05-21 DIAGNOSIS — M6259 Muscle wasting and atrophy, not elsewhere classified, multiple sites: Secondary | ICD-10-CM | POA: Diagnosis not present

## 2020-05-21 DIAGNOSIS — I6330 Cerebral infarction due to thrombosis of unspecified cerebral artery: Secondary | ICD-10-CM | POA: Diagnosis not present

## 2020-05-21 DIAGNOSIS — E11649 Type 2 diabetes mellitus with hypoglycemia without coma: Secondary | ICD-10-CM | POA: Diagnosis not present

## 2020-05-21 DIAGNOSIS — M4802 Spinal stenosis, cervical region: Secondary | ICD-10-CM | POA: Diagnosis not present

## 2020-05-22 DIAGNOSIS — M6259 Muscle wasting and atrophy, not elsewhere classified, multiple sites: Secondary | ICD-10-CM | POA: Diagnosis not present

## 2020-05-22 DIAGNOSIS — I6330 Cerebral infarction due to thrombosis of unspecified cerebral artery: Secondary | ICD-10-CM | POA: Diagnosis not present

## 2020-05-22 DIAGNOSIS — M4802 Spinal stenosis, cervical region: Secondary | ICD-10-CM | POA: Diagnosis not present

## 2020-05-22 DIAGNOSIS — E11649 Type 2 diabetes mellitus with hypoglycemia without coma: Secondary | ICD-10-CM | POA: Diagnosis not present

## 2020-05-25 DIAGNOSIS — I69315 Cognitive social or emotional deficit following cerebral infarction: Secondary | ICD-10-CM | POA: Diagnosis not present

## 2020-05-25 DIAGNOSIS — M6259 Muscle wasting and atrophy, not elsewhere classified, multiple sites: Secondary | ICD-10-CM | POA: Diagnosis not present

## 2020-05-25 DIAGNOSIS — S72141S Displaced intertrochanteric fracture of right femur, sequela: Secondary | ICD-10-CM | POA: Diagnosis not present

## 2020-05-25 DIAGNOSIS — M4802 Spinal stenosis, cervical region: Secondary | ICD-10-CM | POA: Diagnosis not present

## 2020-05-25 DIAGNOSIS — E11649 Type 2 diabetes mellitus with hypoglycemia without coma: Secondary | ICD-10-CM | POA: Diagnosis not present

## 2020-05-25 DIAGNOSIS — I6330 Cerebral infarction due to thrombosis of unspecified cerebral artery: Secondary | ICD-10-CM | POA: Diagnosis not present

## 2020-05-26 DIAGNOSIS — I2510 Atherosclerotic heart disease of native coronary artery without angina pectoris: Secondary | ICD-10-CM | POA: Diagnosis not present

## 2020-05-26 DIAGNOSIS — M4802 Spinal stenosis, cervical region: Secondary | ICD-10-CM | POA: Diagnosis not present

## 2020-05-26 DIAGNOSIS — E11649 Type 2 diabetes mellitus with hypoglycemia without coma: Secondary | ICD-10-CM | POA: Diagnosis not present

## 2020-05-26 DIAGNOSIS — M4800 Spinal stenosis, site unspecified: Secondary | ICD-10-CM | POA: Diagnosis not present

## 2020-05-26 DIAGNOSIS — S72141S Displaced intertrochanteric fracture of right femur, sequela: Secondary | ICD-10-CM | POA: Diagnosis not present

## 2020-05-26 DIAGNOSIS — I10 Essential (primary) hypertension: Secondary | ICD-10-CM | POA: Diagnosis not present

## 2020-05-26 DIAGNOSIS — I69315 Cognitive social or emotional deficit following cerebral infarction: Secondary | ICD-10-CM | POA: Diagnosis not present

## 2020-05-26 DIAGNOSIS — I6330 Cerebral infarction due to thrombosis of unspecified cerebral artery: Secondary | ICD-10-CM | POA: Diagnosis not present

## 2020-05-26 DIAGNOSIS — M6259 Muscle wasting and atrophy, not elsewhere classified, multiple sites: Secondary | ICD-10-CM | POA: Diagnosis not present

## 2020-05-29 DIAGNOSIS — M6259 Muscle wasting and atrophy, not elsewhere classified, multiple sites: Secondary | ICD-10-CM | POA: Diagnosis not present

## 2020-05-29 DIAGNOSIS — E11649 Type 2 diabetes mellitus with hypoglycemia without coma: Secondary | ICD-10-CM | POA: Diagnosis not present

## 2020-05-29 DIAGNOSIS — S72141S Displaced intertrochanteric fracture of right femur, sequela: Secondary | ICD-10-CM | POA: Diagnosis not present

## 2020-05-29 DIAGNOSIS — I69315 Cognitive social or emotional deficit following cerebral infarction: Secondary | ICD-10-CM | POA: Diagnosis not present

## 2020-05-29 DIAGNOSIS — M4802 Spinal stenosis, cervical region: Secondary | ICD-10-CM | POA: Diagnosis not present

## 2020-05-29 DIAGNOSIS — I6330 Cerebral infarction due to thrombosis of unspecified cerebral artery: Secondary | ICD-10-CM | POA: Diagnosis not present

## 2020-05-30 DIAGNOSIS — M4802 Spinal stenosis, cervical region: Secondary | ICD-10-CM | POA: Diagnosis not present

## 2020-05-30 DIAGNOSIS — I69315 Cognitive social or emotional deficit following cerebral infarction: Secondary | ICD-10-CM | POA: Diagnosis not present

## 2020-05-30 DIAGNOSIS — M6259 Muscle wasting and atrophy, not elsewhere classified, multiple sites: Secondary | ICD-10-CM | POA: Diagnosis not present

## 2020-05-30 DIAGNOSIS — S72141S Displaced intertrochanteric fracture of right femur, sequela: Secondary | ICD-10-CM | POA: Diagnosis not present

## 2020-05-30 DIAGNOSIS — E11649 Type 2 diabetes mellitus with hypoglycemia without coma: Secondary | ICD-10-CM | POA: Diagnosis not present

## 2020-05-30 DIAGNOSIS — I6330 Cerebral infarction due to thrombosis of unspecified cerebral artery: Secondary | ICD-10-CM | POA: Diagnosis not present

## 2020-06-01 DIAGNOSIS — M6259 Muscle wasting and atrophy, not elsewhere classified, multiple sites: Secondary | ICD-10-CM | POA: Diagnosis not present

## 2020-06-01 DIAGNOSIS — S72141S Displaced intertrochanteric fracture of right femur, sequela: Secondary | ICD-10-CM | POA: Diagnosis not present

## 2020-06-01 DIAGNOSIS — I69315 Cognitive social or emotional deficit following cerebral infarction: Secondary | ICD-10-CM | POA: Diagnosis not present

## 2020-06-01 DIAGNOSIS — I6330 Cerebral infarction due to thrombosis of unspecified cerebral artery: Secondary | ICD-10-CM | POA: Diagnosis not present

## 2020-06-01 DIAGNOSIS — E11649 Type 2 diabetes mellitus with hypoglycemia without coma: Secondary | ICD-10-CM | POA: Diagnosis not present

## 2020-06-01 DIAGNOSIS — M4802 Spinal stenosis, cervical region: Secondary | ICD-10-CM | POA: Diagnosis not present

## 2020-06-02 DIAGNOSIS — S72141S Displaced intertrochanteric fracture of right femur, sequela: Secondary | ICD-10-CM | POA: Diagnosis not present

## 2020-06-02 DIAGNOSIS — I6330 Cerebral infarction due to thrombosis of unspecified cerebral artery: Secondary | ICD-10-CM | POA: Diagnosis not present

## 2020-06-02 DIAGNOSIS — I69315 Cognitive social or emotional deficit following cerebral infarction: Secondary | ICD-10-CM | POA: Diagnosis not present

## 2020-06-02 DIAGNOSIS — M4802 Spinal stenosis, cervical region: Secondary | ICD-10-CM | POA: Diagnosis not present

## 2020-06-02 DIAGNOSIS — M6259 Muscle wasting and atrophy, not elsewhere classified, multiple sites: Secondary | ICD-10-CM | POA: Diagnosis not present

## 2020-06-02 DIAGNOSIS — E11649 Type 2 diabetes mellitus with hypoglycemia without coma: Secondary | ICD-10-CM | POA: Diagnosis not present

## 2020-06-03 DIAGNOSIS — E11649 Type 2 diabetes mellitus with hypoglycemia without coma: Secondary | ICD-10-CM | POA: Diagnosis not present

## 2020-06-03 DIAGNOSIS — M6259 Muscle wasting and atrophy, not elsewhere classified, multiple sites: Secondary | ICD-10-CM | POA: Diagnosis not present

## 2020-06-03 DIAGNOSIS — M4802 Spinal stenosis, cervical region: Secondary | ICD-10-CM | POA: Diagnosis not present

## 2020-06-03 DIAGNOSIS — I69315 Cognitive social or emotional deficit following cerebral infarction: Secondary | ICD-10-CM | POA: Diagnosis not present

## 2020-06-03 DIAGNOSIS — S72141S Displaced intertrochanteric fracture of right femur, sequela: Secondary | ICD-10-CM | POA: Diagnosis not present

## 2020-06-03 DIAGNOSIS — I6330 Cerebral infarction due to thrombosis of unspecified cerebral artery: Secondary | ICD-10-CM | POA: Diagnosis not present

## 2020-06-04 DIAGNOSIS — M4802 Spinal stenosis, cervical region: Secondary | ICD-10-CM | POA: Diagnosis not present

## 2020-06-04 DIAGNOSIS — S72141S Displaced intertrochanteric fracture of right femur, sequela: Secondary | ICD-10-CM | POA: Diagnosis not present

## 2020-06-04 DIAGNOSIS — E11649 Type 2 diabetes mellitus with hypoglycemia without coma: Secondary | ICD-10-CM | POA: Diagnosis not present

## 2020-06-04 DIAGNOSIS — M6259 Muscle wasting and atrophy, not elsewhere classified, multiple sites: Secondary | ICD-10-CM | POA: Diagnosis not present

## 2020-06-04 DIAGNOSIS — I69315 Cognitive social or emotional deficit following cerebral infarction: Secondary | ICD-10-CM | POA: Diagnosis not present

## 2020-06-04 DIAGNOSIS — I6330 Cerebral infarction due to thrombosis of unspecified cerebral artery: Secondary | ICD-10-CM | POA: Diagnosis not present

## 2020-06-05 DIAGNOSIS — S72141S Displaced intertrochanteric fracture of right femur, sequela: Secondary | ICD-10-CM | POA: Diagnosis not present

## 2020-06-05 DIAGNOSIS — M4802 Spinal stenosis, cervical region: Secondary | ICD-10-CM | POA: Diagnosis not present

## 2020-06-05 DIAGNOSIS — I6330 Cerebral infarction due to thrombosis of unspecified cerebral artery: Secondary | ICD-10-CM | POA: Diagnosis not present

## 2020-06-05 DIAGNOSIS — I69315 Cognitive social or emotional deficit following cerebral infarction: Secondary | ICD-10-CM | POA: Diagnosis not present

## 2020-06-05 DIAGNOSIS — M6259 Muscle wasting and atrophy, not elsewhere classified, multiple sites: Secondary | ICD-10-CM | POA: Diagnosis not present

## 2020-06-05 DIAGNOSIS — E11649 Type 2 diabetes mellitus with hypoglycemia without coma: Secondary | ICD-10-CM | POA: Diagnosis not present

## 2020-06-08 DIAGNOSIS — M6259 Muscle wasting and atrophy, not elsewhere classified, multiple sites: Secondary | ICD-10-CM | POA: Diagnosis not present

## 2020-06-08 DIAGNOSIS — I6330 Cerebral infarction due to thrombosis of unspecified cerebral artery: Secondary | ICD-10-CM | POA: Diagnosis not present

## 2020-06-08 DIAGNOSIS — S72141S Displaced intertrochanteric fracture of right femur, sequela: Secondary | ICD-10-CM | POA: Diagnosis not present

## 2020-06-08 DIAGNOSIS — I69315 Cognitive social or emotional deficit following cerebral infarction: Secondary | ICD-10-CM | POA: Diagnosis not present

## 2020-06-08 DIAGNOSIS — E11649 Type 2 diabetes mellitus with hypoglycemia without coma: Secondary | ICD-10-CM | POA: Diagnosis not present

## 2020-06-08 DIAGNOSIS — M4802 Spinal stenosis, cervical region: Secondary | ICD-10-CM | POA: Diagnosis not present

## 2020-06-09 DIAGNOSIS — M6259 Muscle wasting and atrophy, not elsewhere classified, multiple sites: Secondary | ICD-10-CM | POA: Diagnosis not present

## 2020-06-09 DIAGNOSIS — M4802 Spinal stenosis, cervical region: Secondary | ICD-10-CM | POA: Diagnosis not present

## 2020-06-09 DIAGNOSIS — S72141S Displaced intertrochanteric fracture of right femur, sequela: Secondary | ICD-10-CM | POA: Diagnosis not present

## 2020-06-09 DIAGNOSIS — I6330 Cerebral infarction due to thrombosis of unspecified cerebral artery: Secondary | ICD-10-CM | POA: Diagnosis not present

## 2020-06-09 DIAGNOSIS — I69315 Cognitive social or emotional deficit following cerebral infarction: Secondary | ICD-10-CM | POA: Diagnosis not present

## 2020-06-09 DIAGNOSIS — E11649 Type 2 diabetes mellitus with hypoglycemia without coma: Secondary | ICD-10-CM | POA: Diagnosis not present

## 2020-06-10 DIAGNOSIS — I69315 Cognitive social or emotional deficit following cerebral infarction: Secondary | ICD-10-CM | POA: Diagnosis not present

## 2020-06-10 DIAGNOSIS — I6330 Cerebral infarction due to thrombosis of unspecified cerebral artery: Secondary | ICD-10-CM | POA: Diagnosis not present

## 2020-06-10 DIAGNOSIS — S72141S Displaced intertrochanteric fracture of right femur, sequela: Secondary | ICD-10-CM | POA: Diagnosis not present

## 2020-06-10 DIAGNOSIS — M4802 Spinal stenosis, cervical region: Secondary | ICD-10-CM | POA: Diagnosis not present

## 2020-06-10 DIAGNOSIS — M6259 Muscle wasting and atrophy, not elsewhere classified, multiple sites: Secondary | ICD-10-CM | POA: Diagnosis not present

## 2020-06-10 DIAGNOSIS — E11649 Type 2 diabetes mellitus with hypoglycemia without coma: Secondary | ICD-10-CM | POA: Diagnosis not present

## 2020-06-11 DIAGNOSIS — M4802 Spinal stenosis, cervical region: Secondary | ICD-10-CM | POA: Diagnosis not present

## 2020-06-11 DIAGNOSIS — E11649 Type 2 diabetes mellitus with hypoglycemia without coma: Secondary | ICD-10-CM | POA: Diagnosis not present

## 2020-06-11 DIAGNOSIS — M6259 Muscle wasting and atrophy, not elsewhere classified, multiple sites: Secondary | ICD-10-CM | POA: Diagnosis not present

## 2020-06-11 DIAGNOSIS — S72141S Displaced intertrochanteric fracture of right femur, sequela: Secondary | ICD-10-CM | POA: Diagnosis not present

## 2020-06-11 DIAGNOSIS — I6330 Cerebral infarction due to thrombosis of unspecified cerebral artery: Secondary | ICD-10-CM | POA: Diagnosis not present

## 2020-06-11 DIAGNOSIS — I69315 Cognitive social or emotional deficit following cerebral infarction: Secondary | ICD-10-CM | POA: Diagnosis not present

## 2020-06-12 DIAGNOSIS — S72141S Displaced intertrochanteric fracture of right femur, sequela: Secondary | ICD-10-CM | POA: Diagnosis not present

## 2020-06-12 DIAGNOSIS — E11649 Type 2 diabetes mellitus with hypoglycemia without coma: Secondary | ICD-10-CM | POA: Diagnosis not present

## 2020-06-12 DIAGNOSIS — I6330 Cerebral infarction due to thrombosis of unspecified cerebral artery: Secondary | ICD-10-CM | POA: Diagnosis not present

## 2020-06-12 DIAGNOSIS — M4802 Spinal stenosis, cervical region: Secondary | ICD-10-CM | POA: Diagnosis not present

## 2020-06-12 DIAGNOSIS — M6259 Muscle wasting and atrophy, not elsewhere classified, multiple sites: Secondary | ICD-10-CM | POA: Diagnosis not present

## 2020-06-12 DIAGNOSIS — I69315 Cognitive social or emotional deficit following cerebral infarction: Secondary | ICD-10-CM | POA: Diagnosis not present

## 2020-06-15 DIAGNOSIS — I69315 Cognitive social or emotional deficit following cerebral infarction: Secondary | ICD-10-CM | POA: Diagnosis not present

## 2020-06-15 DIAGNOSIS — S72141S Displaced intertrochanteric fracture of right femur, sequela: Secondary | ICD-10-CM | POA: Diagnosis not present

## 2020-06-15 DIAGNOSIS — M4802 Spinal stenosis, cervical region: Secondary | ICD-10-CM | POA: Diagnosis not present

## 2020-06-15 DIAGNOSIS — I6330 Cerebral infarction due to thrombosis of unspecified cerebral artery: Secondary | ICD-10-CM | POA: Diagnosis not present

## 2020-06-15 DIAGNOSIS — M6259 Muscle wasting and atrophy, not elsewhere classified, multiple sites: Secondary | ICD-10-CM | POA: Diagnosis not present

## 2020-06-15 DIAGNOSIS — E11649 Type 2 diabetes mellitus with hypoglycemia without coma: Secondary | ICD-10-CM | POA: Diagnosis not present

## 2020-06-16 DIAGNOSIS — M6259 Muscle wasting and atrophy, not elsewhere classified, multiple sites: Secondary | ICD-10-CM | POA: Diagnosis not present

## 2020-06-16 DIAGNOSIS — I6330 Cerebral infarction due to thrombosis of unspecified cerebral artery: Secondary | ICD-10-CM | POA: Diagnosis not present

## 2020-06-16 DIAGNOSIS — S72141S Displaced intertrochanteric fracture of right femur, sequela: Secondary | ICD-10-CM | POA: Diagnosis not present

## 2020-06-16 DIAGNOSIS — E11649 Type 2 diabetes mellitus with hypoglycemia without coma: Secondary | ICD-10-CM | POA: Diagnosis not present

## 2020-06-16 DIAGNOSIS — I69315 Cognitive social or emotional deficit following cerebral infarction: Secondary | ICD-10-CM | POA: Diagnosis not present

## 2020-06-16 DIAGNOSIS — M4802 Spinal stenosis, cervical region: Secondary | ICD-10-CM | POA: Diagnosis not present

## 2020-06-17 DIAGNOSIS — I6330 Cerebral infarction due to thrombosis of unspecified cerebral artery: Secondary | ICD-10-CM | POA: Diagnosis not present

## 2020-06-17 DIAGNOSIS — E11649 Type 2 diabetes mellitus with hypoglycemia without coma: Secondary | ICD-10-CM | POA: Diagnosis not present

## 2020-06-17 DIAGNOSIS — M4802 Spinal stenosis, cervical region: Secondary | ICD-10-CM | POA: Diagnosis not present

## 2020-06-17 DIAGNOSIS — I69315 Cognitive social or emotional deficit following cerebral infarction: Secondary | ICD-10-CM | POA: Diagnosis not present

## 2020-06-17 DIAGNOSIS — M6259 Muscle wasting and atrophy, not elsewhere classified, multiple sites: Secondary | ICD-10-CM | POA: Diagnosis not present

## 2020-06-17 DIAGNOSIS — S72141S Displaced intertrochanteric fracture of right femur, sequela: Secondary | ICD-10-CM | POA: Diagnosis not present

## 2020-06-18 DIAGNOSIS — M6259 Muscle wasting and atrophy, not elsewhere classified, multiple sites: Secondary | ICD-10-CM | POA: Diagnosis not present

## 2020-06-18 DIAGNOSIS — I69315 Cognitive social or emotional deficit following cerebral infarction: Secondary | ICD-10-CM | POA: Diagnosis not present

## 2020-06-18 DIAGNOSIS — I6330 Cerebral infarction due to thrombosis of unspecified cerebral artery: Secondary | ICD-10-CM | POA: Diagnosis not present

## 2020-06-18 DIAGNOSIS — S72141S Displaced intertrochanteric fracture of right femur, sequela: Secondary | ICD-10-CM | POA: Diagnosis not present

## 2020-06-18 DIAGNOSIS — M4802 Spinal stenosis, cervical region: Secondary | ICD-10-CM | POA: Diagnosis not present

## 2020-06-18 DIAGNOSIS — E11649 Type 2 diabetes mellitus with hypoglycemia without coma: Secondary | ICD-10-CM | POA: Diagnosis not present

## 2020-06-24 DIAGNOSIS — I6330 Cerebral infarction due to thrombosis of unspecified cerebral artery: Secondary | ICD-10-CM | POA: Diagnosis not present

## 2020-06-24 DIAGNOSIS — M4802 Spinal stenosis, cervical region: Secondary | ICD-10-CM | POA: Diagnosis not present

## 2020-06-24 DIAGNOSIS — M6259 Muscle wasting and atrophy, not elsewhere classified, multiple sites: Secondary | ICD-10-CM | POA: Diagnosis not present

## 2020-06-24 DIAGNOSIS — E11649 Type 2 diabetes mellitus with hypoglycemia without coma: Secondary | ICD-10-CM | POA: Diagnosis not present

## 2020-06-25 DIAGNOSIS — E11649 Type 2 diabetes mellitus with hypoglycemia without coma: Secondary | ICD-10-CM | POA: Diagnosis not present

## 2020-06-25 DIAGNOSIS — M4802 Spinal stenosis, cervical region: Secondary | ICD-10-CM | POA: Diagnosis not present

## 2020-06-25 DIAGNOSIS — I6330 Cerebral infarction due to thrombosis of unspecified cerebral artery: Secondary | ICD-10-CM | POA: Diagnosis not present

## 2020-06-25 DIAGNOSIS — M6259 Muscle wasting and atrophy, not elsewhere classified, multiple sites: Secondary | ICD-10-CM | POA: Diagnosis not present

## 2020-06-26 DIAGNOSIS — E11649 Type 2 diabetes mellitus with hypoglycemia without coma: Secondary | ICD-10-CM | POA: Diagnosis not present

## 2020-06-26 DIAGNOSIS — M6259 Muscle wasting and atrophy, not elsewhere classified, multiple sites: Secondary | ICD-10-CM | POA: Diagnosis not present

## 2020-06-26 DIAGNOSIS — I6330 Cerebral infarction due to thrombosis of unspecified cerebral artery: Secondary | ICD-10-CM | POA: Diagnosis not present

## 2020-06-26 DIAGNOSIS — M4802 Spinal stenosis, cervical region: Secondary | ICD-10-CM | POA: Diagnosis not present

## 2020-06-29 DIAGNOSIS — M6259 Muscle wasting and atrophy, not elsewhere classified, multiple sites: Secondary | ICD-10-CM | POA: Diagnosis not present

## 2020-06-29 DIAGNOSIS — I6330 Cerebral infarction due to thrombosis of unspecified cerebral artery: Secondary | ICD-10-CM | POA: Diagnosis not present

## 2020-06-29 DIAGNOSIS — E11649 Type 2 diabetes mellitus with hypoglycemia without coma: Secondary | ICD-10-CM | POA: Diagnosis not present

## 2020-06-29 DIAGNOSIS — M4802 Spinal stenosis, cervical region: Secondary | ICD-10-CM | POA: Diagnosis not present

## 2020-06-30 DIAGNOSIS — E11649 Type 2 diabetes mellitus with hypoglycemia without coma: Secondary | ICD-10-CM | POA: Diagnosis not present

## 2020-06-30 DIAGNOSIS — M6259 Muscle wasting and atrophy, not elsewhere classified, multiple sites: Secondary | ICD-10-CM | POA: Diagnosis not present

## 2020-06-30 DIAGNOSIS — M4802 Spinal stenosis, cervical region: Secondary | ICD-10-CM | POA: Diagnosis not present

## 2020-06-30 DIAGNOSIS — I6330 Cerebral infarction due to thrombosis of unspecified cerebral artery: Secondary | ICD-10-CM | POA: Diagnosis not present

## 2020-07-01 DIAGNOSIS — M6259 Muscle wasting and atrophy, not elsewhere classified, multiple sites: Secondary | ICD-10-CM | POA: Diagnosis not present

## 2020-07-01 DIAGNOSIS — I6330 Cerebral infarction due to thrombosis of unspecified cerebral artery: Secondary | ICD-10-CM | POA: Diagnosis not present

## 2020-07-01 DIAGNOSIS — E11649 Type 2 diabetes mellitus with hypoglycemia without coma: Secondary | ICD-10-CM | POA: Diagnosis not present

## 2020-07-01 DIAGNOSIS — M4802 Spinal stenosis, cervical region: Secondary | ICD-10-CM | POA: Diagnosis not present

## 2020-07-07 DIAGNOSIS — E11649 Type 2 diabetes mellitus with hypoglycemia without coma: Secondary | ICD-10-CM | POA: Diagnosis not present

## 2020-07-07 DIAGNOSIS — M4802 Spinal stenosis, cervical region: Secondary | ICD-10-CM | POA: Diagnosis not present

## 2020-07-07 DIAGNOSIS — I6330 Cerebral infarction due to thrombosis of unspecified cerebral artery: Secondary | ICD-10-CM | POA: Diagnosis not present

## 2020-07-07 DIAGNOSIS — M6259 Muscle wasting and atrophy, not elsewhere classified, multiple sites: Secondary | ICD-10-CM | POA: Diagnosis not present

## 2020-07-13 DIAGNOSIS — E785 Hyperlipidemia, unspecified: Secondary | ICD-10-CM | POA: Diagnosis not present

## 2020-07-13 DIAGNOSIS — I2510 Atherosclerotic heart disease of native coronary artery without angina pectoris: Secondary | ICD-10-CM | POA: Diagnosis not present

## 2020-07-13 DIAGNOSIS — E119 Type 2 diabetes mellitus without complications: Secondary | ICD-10-CM | POA: Diagnosis not present

## 2020-07-13 DIAGNOSIS — I10 Essential (primary) hypertension: Secondary | ICD-10-CM | POA: Diagnosis not present

## 2020-07-13 DIAGNOSIS — D649 Anemia, unspecified: Secondary | ICD-10-CM | POA: Diagnosis not present

## 2020-09-07 DIAGNOSIS — I10 Essential (primary) hypertension: Secondary | ICD-10-CM | POA: Diagnosis not present

## 2020-09-07 DIAGNOSIS — I6330 Cerebral infarction due to thrombosis of unspecified cerebral artery: Secondary | ICD-10-CM | POA: Diagnosis not present

## 2020-09-07 DIAGNOSIS — E11649 Type 2 diabetes mellitus with hypoglycemia without coma: Secondary | ICD-10-CM | POA: Diagnosis not present

## 2020-09-07 DIAGNOSIS — R1312 Dysphagia, oropharyngeal phase: Secondary | ICD-10-CM | POA: Diagnosis not present

## 2020-09-08 DIAGNOSIS — E11649 Type 2 diabetes mellitus with hypoglycemia without coma: Secondary | ICD-10-CM | POA: Diagnosis not present

## 2020-09-08 DIAGNOSIS — I6330 Cerebral infarction due to thrombosis of unspecified cerebral artery: Secondary | ICD-10-CM | POA: Diagnosis not present

## 2020-09-08 DIAGNOSIS — R1312 Dysphagia, oropharyngeal phase: Secondary | ICD-10-CM | POA: Diagnosis not present

## 2020-09-08 DIAGNOSIS — I10 Essential (primary) hypertension: Secondary | ICD-10-CM | POA: Diagnosis not present

## 2020-09-08 DIAGNOSIS — E785 Hyperlipidemia, unspecified: Secondary | ICD-10-CM | POA: Diagnosis not present

## 2020-09-08 DIAGNOSIS — K5900 Constipation, unspecified: Secondary | ICD-10-CM | POA: Diagnosis not present

## 2020-09-08 DIAGNOSIS — D649 Anemia, unspecified: Secondary | ICD-10-CM | POA: Diagnosis not present

## 2020-09-09 DIAGNOSIS — E11649 Type 2 diabetes mellitus with hypoglycemia without coma: Secondary | ICD-10-CM | POA: Diagnosis not present

## 2020-09-09 DIAGNOSIS — R1312 Dysphagia, oropharyngeal phase: Secondary | ICD-10-CM | POA: Diagnosis not present

## 2020-09-09 DIAGNOSIS — I10 Essential (primary) hypertension: Secondary | ICD-10-CM | POA: Diagnosis not present

## 2020-09-09 DIAGNOSIS — I6330 Cerebral infarction due to thrombosis of unspecified cerebral artery: Secondary | ICD-10-CM | POA: Diagnosis not present

## 2020-09-10 DIAGNOSIS — I10 Essential (primary) hypertension: Secondary | ICD-10-CM | POA: Diagnosis not present

## 2020-09-10 DIAGNOSIS — R1312 Dysphagia, oropharyngeal phase: Secondary | ICD-10-CM | POA: Diagnosis not present

## 2020-09-10 DIAGNOSIS — E11649 Type 2 diabetes mellitus with hypoglycemia without coma: Secondary | ICD-10-CM | POA: Diagnosis not present

## 2020-09-10 DIAGNOSIS — I6330 Cerebral infarction due to thrombosis of unspecified cerebral artery: Secondary | ICD-10-CM | POA: Diagnosis not present

## 2020-09-11 DIAGNOSIS — E11649 Type 2 diabetes mellitus with hypoglycemia without coma: Secondary | ICD-10-CM | POA: Diagnosis not present

## 2020-09-11 DIAGNOSIS — R1312 Dysphagia, oropharyngeal phase: Secondary | ICD-10-CM | POA: Diagnosis not present

## 2020-09-11 DIAGNOSIS — I6330 Cerebral infarction due to thrombosis of unspecified cerebral artery: Secondary | ICD-10-CM | POA: Diagnosis not present

## 2020-09-11 DIAGNOSIS — I10 Essential (primary) hypertension: Secondary | ICD-10-CM | POA: Diagnosis not present

## 2020-09-13 DIAGNOSIS — I6330 Cerebral infarction due to thrombosis of unspecified cerebral artery: Secondary | ICD-10-CM | POA: Diagnosis not present

## 2020-09-13 DIAGNOSIS — E11649 Type 2 diabetes mellitus with hypoglycemia without coma: Secondary | ICD-10-CM | POA: Diagnosis not present

## 2020-09-13 DIAGNOSIS — I10 Essential (primary) hypertension: Secondary | ICD-10-CM | POA: Diagnosis not present

## 2020-09-13 DIAGNOSIS — R1312 Dysphagia, oropharyngeal phase: Secondary | ICD-10-CM | POA: Diagnosis not present

## 2020-09-14 DIAGNOSIS — I10 Essential (primary) hypertension: Secondary | ICD-10-CM | POA: Diagnosis not present

## 2020-09-14 DIAGNOSIS — R1312 Dysphagia, oropharyngeal phase: Secondary | ICD-10-CM | POA: Diagnosis not present

## 2020-09-14 DIAGNOSIS — E11649 Type 2 diabetes mellitus with hypoglycemia without coma: Secondary | ICD-10-CM | POA: Diagnosis not present

## 2020-09-14 DIAGNOSIS — I6330 Cerebral infarction due to thrombosis of unspecified cerebral artery: Secondary | ICD-10-CM | POA: Diagnosis not present

## 2020-09-15 DIAGNOSIS — I6330 Cerebral infarction due to thrombosis of unspecified cerebral artery: Secondary | ICD-10-CM | POA: Diagnosis not present

## 2020-09-15 DIAGNOSIS — R1312 Dysphagia, oropharyngeal phase: Secondary | ICD-10-CM | POA: Diagnosis not present

## 2020-09-15 DIAGNOSIS — I10 Essential (primary) hypertension: Secondary | ICD-10-CM | POA: Diagnosis not present

## 2020-09-15 DIAGNOSIS — E11649 Type 2 diabetes mellitus with hypoglycemia without coma: Secondary | ICD-10-CM | POA: Diagnosis not present

## 2020-09-16 DIAGNOSIS — I6330 Cerebral infarction due to thrombosis of unspecified cerebral artery: Secondary | ICD-10-CM | POA: Diagnosis not present

## 2020-09-16 DIAGNOSIS — I10 Essential (primary) hypertension: Secondary | ICD-10-CM | POA: Diagnosis not present

## 2020-09-16 DIAGNOSIS — E11649 Type 2 diabetes mellitus with hypoglycemia without coma: Secondary | ICD-10-CM | POA: Diagnosis not present

## 2020-09-16 DIAGNOSIS — R1312 Dysphagia, oropharyngeal phase: Secondary | ICD-10-CM | POA: Diagnosis not present

## 2020-09-22 DIAGNOSIS — I6330 Cerebral infarction due to thrombosis of unspecified cerebral artery: Secondary | ICD-10-CM | POA: Diagnosis not present

## 2020-09-22 DIAGNOSIS — I10 Essential (primary) hypertension: Secondary | ICD-10-CM | POA: Diagnosis not present

## 2020-09-22 DIAGNOSIS — R1312 Dysphagia, oropharyngeal phase: Secondary | ICD-10-CM | POA: Diagnosis not present

## 2020-09-22 DIAGNOSIS — E11649 Type 2 diabetes mellitus with hypoglycemia without coma: Secondary | ICD-10-CM | POA: Diagnosis not present

## 2020-09-24 DIAGNOSIS — I6330 Cerebral infarction due to thrombosis of unspecified cerebral artery: Secondary | ICD-10-CM | POA: Diagnosis not present

## 2020-09-24 DIAGNOSIS — I10 Essential (primary) hypertension: Secondary | ICD-10-CM | POA: Diagnosis not present

## 2020-09-24 DIAGNOSIS — R1312 Dysphagia, oropharyngeal phase: Secondary | ICD-10-CM | POA: Diagnosis not present

## 2020-09-24 DIAGNOSIS — E11649 Type 2 diabetes mellitus with hypoglycemia without coma: Secondary | ICD-10-CM | POA: Diagnosis not present

## 2020-09-25 DIAGNOSIS — I6330 Cerebral infarction due to thrombosis of unspecified cerebral artery: Secondary | ICD-10-CM | POA: Diagnosis not present

## 2020-09-25 DIAGNOSIS — R1312 Dysphagia, oropharyngeal phase: Secondary | ICD-10-CM | POA: Diagnosis not present

## 2020-09-25 DIAGNOSIS — E11649 Type 2 diabetes mellitus with hypoglycemia without coma: Secondary | ICD-10-CM | POA: Diagnosis not present

## 2020-09-25 DIAGNOSIS — I10 Essential (primary) hypertension: Secondary | ICD-10-CM | POA: Diagnosis not present

## 2020-09-29 DIAGNOSIS — I10 Essential (primary) hypertension: Secondary | ICD-10-CM | POA: Diagnosis not present

## 2020-09-29 DIAGNOSIS — E11649 Type 2 diabetes mellitus with hypoglycemia without coma: Secondary | ICD-10-CM | POA: Diagnosis not present

## 2020-09-29 DIAGNOSIS — R1312 Dysphagia, oropharyngeal phase: Secondary | ICD-10-CM | POA: Diagnosis not present

## 2020-09-29 DIAGNOSIS — I6330 Cerebral infarction due to thrombosis of unspecified cerebral artery: Secondary | ICD-10-CM | POA: Diagnosis not present

## 2020-09-30 DIAGNOSIS — R1312 Dysphagia, oropharyngeal phase: Secondary | ICD-10-CM | POA: Diagnosis not present

## 2020-09-30 DIAGNOSIS — E11649 Type 2 diabetes mellitus with hypoglycemia without coma: Secondary | ICD-10-CM | POA: Diagnosis not present

## 2020-09-30 DIAGNOSIS — I6330 Cerebral infarction due to thrombosis of unspecified cerebral artery: Secondary | ICD-10-CM | POA: Diagnosis not present

## 2020-09-30 DIAGNOSIS — I10 Essential (primary) hypertension: Secondary | ICD-10-CM | POA: Diagnosis not present

## 2020-10-01 DIAGNOSIS — R1312 Dysphagia, oropharyngeal phase: Secondary | ICD-10-CM | POA: Diagnosis not present

## 2020-10-01 DIAGNOSIS — E11649 Type 2 diabetes mellitus with hypoglycemia without coma: Secondary | ICD-10-CM | POA: Diagnosis not present

## 2020-10-01 DIAGNOSIS — I10 Essential (primary) hypertension: Secondary | ICD-10-CM | POA: Diagnosis not present

## 2020-10-01 DIAGNOSIS — I6330 Cerebral infarction due to thrombosis of unspecified cerebral artery: Secondary | ICD-10-CM | POA: Diagnosis not present

## 2020-10-02 DIAGNOSIS — I6330 Cerebral infarction due to thrombosis of unspecified cerebral artery: Secondary | ICD-10-CM | POA: Diagnosis not present

## 2020-10-02 DIAGNOSIS — E11649 Type 2 diabetes mellitus with hypoglycemia without coma: Secondary | ICD-10-CM | POA: Diagnosis not present

## 2020-10-02 DIAGNOSIS — R1312 Dysphagia, oropharyngeal phase: Secondary | ICD-10-CM | POA: Diagnosis not present

## 2020-10-02 DIAGNOSIS — I10 Essential (primary) hypertension: Secondary | ICD-10-CM | POA: Diagnosis not present

## 2020-10-06 DIAGNOSIS — R1312 Dysphagia, oropharyngeal phase: Secondary | ICD-10-CM | POA: Diagnosis not present

## 2020-10-06 DIAGNOSIS — E11649 Type 2 diabetes mellitus with hypoglycemia without coma: Secondary | ICD-10-CM | POA: Diagnosis not present

## 2020-10-06 DIAGNOSIS — I6330 Cerebral infarction due to thrombosis of unspecified cerebral artery: Secondary | ICD-10-CM | POA: Diagnosis not present

## 2020-10-06 DIAGNOSIS — I10 Essential (primary) hypertension: Secondary | ICD-10-CM | POA: Diagnosis not present

## 2020-10-07 DIAGNOSIS — I10 Essential (primary) hypertension: Secondary | ICD-10-CM | POA: Diagnosis not present

## 2020-10-07 DIAGNOSIS — R1312 Dysphagia, oropharyngeal phase: Secondary | ICD-10-CM | POA: Diagnosis not present

## 2020-10-07 DIAGNOSIS — I6330 Cerebral infarction due to thrombosis of unspecified cerebral artery: Secondary | ICD-10-CM | POA: Diagnosis not present

## 2020-10-07 DIAGNOSIS — E11649 Type 2 diabetes mellitus with hypoglycemia without coma: Secondary | ICD-10-CM | POA: Diagnosis not present

## 2020-10-08 DIAGNOSIS — I10 Essential (primary) hypertension: Secondary | ICD-10-CM | POA: Diagnosis not present

## 2020-10-08 DIAGNOSIS — R1312 Dysphagia, oropharyngeal phase: Secondary | ICD-10-CM | POA: Diagnosis not present

## 2020-10-08 DIAGNOSIS — E11649 Type 2 diabetes mellitus with hypoglycemia without coma: Secondary | ICD-10-CM | POA: Diagnosis not present

## 2020-10-08 DIAGNOSIS — I6330 Cerebral infarction due to thrombosis of unspecified cerebral artery: Secondary | ICD-10-CM | POA: Diagnosis not present

## 2020-10-09 DIAGNOSIS — I10 Essential (primary) hypertension: Secondary | ICD-10-CM | POA: Diagnosis not present

## 2020-10-09 DIAGNOSIS — E11649 Type 2 diabetes mellitus with hypoglycemia without coma: Secondary | ICD-10-CM | POA: Diagnosis not present

## 2020-10-09 DIAGNOSIS — R1312 Dysphagia, oropharyngeal phase: Secondary | ICD-10-CM | POA: Diagnosis not present

## 2020-10-09 DIAGNOSIS — I6330 Cerebral infarction due to thrombosis of unspecified cerebral artery: Secondary | ICD-10-CM | POA: Diagnosis not present

## 2020-10-13 DIAGNOSIS — R1312 Dysphagia, oropharyngeal phase: Secondary | ICD-10-CM | POA: Diagnosis not present

## 2020-10-13 DIAGNOSIS — I6330 Cerebral infarction due to thrombosis of unspecified cerebral artery: Secondary | ICD-10-CM | POA: Diagnosis not present

## 2020-10-13 DIAGNOSIS — I10 Essential (primary) hypertension: Secondary | ICD-10-CM | POA: Diagnosis not present

## 2020-10-13 DIAGNOSIS — E11649 Type 2 diabetes mellitus with hypoglycemia without coma: Secondary | ICD-10-CM | POA: Diagnosis not present

## 2020-10-14 DIAGNOSIS — I10 Essential (primary) hypertension: Secondary | ICD-10-CM | POA: Diagnosis not present

## 2020-10-14 DIAGNOSIS — R1312 Dysphagia, oropharyngeal phase: Secondary | ICD-10-CM | POA: Diagnosis not present

## 2020-10-14 DIAGNOSIS — E11649 Type 2 diabetes mellitus with hypoglycemia without coma: Secondary | ICD-10-CM | POA: Diagnosis not present

## 2020-10-14 DIAGNOSIS — I6330 Cerebral infarction due to thrombosis of unspecified cerebral artery: Secondary | ICD-10-CM | POA: Diagnosis not present

## 2020-10-15 DIAGNOSIS — E11649 Type 2 diabetes mellitus with hypoglycemia without coma: Secondary | ICD-10-CM | POA: Diagnosis not present

## 2020-10-15 DIAGNOSIS — I10 Essential (primary) hypertension: Secondary | ICD-10-CM | POA: Diagnosis not present

## 2020-10-15 DIAGNOSIS — R1312 Dysphagia, oropharyngeal phase: Secondary | ICD-10-CM | POA: Diagnosis not present

## 2020-10-15 DIAGNOSIS — I6330 Cerebral infarction due to thrombosis of unspecified cerebral artery: Secondary | ICD-10-CM | POA: Diagnosis not present

## 2020-10-16 DIAGNOSIS — E11649 Type 2 diabetes mellitus with hypoglycemia without coma: Secondary | ICD-10-CM | POA: Diagnosis not present

## 2020-10-16 DIAGNOSIS — R1312 Dysphagia, oropharyngeal phase: Secondary | ICD-10-CM | POA: Diagnosis not present

## 2020-10-16 DIAGNOSIS — I6330 Cerebral infarction due to thrombosis of unspecified cerebral artery: Secondary | ICD-10-CM | POA: Diagnosis not present

## 2020-10-16 DIAGNOSIS — I10 Essential (primary) hypertension: Secondary | ICD-10-CM | POA: Diagnosis not present

## 2020-10-20 DIAGNOSIS — I10 Essential (primary) hypertension: Secondary | ICD-10-CM | POA: Diagnosis not present

## 2020-10-20 DIAGNOSIS — E11649 Type 2 diabetes mellitus with hypoglycemia without coma: Secondary | ICD-10-CM | POA: Diagnosis not present

## 2020-10-20 DIAGNOSIS — I6330 Cerebral infarction due to thrombosis of unspecified cerebral artery: Secondary | ICD-10-CM | POA: Diagnosis not present

## 2020-10-20 DIAGNOSIS — R1312 Dysphagia, oropharyngeal phase: Secondary | ICD-10-CM | POA: Diagnosis not present

## 2020-10-21 DIAGNOSIS — I6330 Cerebral infarction due to thrombosis of unspecified cerebral artery: Secondary | ICD-10-CM | POA: Diagnosis not present

## 2020-10-21 DIAGNOSIS — E11649 Type 2 diabetes mellitus with hypoglycemia without coma: Secondary | ICD-10-CM | POA: Diagnosis not present

## 2020-10-21 DIAGNOSIS — I10 Essential (primary) hypertension: Secondary | ICD-10-CM | POA: Diagnosis not present

## 2020-10-21 DIAGNOSIS — R1312 Dysphagia, oropharyngeal phase: Secondary | ICD-10-CM | POA: Diagnosis not present

## 2020-10-22 DIAGNOSIS — H35033 Hypertensive retinopathy, bilateral: Secondary | ICD-10-CM | POA: Diagnosis not present

## 2020-10-22 DIAGNOSIS — I6330 Cerebral infarction due to thrombosis of unspecified cerebral artery: Secondary | ICD-10-CM | POA: Diagnosis not present

## 2020-10-22 DIAGNOSIS — R1312 Dysphagia, oropharyngeal phase: Secondary | ICD-10-CM | POA: Diagnosis not present

## 2020-10-22 DIAGNOSIS — Z794 Long term (current) use of insulin: Secondary | ICD-10-CM | POA: Diagnosis not present

## 2020-10-22 DIAGNOSIS — E113513 Type 2 diabetes mellitus with proliferative diabetic retinopathy with macular edema, bilateral: Secondary | ICD-10-CM | POA: Diagnosis not present

## 2020-10-22 DIAGNOSIS — I10 Essential (primary) hypertension: Secondary | ICD-10-CM | POA: Diagnosis not present

## 2020-10-22 DIAGNOSIS — Z961 Presence of intraocular lens: Secondary | ICD-10-CM | POA: Diagnosis not present

## 2020-10-22 DIAGNOSIS — E11649 Type 2 diabetes mellitus with hypoglycemia without coma: Secondary | ICD-10-CM | POA: Diagnosis not present

## 2020-10-23 DIAGNOSIS — I6330 Cerebral infarction due to thrombosis of unspecified cerebral artery: Secondary | ICD-10-CM | POA: Diagnosis not present

## 2020-10-23 DIAGNOSIS — I10 Essential (primary) hypertension: Secondary | ICD-10-CM | POA: Diagnosis not present

## 2020-10-23 DIAGNOSIS — E11649 Type 2 diabetes mellitus with hypoglycemia without coma: Secondary | ICD-10-CM | POA: Diagnosis not present

## 2020-10-23 DIAGNOSIS — R1312 Dysphagia, oropharyngeal phase: Secondary | ICD-10-CM | POA: Diagnosis not present

## 2020-10-27 DIAGNOSIS — E11649 Type 2 diabetes mellitus with hypoglycemia without coma: Secondary | ICD-10-CM | POA: Diagnosis not present

## 2020-10-27 DIAGNOSIS — I6330 Cerebral infarction due to thrombosis of unspecified cerebral artery: Secondary | ICD-10-CM | POA: Diagnosis not present

## 2020-10-27 DIAGNOSIS — I10 Essential (primary) hypertension: Secondary | ICD-10-CM | POA: Diagnosis not present

## 2020-10-27 DIAGNOSIS — R1312 Dysphagia, oropharyngeal phase: Secondary | ICD-10-CM | POA: Diagnosis not present

## 2020-10-28 DIAGNOSIS — E11649 Type 2 diabetes mellitus with hypoglycemia without coma: Secondary | ICD-10-CM | POA: Diagnosis not present

## 2020-10-28 DIAGNOSIS — R1312 Dysphagia, oropharyngeal phase: Secondary | ICD-10-CM | POA: Diagnosis not present

## 2020-10-28 DIAGNOSIS — I6330 Cerebral infarction due to thrombosis of unspecified cerebral artery: Secondary | ICD-10-CM | POA: Diagnosis not present

## 2020-10-28 DIAGNOSIS — I10 Essential (primary) hypertension: Secondary | ICD-10-CM | POA: Diagnosis not present

## 2020-10-29 DIAGNOSIS — E11649 Type 2 diabetes mellitus with hypoglycemia without coma: Secondary | ICD-10-CM | POA: Diagnosis not present

## 2020-10-29 DIAGNOSIS — R1312 Dysphagia, oropharyngeal phase: Secondary | ICD-10-CM | POA: Diagnosis not present

## 2020-10-29 DIAGNOSIS — I10 Essential (primary) hypertension: Secondary | ICD-10-CM | POA: Diagnosis not present

## 2020-10-29 DIAGNOSIS — I6330 Cerebral infarction due to thrombosis of unspecified cerebral artery: Secondary | ICD-10-CM | POA: Diagnosis not present

## 2020-10-30 DIAGNOSIS — R1312 Dysphagia, oropharyngeal phase: Secondary | ICD-10-CM | POA: Diagnosis not present

## 2020-10-30 DIAGNOSIS — E11649 Type 2 diabetes mellitus with hypoglycemia without coma: Secondary | ICD-10-CM | POA: Diagnosis not present

## 2020-10-30 DIAGNOSIS — I10 Essential (primary) hypertension: Secondary | ICD-10-CM | POA: Diagnosis not present

## 2020-10-30 DIAGNOSIS — I6330 Cerebral infarction due to thrombosis of unspecified cerebral artery: Secondary | ICD-10-CM | POA: Diagnosis not present

## 2020-11-02 IMAGING — RF DG C-ARM 1-60 MIN
1 series · 4 of 4 positions shown · non-contrast
Comparison: Radiographs earlier the same date.

CLINICAL DATA: Intertrochanteric right femur fracture. ORIF.

EXAM:
DG C-ARM 1-60 MIN; RIGHT FEMUR 2 VIEWS

[Series 1: run · 4 of 4 slices shown]
[im 1/4]
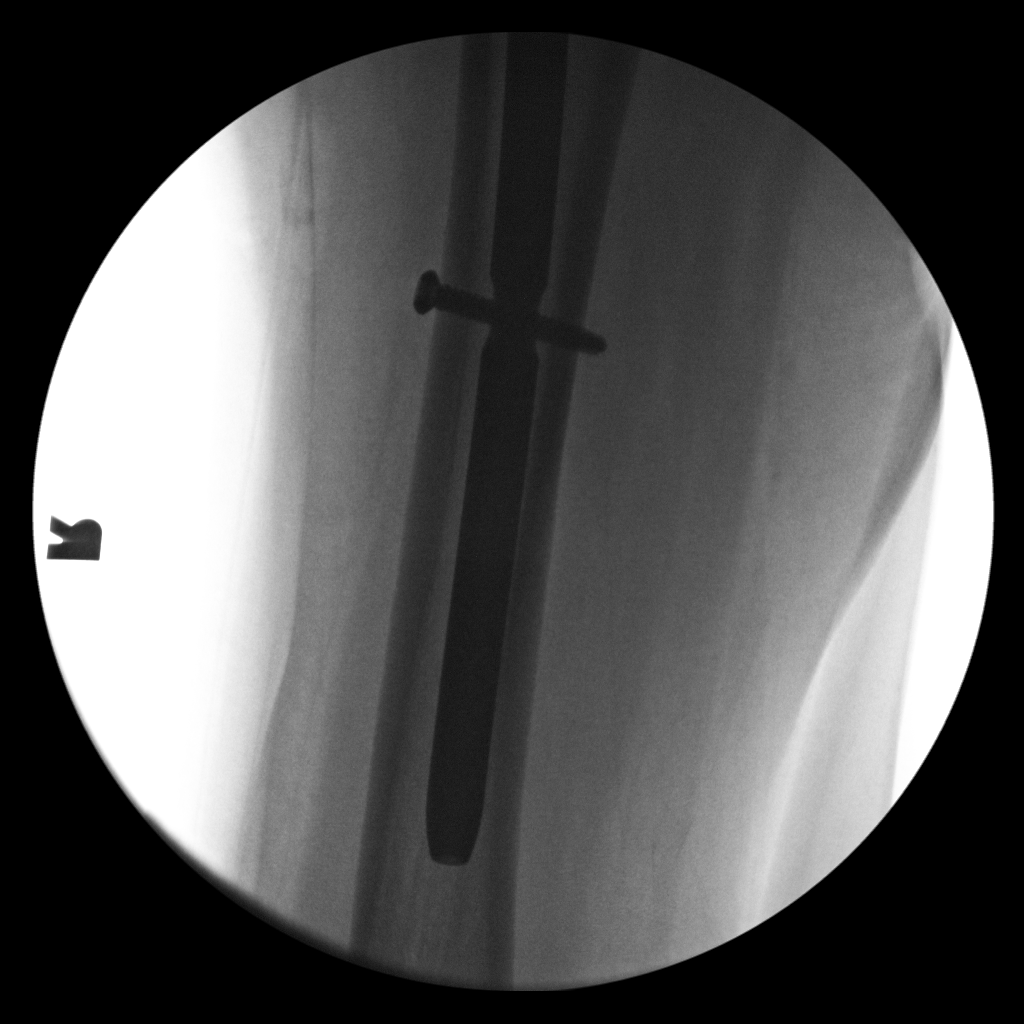
[im 2/4]
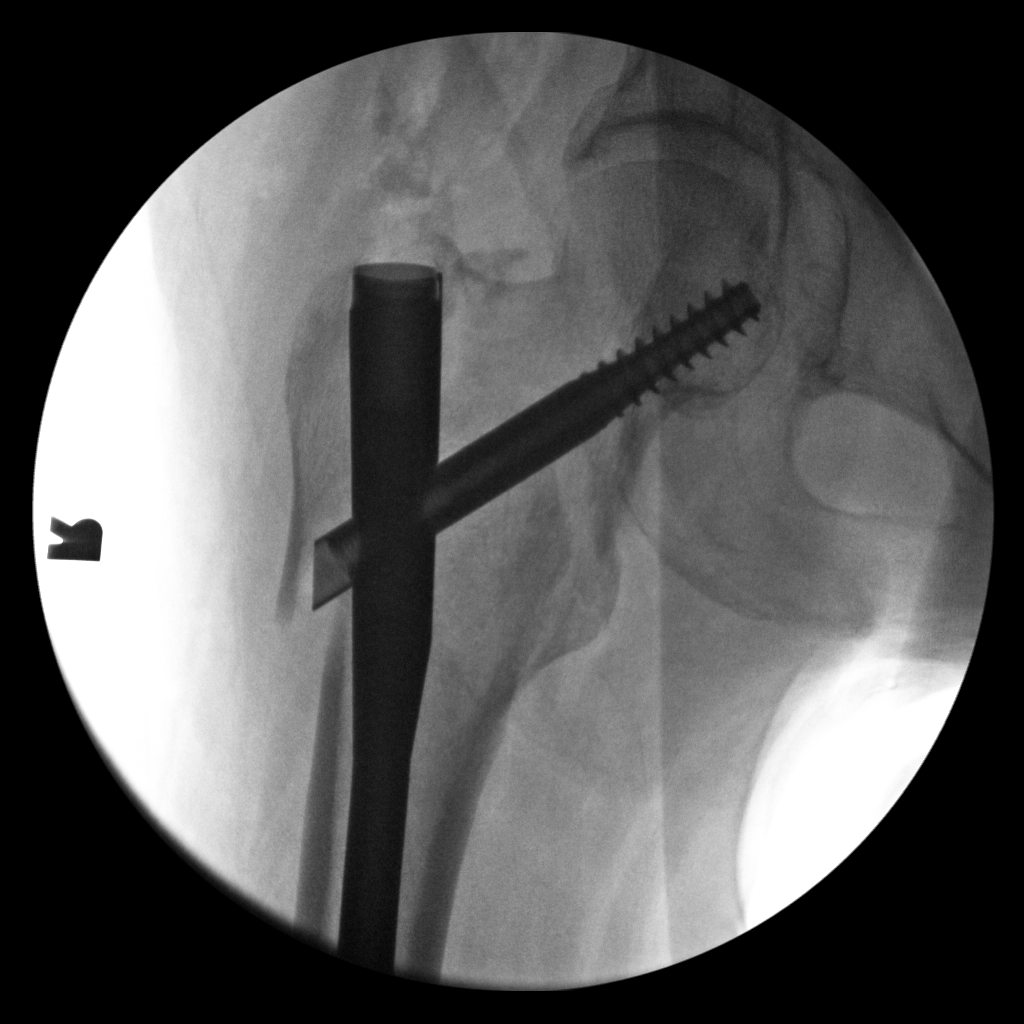
[im 3/4]
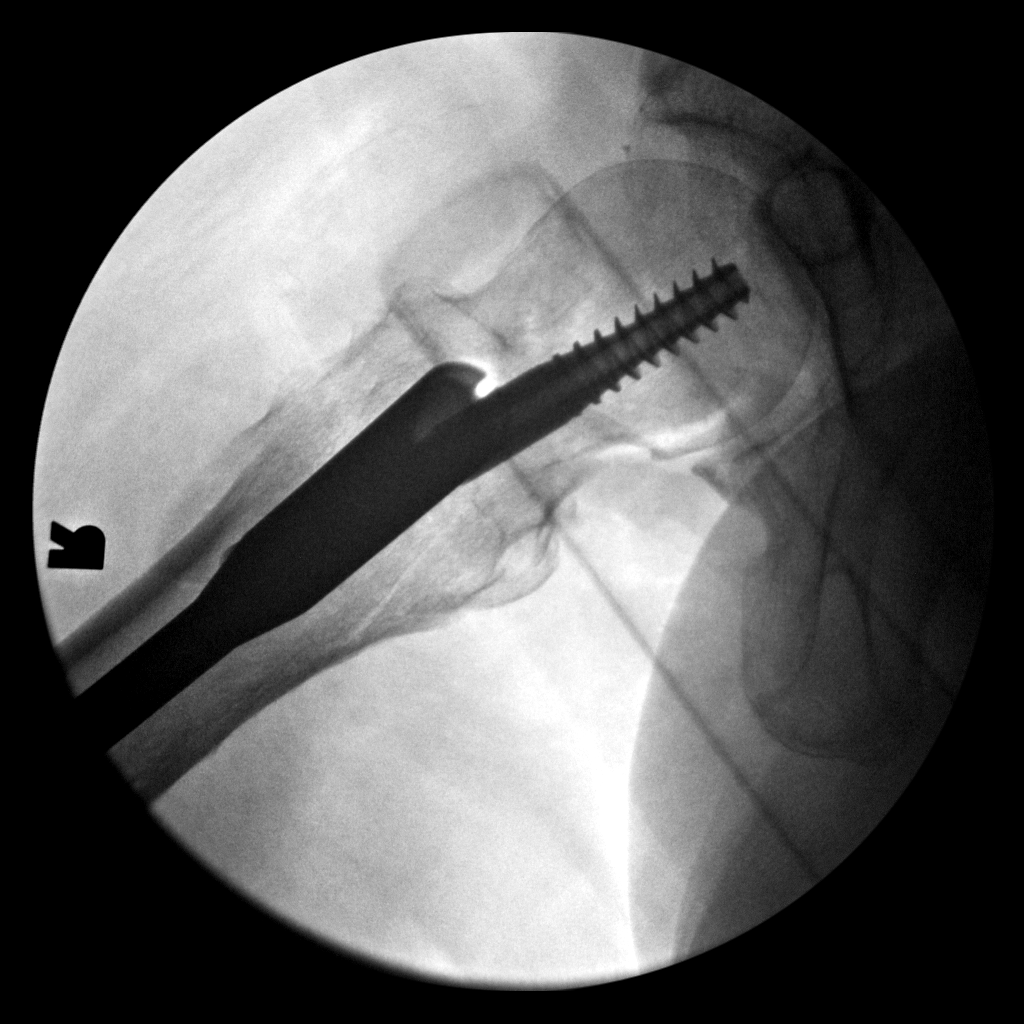
[im 4/4]
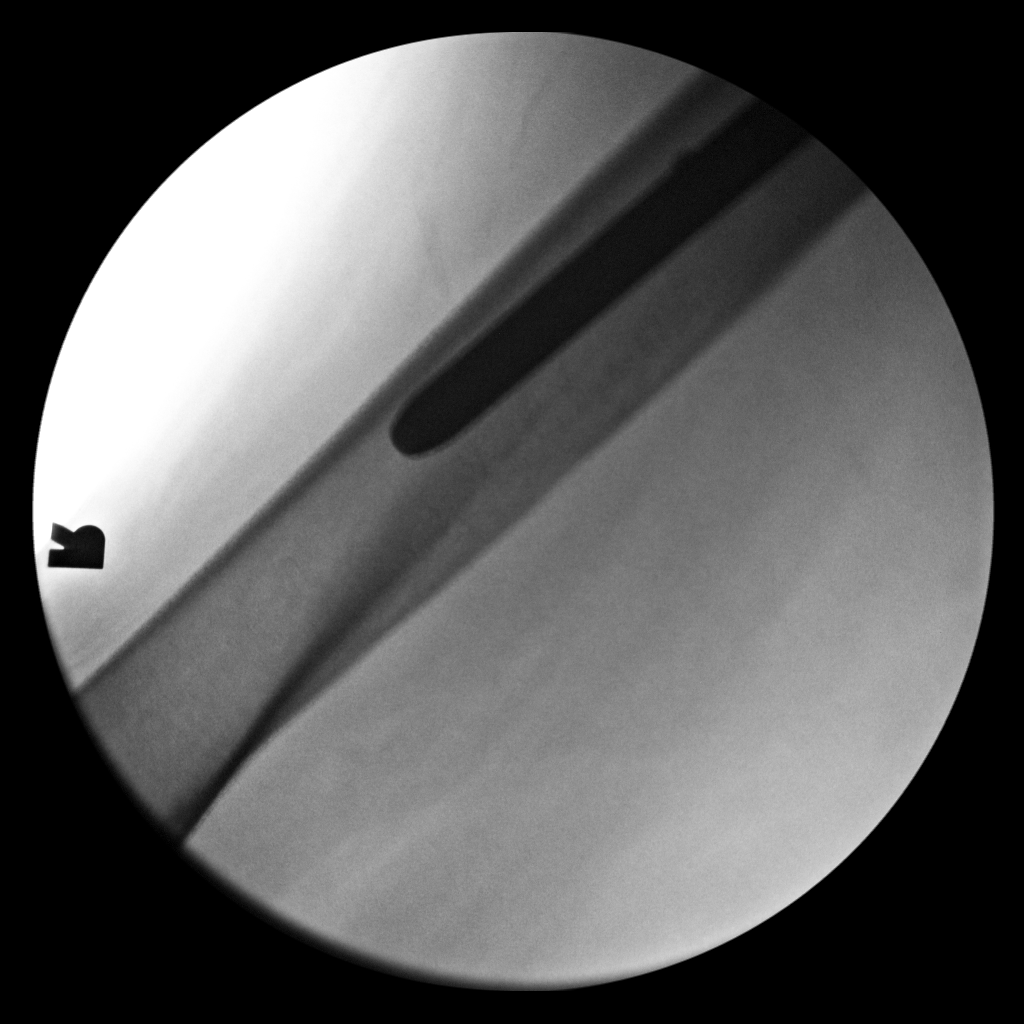

[4 of 4 positions shown; findings below may reference images not displayed]

FINDINGS: C-arm fluoroscopy was provided in the operating [HOSPITAL] seconds of
fluoroscopy time.

Four spot fluoroscopic images are submitted, demonstrating the
placement of a right femoral dynamic screw, intramedullary rod and
distal interlocking screw. The hardware appears well positioned.
There is improved alignment of the comminuted intertrochanteric
right femur fracture. No demonstrated complications.
IMPRESSION: Intraoperative views during ORIF of the right femur fracture.

## 2020-11-02 IMAGING — CT CT HEAD W/O CM
4 series · 16 of 47 positions shown, 18 images · non-contrast
Comparison: Brain MRI 08/02/2019.

CLINICAL DATA: 63-year-old female status post fall with right femur
fracture.

EXAM:
CT HEAD WITHOUT CONTRAST
TECHNIQUE: Contiguous axial images were obtained from the base of the skull
through the vertex without intravenous contrast.

[Series 3: head wo · axial · 0.40mm/px · z∈[-174,-69]mm · 6 of 31 slices shown, 8 images]
[im 5/31  brain]
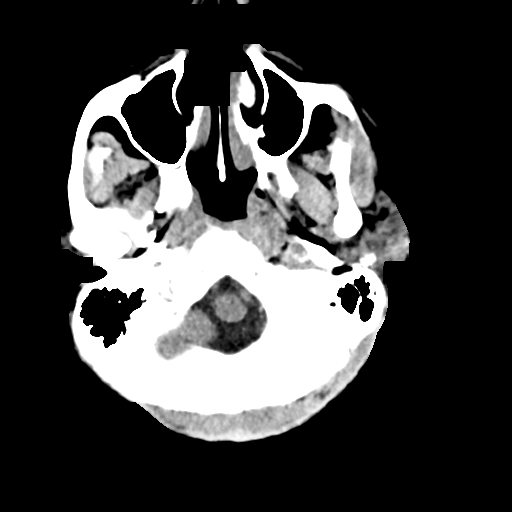
[im 5/31  bone]
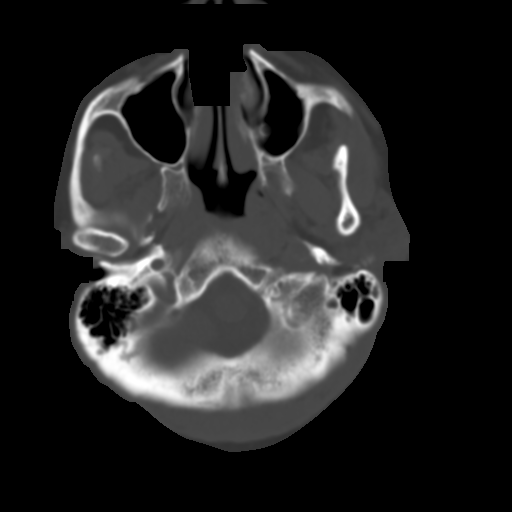
[im 9/31  brain]
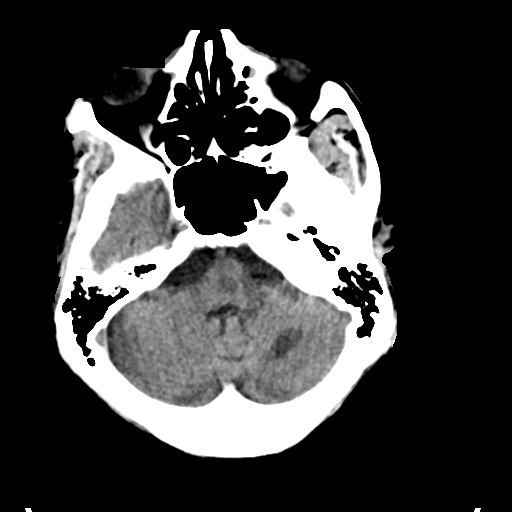
[im 13/31  brain]
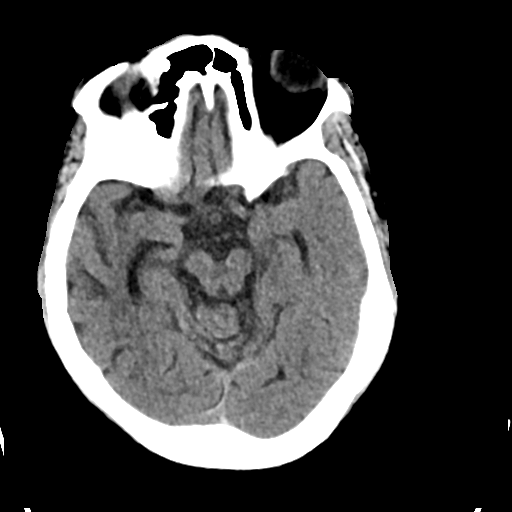
[im 18/31  brain]
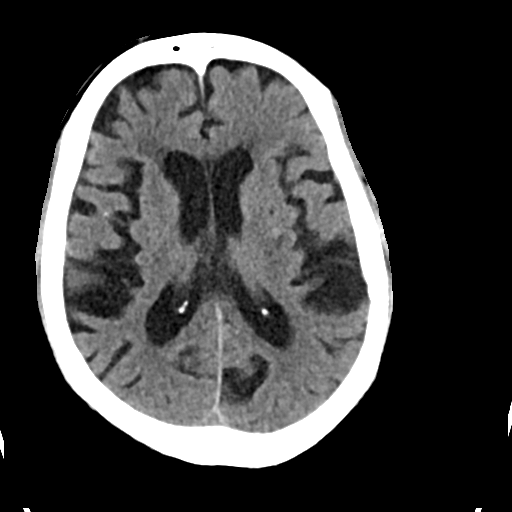
[im 22/31  brain]
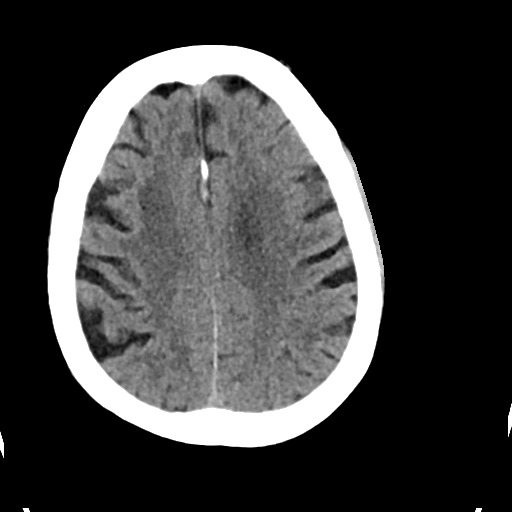
[im 22/31  bone]
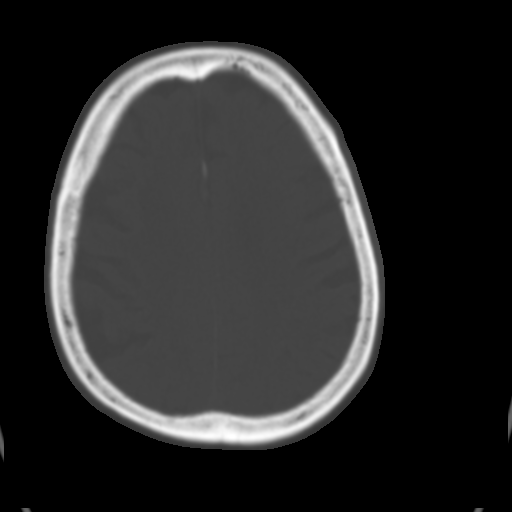
[im 26/31  brain]
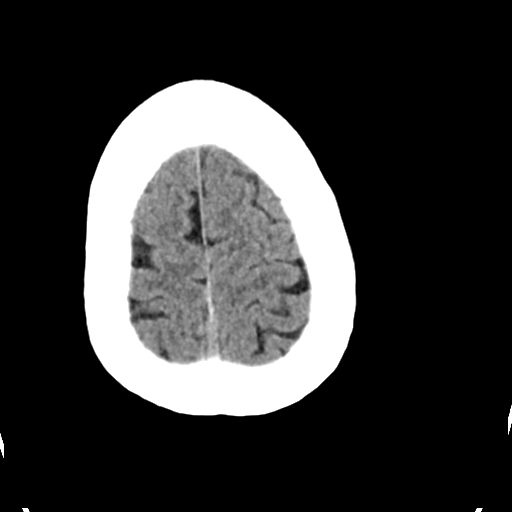

[Series 4: head bone · axial · 0.40mm/px · z∈[-180,-126]mm · 4 of 81 slices shown]
[im 8/81  bone]
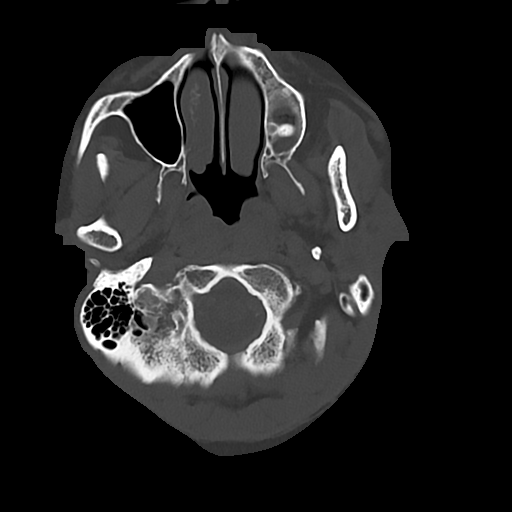
[im 16/81  bone]
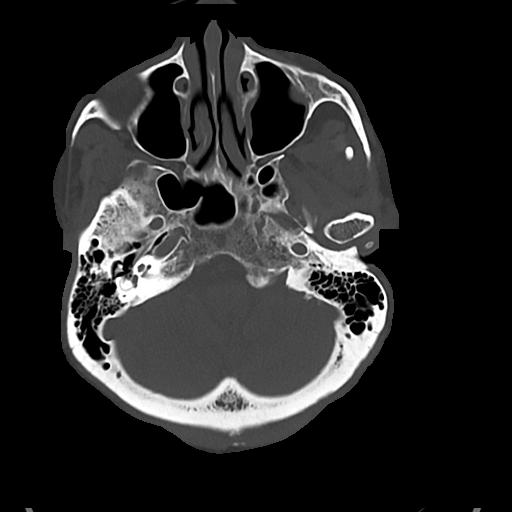
[im 27/81  bone]
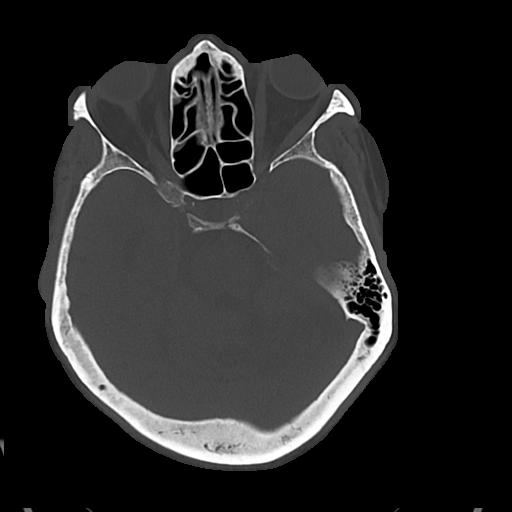
[im 35/81  bone]
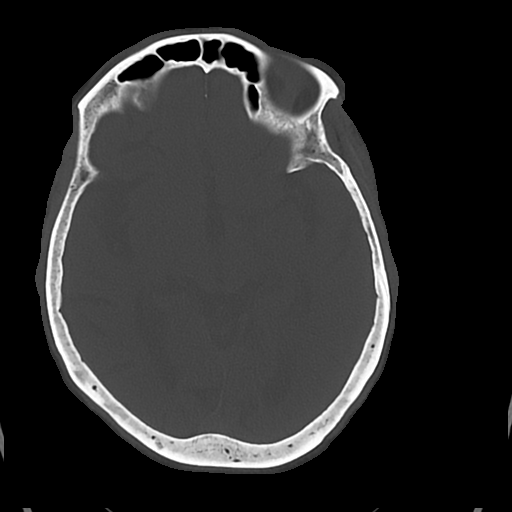

[Series 5: cor soft · coronal · 0.32mm/px · 3 of 72 slices shown]
[im 24/72  brain]
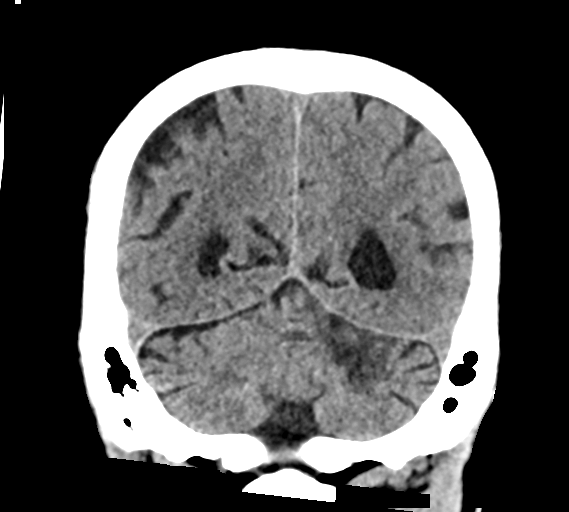
[im 32/72  brain]
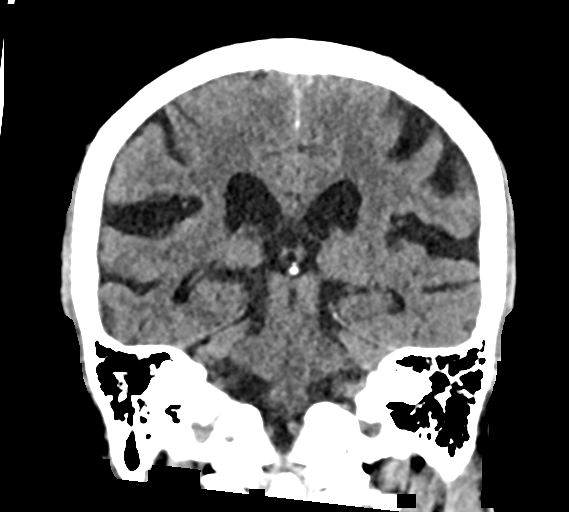
[im 40/72  brain]
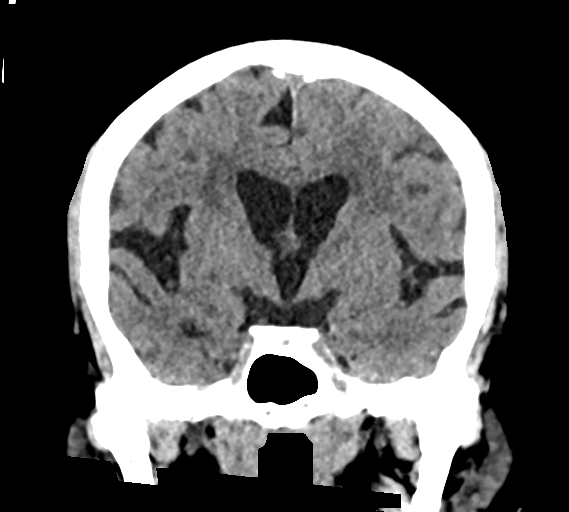

[Series 6: sag soft · sagittal · 0.35mm/px · 3 of 60 slices shown]
[im 20/60  brain]
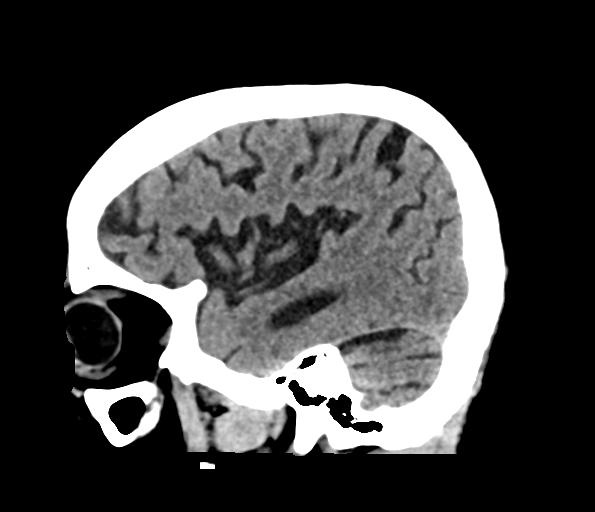
[im 30/60  brain]
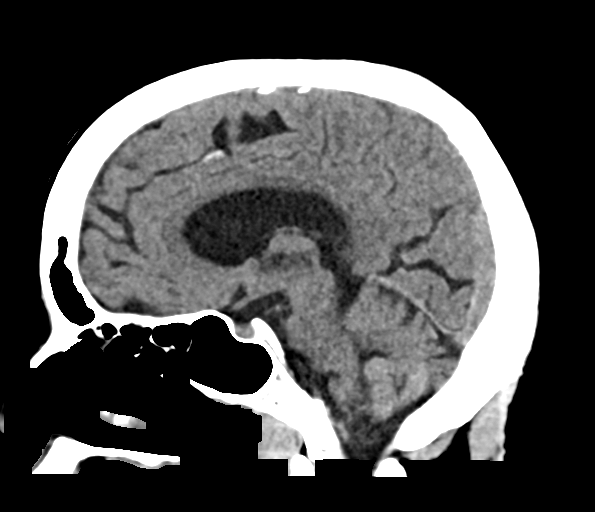
[im 40/60  brain]
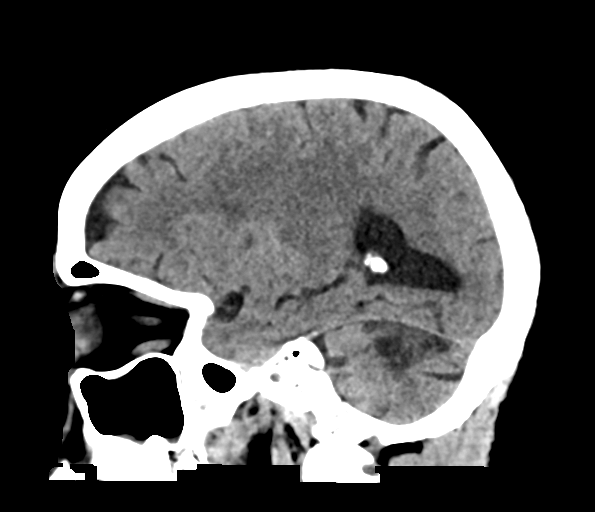

[16 of 47 positions shown; findings below may reference images not displayed]

FINDINGS: Brain: Sequelae of left cerebellar infarct seen in [REDACTED] with
encephalomalacia now. Superimposed chronic lacunar infarcts in the
deep gray nuclei including the right thalamus.

Stable cerebral volume. No midline shift, ventriculomegaly, mass
effect, evidence of mass lesion, intracranial hemorrhage or evidence
of cortically based acute infarction.

Vascular: Mild Calcified atherosclerosis at the skull base. No
suspicious intracranial vascular hyperdensity.

Skull: Negative.

Sinuses/Orbits: Mild maxillary and ethmoid sinus mucosal thickening
is new since [REDACTED]. Tympanic cavities and mastoids remain clear.

Other: No acute orbit or scalp soft tissue findings.
IMPRESSION: 1. No acute traumatic injury or acute intracranial abnormality
identified.
2. Expected evolution of the Left SCA cerebellum infarct in [REDACTED].
Underlying chronic small vessel disease.

## 2020-11-03 DIAGNOSIS — I10 Essential (primary) hypertension: Secondary | ICD-10-CM | POA: Diagnosis not present

## 2020-11-03 DIAGNOSIS — I6330 Cerebral infarction due to thrombosis of unspecified cerebral artery: Secondary | ICD-10-CM | POA: Diagnosis not present

## 2020-11-03 DIAGNOSIS — E11649 Type 2 diabetes mellitus with hypoglycemia without coma: Secondary | ICD-10-CM | POA: Diagnosis not present

## 2020-11-03 DIAGNOSIS — R1312 Dysphagia, oropharyngeal phase: Secondary | ICD-10-CM | POA: Diagnosis not present

## 2020-11-04 DIAGNOSIS — I10 Essential (primary) hypertension: Secondary | ICD-10-CM | POA: Diagnosis not present

## 2020-11-04 DIAGNOSIS — E119 Type 2 diabetes mellitus without complications: Secondary | ICD-10-CM | POA: Diagnosis not present

## 2020-11-04 DIAGNOSIS — E785 Hyperlipidemia, unspecified: Secondary | ICD-10-CM | POA: Diagnosis not present

## 2020-11-04 DIAGNOSIS — K219 Gastro-esophageal reflux disease without esophagitis: Secondary | ICD-10-CM | POA: Diagnosis not present

## 2020-11-04 DIAGNOSIS — K5900 Constipation, unspecified: Secondary | ICD-10-CM | POA: Diagnosis not present

## 2020-11-05 DIAGNOSIS — R1312 Dysphagia, oropharyngeal phase: Secondary | ICD-10-CM | POA: Diagnosis not present

## 2020-11-05 DIAGNOSIS — E11649 Type 2 diabetes mellitus with hypoglycemia without coma: Secondary | ICD-10-CM | POA: Diagnosis not present

## 2020-11-05 DIAGNOSIS — I10 Essential (primary) hypertension: Secondary | ICD-10-CM | POA: Diagnosis not present

## 2020-11-05 DIAGNOSIS — I6330 Cerebral infarction due to thrombosis of unspecified cerebral artery: Secondary | ICD-10-CM | POA: Diagnosis not present

## 2020-12-04 DIAGNOSIS — R2681 Unsteadiness on feet: Secondary | ICD-10-CM | POA: Diagnosis not present

## 2020-12-04 DIAGNOSIS — I6330 Cerebral infarction due to thrombosis of unspecified cerebral artery: Secondary | ICD-10-CM | POA: Diagnosis not present

## 2020-12-04 DIAGNOSIS — M4802 Spinal stenosis, cervical region: Secondary | ICD-10-CM | POA: Diagnosis not present

## 2020-12-04 DIAGNOSIS — M6281 Muscle weakness (generalized): Secondary | ICD-10-CM | POA: Diagnosis not present

## 2020-12-04 DIAGNOSIS — M5030 Other cervical disc degeneration, unspecified cervical region: Secondary | ICD-10-CM | POA: Diagnosis not present

## 2020-12-10 DIAGNOSIS — I6330 Cerebral infarction due to thrombosis of unspecified cerebral artery: Secondary | ICD-10-CM | POA: Diagnosis not present

## 2020-12-10 DIAGNOSIS — M5030 Other cervical disc degeneration, unspecified cervical region: Secondary | ICD-10-CM | POA: Diagnosis not present

## 2020-12-10 DIAGNOSIS — M6281 Muscle weakness (generalized): Secondary | ICD-10-CM | POA: Diagnosis not present

## 2020-12-10 DIAGNOSIS — M4802 Spinal stenosis, cervical region: Secondary | ICD-10-CM | POA: Diagnosis not present

## 2020-12-10 DIAGNOSIS — R2681 Unsteadiness on feet: Secondary | ICD-10-CM | POA: Diagnosis not present

## 2020-12-11 DIAGNOSIS — R2681 Unsteadiness on feet: Secondary | ICD-10-CM | POA: Diagnosis not present

## 2020-12-11 DIAGNOSIS — M6281 Muscle weakness (generalized): Secondary | ICD-10-CM | POA: Diagnosis not present

## 2020-12-11 DIAGNOSIS — M5030 Other cervical disc degeneration, unspecified cervical region: Secondary | ICD-10-CM | POA: Diagnosis not present

## 2020-12-11 DIAGNOSIS — E785 Hyperlipidemia, unspecified: Secondary | ICD-10-CM | POA: Diagnosis not present

## 2020-12-11 DIAGNOSIS — M4802 Spinal stenosis, cervical region: Secondary | ICD-10-CM | POA: Diagnosis not present

## 2020-12-11 DIAGNOSIS — E119 Type 2 diabetes mellitus without complications: Secondary | ICD-10-CM | POA: Diagnosis not present

## 2020-12-11 DIAGNOSIS — I10 Essential (primary) hypertension: Secondary | ICD-10-CM | POA: Diagnosis not present

## 2020-12-11 DIAGNOSIS — I6330 Cerebral infarction due to thrombosis of unspecified cerebral artery: Secondary | ICD-10-CM | POA: Diagnosis not present

## 2020-12-11 DIAGNOSIS — Z5320 Procedure and treatment not carried out because of patient's decision for unspecified reasons: Secondary | ICD-10-CM | POA: Diagnosis not present

## 2020-12-15 DIAGNOSIS — M5030 Other cervical disc degeneration, unspecified cervical region: Secondary | ICD-10-CM | POA: Diagnosis not present

## 2020-12-15 DIAGNOSIS — M4802 Spinal stenosis, cervical region: Secondary | ICD-10-CM | POA: Diagnosis not present

## 2020-12-15 DIAGNOSIS — R2681 Unsteadiness on feet: Secondary | ICD-10-CM | POA: Diagnosis not present

## 2020-12-15 DIAGNOSIS — M6281 Muscle weakness (generalized): Secondary | ICD-10-CM | POA: Diagnosis not present

## 2020-12-15 DIAGNOSIS — I6330 Cerebral infarction due to thrombosis of unspecified cerebral artery: Secondary | ICD-10-CM | POA: Diagnosis not present

## 2020-12-17 DIAGNOSIS — M4802 Spinal stenosis, cervical region: Secondary | ICD-10-CM | POA: Diagnosis not present

## 2020-12-17 DIAGNOSIS — R2681 Unsteadiness on feet: Secondary | ICD-10-CM | POA: Diagnosis not present

## 2020-12-17 DIAGNOSIS — M5030 Other cervical disc degeneration, unspecified cervical region: Secondary | ICD-10-CM | POA: Diagnosis not present

## 2020-12-17 DIAGNOSIS — M6281 Muscle weakness (generalized): Secondary | ICD-10-CM | POA: Diagnosis not present

## 2020-12-17 DIAGNOSIS — I6330 Cerebral infarction due to thrombosis of unspecified cerebral artery: Secondary | ICD-10-CM | POA: Diagnosis not present

## 2020-12-18 DIAGNOSIS — M6281 Muscle weakness (generalized): Secondary | ICD-10-CM | POA: Diagnosis not present

## 2020-12-18 DIAGNOSIS — M4802 Spinal stenosis, cervical region: Secondary | ICD-10-CM | POA: Diagnosis not present

## 2020-12-18 DIAGNOSIS — I6330 Cerebral infarction due to thrombosis of unspecified cerebral artery: Secondary | ICD-10-CM | POA: Diagnosis not present

## 2020-12-18 DIAGNOSIS — R2681 Unsteadiness on feet: Secondary | ICD-10-CM | POA: Diagnosis not present

## 2020-12-18 DIAGNOSIS — M5030 Other cervical disc degeneration, unspecified cervical region: Secondary | ICD-10-CM | POA: Diagnosis not present

## 2020-12-22 DIAGNOSIS — I6330 Cerebral infarction due to thrombosis of unspecified cerebral artery: Secondary | ICD-10-CM | POA: Diagnosis not present

## 2020-12-22 DIAGNOSIS — R2681 Unsteadiness on feet: Secondary | ICD-10-CM | POA: Diagnosis not present

## 2020-12-22 DIAGNOSIS — M6281 Muscle weakness (generalized): Secondary | ICD-10-CM | POA: Diagnosis not present

## 2020-12-22 DIAGNOSIS — M4802 Spinal stenosis, cervical region: Secondary | ICD-10-CM | POA: Diagnosis not present

## 2020-12-22 DIAGNOSIS — M5030 Other cervical disc degeneration, unspecified cervical region: Secondary | ICD-10-CM | POA: Diagnosis not present

## 2020-12-23 DIAGNOSIS — I6330 Cerebral infarction due to thrombosis of unspecified cerebral artery: Secondary | ICD-10-CM | POA: Diagnosis not present

## 2020-12-23 DIAGNOSIS — M5030 Other cervical disc degeneration, unspecified cervical region: Secondary | ICD-10-CM | POA: Diagnosis not present

## 2020-12-23 DIAGNOSIS — M4802 Spinal stenosis, cervical region: Secondary | ICD-10-CM | POA: Diagnosis not present

## 2020-12-23 DIAGNOSIS — M6281 Muscle weakness (generalized): Secondary | ICD-10-CM | POA: Diagnosis not present

## 2020-12-23 DIAGNOSIS — R2681 Unsteadiness on feet: Secondary | ICD-10-CM | POA: Diagnosis not present

## 2020-12-25 DIAGNOSIS — M4802 Spinal stenosis, cervical region: Secondary | ICD-10-CM | POA: Diagnosis not present

## 2020-12-25 DIAGNOSIS — M5030 Other cervical disc degeneration, unspecified cervical region: Secondary | ICD-10-CM | POA: Diagnosis not present

## 2020-12-25 DIAGNOSIS — R2681 Unsteadiness on feet: Secondary | ICD-10-CM | POA: Diagnosis not present

## 2020-12-25 DIAGNOSIS — I6330 Cerebral infarction due to thrombosis of unspecified cerebral artery: Secondary | ICD-10-CM | POA: Diagnosis not present

## 2020-12-25 DIAGNOSIS — M6281 Muscle weakness (generalized): Secondary | ICD-10-CM | POA: Diagnosis not present

## 2020-12-27 DIAGNOSIS — M4802 Spinal stenosis, cervical region: Secondary | ICD-10-CM | POA: Diagnosis not present

## 2020-12-27 DIAGNOSIS — R2681 Unsteadiness on feet: Secondary | ICD-10-CM | POA: Diagnosis not present

## 2020-12-27 DIAGNOSIS — M6281 Muscle weakness (generalized): Secondary | ICD-10-CM | POA: Diagnosis not present

## 2020-12-27 DIAGNOSIS — I6330 Cerebral infarction due to thrombosis of unspecified cerebral artery: Secondary | ICD-10-CM | POA: Diagnosis not present

## 2020-12-27 DIAGNOSIS — M5030 Other cervical disc degeneration, unspecified cervical region: Secondary | ICD-10-CM | POA: Diagnosis not present

## 2020-12-29 DIAGNOSIS — E785 Hyperlipidemia, unspecified: Secondary | ICD-10-CM | POA: Diagnosis not present

## 2020-12-29 DIAGNOSIS — I10 Essential (primary) hypertension: Secondary | ICD-10-CM | POA: Diagnosis not present

## 2020-12-29 DIAGNOSIS — K5900 Constipation, unspecified: Secondary | ICD-10-CM | POA: Diagnosis not present

## 2020-12-31 DIAGNOSIS — M6281 Muscle weakness (generalized): Secondary | ICD-10-CM | POA: Diagnosis not present

## 2020-12-31 DIAGNOSIS — I6330 Cerebral infarction due to thrombosis of unspecified cerebral artery: Secondary | ICD-10-CM | POA: Diagnosis not present

## 2020-12-31 DIAGNOSIS — R2681 Unsteadiness on feet: Secondary | ICD-10-CM | POA: Diagnosis not present

## 2020-12-31 DIAGNOSIS — M4802 Spinal stenosis, cervical region: Secondary | ICD-10-CM | POA: Diagnosis not present

## 2020-12-31 DIAGNOSIS — M5030 Other cervical disc degeneration, unspecified cervical region: Secondary | ICD-10-CM | POA: Diagnosis not present

## 2021-01-01 DIAGNOSIS — M5030 Other cervical disc degeneration, unspecified cervical region: Secondary | ICD-10-CM | POA: Diagnosis not present

## 2021-01-01 DIAGNOSIS — I6330 Cerebral infarction due to thrombosis of unspecified cerebral artery: Secondary | ICD-10-CM | POA: Diagnosis not present

## 2021-01-01 DIAGNOSIS — M4802 Spinal stenosis, cervical region: Secondary | ICD-10-CM | POA: Diagnosis not present

## 2021-01-01 DIAGNOSIS — M6281 Muscle weakness (generalized): Secondary | ICD-10-CM | POA: Diagnosis not present

## 2021-01-01 DIAGNOSIS — R2681 Unsteadiness on feet: Secondary | ICD-10-CM | POA: Diagnosis not present

## 2021-01-06 DIAGNOSIS — R2681 Unsteadiness on feet: Secondary | ICD-10-CM | POA: Diagnosis not present

## 2021-01-06 DIAGNOSIS — M4802 Spinal stenosis, cervical region: Secondary | ICD-10-CM | POA: Diagnosis not present

## 2021-01-06 DIAGNOSIS — M6281 Muscle weakness (generalized): Secondary | ICD-10-CM | POA: Diagnosis not present

## 2021-01-06 DIAGNOSIS — M5030 Other cervical disc degeneration, unspecified cervical region: Secondary | ICD-10-CM | POA: Diagnosis not present

## 2021-01-06 DIAGNOSIS — I6330 Cerebral infarction due to thrombosis of unspecified cerebral artery: Secondary | ICD-10-CM | POA: Diagnosis not present

## 2021-01-07 DIAGNOSIS — M5030 Other cervical disc degeneration, unspecified cervical region: Secondary | ICD-10-CM | POA: Diagnosis not present

## 2021-01-07 DIAGNOSIS — M6281 Muscle weakness (generalized): Secondary | ICD-10-CM | POA: Diagnosis not present

## 2021-01-07 DIAGNOSIS — I6330 Cerebral infarction due to thrombosis of unspecified cerebral artery: Secondary | ICD-10-CM | POA: Diagnosis not present

## 2021-01-07 DIAGNOSIS — M4802 Spinal stenosis, cervical region: Secondary | ICD-10-CM | POA: Diagnosis not present

## 2021-01-07 DIAGNOSIS — R2681 Unsteadiness on feet: Secondary | ICD-10-CM | POA: Diagnosis not present

## 2021-01-08 DIAGNOSIS — M6281 Muscle weakness (generalized): Secondary | ICD-10-CM | POA: Diagnosis not present

## 2021-01-08 DIAGNOSIS — M5030 Other cervical disc degeneration, unspecified cervical region: Secondary | ICD-10-CM | POA: Diagnosis not present

## 2021-01-08 DIAGNOSIS — R2681 Unsteadiness on feet: Secondary | ICD-10-CM | POA: Diagnosis not present

## 2021-01-08 DIAGNOSIS — I6330 Cerebral infarction due to thrombosis of unspecified cerebral artery: Secondary | ICD-10-CM | POA: Diagnosis not present

## 2021-01-08 DIAGNOSIS — M4802 Spinal stenosis, cervical region: Secondary | ICD-10-CM | POA: Diagnosis not present

## 2021-01-13 DIAGNOSIS — I6330 Cerebral infarction due to thrombosis of unspecified cerebral artery: Secondary | ICD-10-CM | POA: Diagnosis not present

## 2021-01-13 DIAGNOSIS — M5030 Other cervical disc degeneration, unspecified cervical region: Secondary | ICD-10-CM | POA: Diagnosis not present

## 2021-01-13 DIAGNOSIS — M4802 Spinal stenosis, cervical region: Secondary | ICD-10-CM | POA: Diagnosis not present

## 2021-01-13 DIAGNOSIS — R2681 Unsteadiness on feet: Secondary | ICD-10-CM | POA: Diagnosis not present

## 2021-01-13 DIAGNOSIS — M6281 Muscle weakness (generalized): Secondary | ICD-10-CM | POA: Diagnosis not present

## 2021-01-14 DIAGNOSIS — M4802 Spinal stenosis, cervical region: Secondary | ICD-10-CM | POA: Diagnosis not present

## 2021-01-14 DIAGNOSIS — R2681 Unsteadiness on feet: Secondary | ICD-10-CM | POA: Diagnosis not present

## 2021-01-14 DIAGNOSIS — M5030 Other cervical disc degeneration, unspecified cervical region: Secondary | ICD-10-CM | POA: Diagnosis not present

## 2021-01-14 DIAGNOSIS — M6281 Muscle weakness (generalized): Secondary | ICD-10-CM | POA: Diagnosis not present

## 2021-01-14 DIAGNOSIS — I6330 Cerebral infarction due to thrombosis of unspecified cerebral artery: Secondary | ICD-10-CM | POA: Diagnosis not present

## 2021-01-15 DIAGNOSIS — R2681 Unsteadiness on feet: Secondary | ICD-10-CM | POA: Diagnosis not present

## 2021-01-15 DIAGNOSIS — M4802 Spinal stenosis, cervical region: Secondary | ICD-10-CM | POA: Diagnosis not present

## 2021-01-15 DIAGNOSIS — I6330 Cerebral infarction due to thrombosis of unspecified cerebral artery: Secondary | ICD-10-CM | POA: Diagnosis not present

## 2021-01-15 DIAGNOSIS — M5030 Other cervical disc degeneration, unspecified cervical region: Secondary | ICD-10-CM | POA: Diagnosis not present

## 2021-01-15 DIAGNOSIS — M6281 Muscle weakness (generalized): Secondary | ICD-10-CM | POA: Diagnosis not present

## 2021-01-18 ENCOUNTER — Other Ambulatory Visit: Payer: Self-pay | Admitting: *Deleted

## 2021-01-18 NOTE — Patient Outreach (Signed)
[facility]) Care Management  01/18/2021  Analeise Mccleery 03/19/1956 [id number]   Referral Date: 3/21 Referral Source: Insurance Referral Reason: High Risk Patient Insurance: [facility]   Outreach attempt #1, unsuccessful, HIPAA compliant voice message left.   Plan: RN CM will send outreach letter and follow up within the next 3-4 business days.  Kemper Durie, [place], MSN [facility] Care Management  [facility] Manager [phone]

## 2021-01-20 DIAGNOSIS — R2681 Unsteadiness on feet: Secondary | ICD-10-CM | POA: Diagnosis not present

## 2021-01-20 DIAGNOSIS — M6281 Muscle weakness (generalized): Secondary | ICD-10-CM | POA: Diagnosis not present

## 2021-01-20 DIAGNOSIS — M5030 Other cervical disc degeneration, unspecified cervical region: Secondary | ICD-10-CM | POA: Diagnosis not present

## 2021-01-20 DIAGNOSIS — I6330 Cerebral infarction due to thrombosis of unspecified cerebral artery: Secondary | ICD-10-CM | POA: Diagnosis not present

## 2021-01-20 DIAGNOSIS — M4802 Spinal stenosis, cervical region: Secondary | ICD-10-CM | POA: Diagnosis not present

## 2021-01-21 ENCOUNTER — Other Ambulatory Visit: Payer: Self-pay | Admitting: *Deleted

## 2021-01-21 DIAGNOSIS — M5030 Other cervical disc degeneration, unspecified cervical region: Secondary | ICD-10-CM | POA: Diagnosis not present

## 2021-01-21 DIAGNOSIS — I6330 Cerebral infarction due to thrombosis of unspecified cerebral artery: Secondary | ICD-10-CM | POA: Diagnosis not present

## 2021-01-21 DIAGNOSIS — R2681 Unsteadiness on feet: Secondary | ICD-10-CM | POA: Diagnosis not present

## 2021-01-21 DIAGNOSIS — M4802 Spinal stenosis, cervical region: Secondary | ICD-10-CM | POA: Diagnosis not present

## 2021-01-21 DIAGNOSIS — M6281 Muscle weakness (generalized): Secondary | ICD-10-CM | POA: Diagnosis not present

## 2021-01-21 NOTE — Patient Outreach (Signed)
[facility]) Care Management  01/21/2021  Jilleen Essner [birth year] [id number]   Referral Date: 3/21 Referral Source: Insurance Referral Reason: High Risk Patient Insurance: [facility]   Outreach attempt #2, unsuccessful, HIPAA compliant voice message left.   Plan: RN CM will follow up within the next 3-4 business days.  Kemper Durie, [place], MSN [facility] Care Management  [facility] Manager [phone]

## 2021-01-22 DIAGNOSIS — R2681 Unsteadiness on feet: Secondary | ICD-10-CM | POA: Diagnosis not present

## 2021-01-22 DIAGNOSIS — I6330 Cerebral infarction due to thrombosis of unspecified cerebral artery: Secondary | ICD-10-CM | POA: Diagnosis not present

## 2021-01-22 DIAGNOSIS — M4802 Spinal stenosis, cervical region: Secondary | ICD-10-CM | POA: Diagnosis not present

## 2021-01-22 DIAGNOSIS — M5030 Other cervical disc degeneration, unspecified cervical region: Secondary | ICD-10-CM | POA: Diagnosis not present

## 2021-01-22 DIAGNOSIS — M6281 Muscle weakness (generalized): Secondary | ICD-10-CM | POA: Diagnosis not present

## 2021-01-24 DIAGNOSIS — M4802 Spinal stenosis, cervical region: Secondary | ICD-10-CM | POA: Diagnosis not present

## 2021-01-24 DIAGNOSIS — M5030 Other cervical disc degeneration, unspecified cervical region: Secondary | ICD-10-CM | POA: Diagnosis not present

## 2021-01-24 DIAGNOSIS — R2681 Unsteadiness on feet: Secondary | ICD-10-CM | POA: Diagnosis not present

## 2021-01-24 DIAGNOSIS — M6281 Muscle weakness (generalized): Secondary | ICD-10-CM | POA: Diagnosis not present

## 2021-01-24 DIAGNOSIS — I6330 Cerebral infarction due to thrombosis of unspecified cerebral artery: Secondary | ICD-10-CM | POA: Diagnosis not present

## 2021-01-27 ENCOUNTER — Other Ambulatory Visit: Payer: Self-pay | Admitting: *Deleted

## 2021-01-27 DIAGNOSIS — M6281 Muscle weakness (generalized): Secondary | ICD-10-CM | POA: Diagnosis not present

## 2021-01-27 DIAGNOSIS — I6330 Cerebral infarction due to thrombosis of unspecified cerebral artery: Secondary | ICD-10-CM | POA: Diagnosis not present

## 2021-01-27 DIAGNOSIS — R2681 Unsteadiness on feet: Secondary | ICD-10-CM | POA: Diagnosis not present

## 2021-01-27 DIAGNOSIS — M4802 Spinal stenosis, cervical region: Secondary | ICD-10-CM | POA: Diagnosis not present

## 2021-01-27 DIAGNOSIS — M5030 Other cervical disc degeneration, unspecified cervical region: Secondary | ICD-10-CM | POA: Diagnosis not present

## 2021-01-27 NOTE — Patient Outreach (Signed)
[facility]) Care Management  01/27/2021  Lauren Waller 01/21/1956 [id number]   Referral Date:3/21 Referral Source:Insurance Referral Reason:High Risk Patient Insurance:[employer]   Outreach attempt #3, unsuccessful, HIPAA compliant voice message left.  Plan: RN CM will follow up with 4th and final attempt within the next 3 weeks.  If remain unsuccessful, will close case due to inability to establish contact.  Kemper Durie, [place], MSN [facility] Care Management  [facility] Manager [phone]

## 2021-01-29 DIAGNOSIS — I6330 Cerebral infarction due to thrombosis of unspecified cerebral artery: Secondary | ICD-10-CM | POA: Diagnosis not present

## 2021-01-29 DIAGNOSIS — M6281 Muscle weakness (generalized): Secondary | ICD-10-CM | POA: Diagnosis not present

## 2021-01-29 DIAGNOSIS — M5030 Other cervical disc degeneration, unspecified cervical region: Secondary | ICD-10-CM | POA: Diagnosis not present

## 2021-01-29 DIAGNOSIS — M4802 Spinal stenosis, cervical region: Secondary | ICD-10-CM | POA: Diagnosis not present

## 2021-01-29 DIAGNOSIS — R2681 Unsteadiness on feet: Secondary | ICD-10-CM | POA: Diagnosis not present

## 2021-02-03 DIAGNOSIS — M4802 Spinal stenosis, cervical region: Secondary | ICD-10-CM | POA: Diagnosis not present

## 2021-02-03 DIAGNOSIS — M6281 Muscle weakness (generalized): Secondary | ICD-10-CM | POA: Diagnosis not present

## 2021-02-03 DIAGNOSIS — R2681 Unsteadiness on feet: Secondary | ICD-10-CM | POA: Diagnosis not present

## 2021-02-03 DIAGNOSIS — I6330 Cerebral infarction due to thrombosis of unspecified cerebral artery: Secondary | ICD-10-CM | POA: Diagnosis not present

## 2021-02-03 DIAGNOSIS — M5030 Other cervical disc degeneration, unspecified cervical region: Secondary | ICD-10-CM | POA: Diagnosis not present

## 2021-02-05 DIAGNOSIS — R2681 Unsteadiness on feet: Secondary | ICD-10-CM | POA: Diagnosis not present

## 2021-02-05 DIAGNOSIS — M6281 Muscle weakness (generalized): Secondary | ICD-10-CM | POA: Diagnosis not present

## 2021-02-05 DIAGNOSIS — I6330 Cerebral infarction due to thrombosis of unspecified cerebral artery: Secondary | ICD-10-CM | POA: Diagnosis not present

## 2021-02-05 DIAGNOSIS — M4802 Spinal stenosis, cervical region: Secondary | ICD-10-CM | POA: Diagnosis not present

## 2021-02-05 DIAGNOSIS — M5030 Other cervical disc degeneration, unspecified cervical region: Secondary | ICD-10-CM | POA: Diagnosis not present

## 2021-02-06 DIAGNOSIS — I6330 Cerebral infarction due to thrombosis of unspecified cerebral artery: Secondary | ICD-10-CM | POA: Diagnosis not present

## 2021-02-06 DIAGNOSIS — M6281 Muscle weakness (generalized): Secondary | ICD-10-CM | POA: Diagnosis not present

## 2021-02-06 DIAGNOSIS — R2681 Unsteadiness on feet: Secondary | ICD-10-CM | POA: Diagnosis not present

## 2021-02-06 DIAGNOSIS — M5030 Other cervical disc degeneration, unspecified cervical region: Secondary | ICD-10-CM | POA: Diagnosis not present

## 2021-02-06 DIAGNOSIS — M4802 Spinal stenosis, cervical region: Secondary | ICD-10-CM | POA: Diagnosis not present

## 2021-02-08 DIAGNOSIS — M5030 Other cervical disc degeneration, unspecified cervical region: Secondary | ICD-10-CM | POA: Diagnosis not present

## 2021-02-08 DIAGNOSIS — M6281 Muscle weakness (generalized): Secondary | ICD-10-CM | POA: Diagnosis not present

## 2021-02-08 DIAGNOSIS — I6330 Cerebral infarction due to thrombosis of unspecified cerebral artery: Secondary | ICD-10-CM | POA: Diagnosis not present

## 2021-02-08 DIAGNOSIS — M4802 Spinal stenosis, cervical region: Secondary | ICD-10-CM | POA: Diagnosis not present

## 2021-02-08 DIAGNOSIS — R2681 Unsteadiness on feet: Secondary | ICD-10-CM | POA: Diagnosis not present

## 2021-02-10 DIAGNOSIS — M5030 Other cervical disc degeneration, unspecified cervical region: Secondary | ICD-10-CM | POA: Diagnosis not present

## 2021-02-10 DIAGNOSIS — M4802 Spinal stenosis, cervical region: Secondary | ICD-10-CM | POA: Diagnosis not present

## 2021-02-10 DIAGNOSIS — I6330 Cerebral infarction due to thrombosis of unspecified cerebral artery: Secondary | ICD-10-CM | POA: Diagnosis not present

## 2021-02-10 DIAGNOSIS — M6281 Muscle weakness (generalized): Secondary | ICD-10-CM | POA: Diagnosis not present

## 2021-02-10 DIAGNOSIS — R2681 Unsteadiness on feet: Secondary | ICD-10-CM | POA: Diagnosis not present

## 2021-02-12 DIAGNOSIS — I6330 Cerebral infarction due to thrombosis of unspecified cerebral artery: Secondary | ICD-10-CM | POA: Diagnosis not present

## 2021-02-12 DIAGNOSIS — M6281 Muscle weakness (generalized): Secondary | ICD-10-CM | POA: Diagnosis not present

## 2021-02-12 DIAGNOSIS — M4802 Spinal stenosis, cervical region: Secondary | ICD-10-CM | POA: Diagnosis not present

## 2021-02-12 DIAGNOSIS — R2681 Unsteadiness on feet: Secondary | ICD-10-CM | POA: Diagnosis not present

## 2021-02-12 DIAGNOSIS — M5030 Other cervical disc degeneration, unspecified cervical region: Secondary | ICD-10-CM | POA: Diagnosis not present

## 2021-02-15 DIAGNOSIS — M5030 Other cervical disc degeneration, unspecified cervical region: Secondary | ICD-10-CM | POA: Diagnosis not present

## 2021-02-15 DIAGNOSIS — R2681 Unsteadiness on feet: Secondary | ICD-10-CM | POA: Diagnosis not present

## 2021-02-15 DIAGNOSIS — I6330 Cerebral infarction due to thrombosis of unspecified cerebral artery: Secondary | ICD-10-CM | POA: Diagnosis not present

## 2021-02-15 DIAGNOSIS — M6281 Muscle weakness (generalized): Secondary | ICD-10-CM | POA: Diagnosis not present

## 2021-02-15 DIAGNOSIS — M4802 Spinal stenosis, cervical region: Secondary | ICD-10-CM | POA: Diagnosis not present

## 2021-02-17 ENCOUNTER — Other Ambulatory Visit: Payer: Self-pay | Admitting: *Deleted

## 2021-02-17 DIAGNOSIS — I6330 Cerebral infarction due to thrombosis of unspecified cerebral artery: Secondary | ICD-10-CM | POA: Diagnosis not present

## 2021-02-17 DIAGNOSIS — M6281 Muscle weakness (generalized): Secondary | ICD-10-CM | POA: Diagnosis not present

## 2021-02-17 DIAGNOSIS — M5030 Other cervical disc degeneration, unspecified cervical region: Secondary | ICD-10-CM | POA: Diagnosis not present

## 2021-02-17 DIAGNOSIS — M4802 Spinal stenosis, cervical region: Secondary | ICD-10-CM | POA: Diagnosis not present

## 2021-02-17 DIAGNOSIS — R2681 Unsteadiness on feet: Secondary | ICD-10-CM | POA: Diagnosis not present

## 2021-02-17 NOTE — Patient Outreach (Signed)
[facility]) Care Management  02/17/2021  Yaniah Thiemann [birth year] [id number]    Referral Date:3/21 Referral Source:Insurance Referral Reason:High Risk Patient Insurance:[employer]   Outreach attempt #4, unsuccessful, HIPAA compliant voice message left.  No response from member after multiple unsuccessful outreach attempts and letter sent.  Will close case at this time due to inability to maintain contact.  Will notify member and primary MD of case closure.   Kemper Durie, [place], MSN [facility] Care Management  [facility] Manager [phone]

## 2021-02-19 DIAGNOSIS — M5030 Other cervical disc degeneration, unspecified cervical region: Secondary | ICD-10-CM | POA: Diagnosis not present

## 2021-02-19 DIAGNOSIS — I6330 Cerebral infarction due to thrombosis of unspecified cerebral artery: Secondary | ICD-10-CM | POA: Diagnosis not present

## 2021-02-19 DIAGNOSIS — M6281 Muscle weakness (generalized): Secondary | ICD-10-CM | POA: Diagnosis not present

## 2021-02-19 DIAGNOSIS — R2681 Unsteadiness on feet: Secondary | ICD-10-CM | POA: Diagnosis not present

## 2021-02-19 DIAGNOSIS — M4802 Spinal stenosis, cervical region: Secondary | ICD-10-CM | POA: Diagnosis not present

## 2021-02-22 DIAGNOSIS — I6330 Cerebral infarction due to thrombosis of unspecified cerebral artery: Secondary | ICD-10-CM | POA: Diagnosis not present

## 2021-02-22 DIAGNOSIS — M4800 Spinal stenosis, site unspecified: Secondary | ICD-10-CM | POA: Diagnosis not present

## 2021-02-22 DIAGNOSIS — M4802 Spinal stenosis, cervical region: Secondary | ICD-10-CM | POA: Diagnosis not present

## 2021-02-22 DIAGNOSIS — E119 Type 2 diabetes mellitus without complications: Secondary | ICD-10-CM | POA: Diagnosis not present

## 2021-02-22 DIAGNOSIS — K5900 Constipation, unspecified: Secondary | ICD-10-CM | POA: Diagnosis not present

## 2021-02-22 DIAGNOSIS — M6281 Muscle weakness (generalized): Secondary | ICD-10-CM | POA: Diagnosis not present

## 2021-02-22 DIAGNOSIS — Z79899 Other long term (current) drug therapy: Secondary | ICD-10-CM | POA: Diagnosis not present

## 2021-02-22 DIAGNOSIS — I10 Essential (primary) hypertension: Secondary | ICD-10-CM | POA: Diagnosis not present

## 2021-02-22 DIAGNOSIS — E785 Hyperlipidemia, unspecified: Secondary | ICD-10-CM | POA: Diagnosis not present

## 2021-02-22 DIAGNOSIS — K219 Gastro-esophageal reflux disease without esophagitis: Secondary | ICD-10-CM | POA: Diagnosis not present

## 2021-02-22 DIAGNOSIS — R2681 Unsteadiness on feet: Secondary | ICD-10-CM | POA: Diagnosis not present

## 2021-02-22 DIAGNOSIS — M5030 Other cervical disc degeneration, unspecified cervical region: Secondary | ICD-10-CM | POA: Diagnosis not present

## 2021-02-24 DIAGNOSIS — M6281 Muscle weakness (generalized): Secondary | ICD-10-CM | POA: Diagnosis not present

## 2021-02-24 DIAGNOSIS — I6330 Cerebral infarction due to thrombosis of unspecified cerebral artery: Secondary | ICD-10-CM | POA: Diagnosis not present

## 2021-02-24 DIAGNOSIS — M5030 Other cervical disc degeneration, unspecified cervical region: Secondary | ICD-10-CM | POA: Diagnosis not present

## 2021-02-24 DIAGNOSIS — R2681 Unsteadiness on feet: Secondary | ICD-10-CM | POA: Diagnosis not present

## 2021-02-24 DIAGNOSIS — M4802 Spinal stenosis, cervical region: Secondary | ICD-10-CM | POA: Diagnosis not present

## 2021-02-26 DIAGNOSIS — M4802 Spinal stenosis, cervical region: Secondary | ICD-10-CM | POA: Diagnosis not present

## 2021-02-26 DIAGNOSIS — M5030 Other cervical disc degeneration, unspecified cervical region: Secondary | ICD-10-CM | POA: Diagnosis not present

## 2021-02-26 DIAGNOSIS — M6281 Muscle weakness (generalized): Secondary | ICD-10-CM | POA: Diagnosis not present

## 2021-02-26 DIAGNOSIS — I6330 Cerebral infarction due to thrombosis of unspecified cerebral artery: Secondary | ICD-10-CM | POA: Diagnosis not present

## 2021-02-26 DIAGNOSIS — R2681 Unsteadiness on feet: Secondary | ICD-10-CM | POA: Diagnosis not present

## 2021-02-27 DIAGNOSIS — I6330 Cerebral infarction due to thrombosis of unspecified cerebral artery: Secondary | ICD-10-CM | POA: Diagnosis not present

## 2021-02-27 DIAGNOSIS — M6281 Muscle weakness (generalized): Secondary | ICD-10-CM | POA: Diagnosis not present

## 2021-02-27 DIAGNOSIS — M4802 Spinal stenosis, cervical region: Secondary | ICD-10-CM | POA: Diagnosis not present

## 2021-02-27 DIAGNOSIS — R2681 Unsteadiness on feet: Secondary | ICD-10-CM | POA: Diagnosis not present

## 2021-02-27 DIAGNOSIS — M5030 Other cervical disc degeneration, unspecified cervical region: Secondary | ICD-10-CM | POA: Diagnosis not present

## 2021-02-28 DIAGNOSIS — M5030 Other cervical disc degeneration, unspecified cervical region: Secondary | ICD-10-CM | POA: Diagnosis not present

## 2021-02-28 DIAGNOSIS — M4802 Spinal stenosis, cervical region: Secondary | ICD-10-CM | POA: Diagnosis not present

## 2021-02-28 DIAGNOSIS — R2681 Unsteadiness on feet: Secondary | ICD-10-CM | POA: Diagnosis not present

## 2021-02-28 DIAGNOSIS — M6281 Muscle weakness (generalized): Secondary | ICD-10-CM | POA: Diagnosis not present

## 2021-02-28 DIAGNOSIS — I6330 Cerebral infarction due to thrombosis of unspecified cerebral artery: Secondary | ICD-10-CM | POA: Diagnosis not present

## 2021-03-01 DIAGNOSIS — R2681 Unsteadiness on feet: Secondary | ICD-10-CM | POA: Diagnosis not present

## 2021-03-01 DIAGNOSIS — I6330 Cerebral infarction due to thrombosis of unspecified cerebral artery: Secondary | ICD-10-CM | POA: Diagnosis not present

## 2021-03-01 DIAGNOSIS — M4802 Spinal stenosis, cervical region: Secondary | ICD-10-CM | POA: Diagnosis not present

## 2021-03-01 DIAGNOSIS — M5030 Other cervical disc degeneration, unspecified cervical region: Secondary | ICD-10-CM | POA: Diagnosis not present

## 2021-03-01 DIAGNOSIS — M6281 Muscle weakness (generalized): Secondary | ICD-10-CM | POA: Diagnosis not present

## 2021-03-03 DIAGNOSIS — I6330 Cerebral infarction due to thrombosis of unspecified cerebral artery: Secondary | ICD-10-CM | POA: Diagnosis not present

## 2021-03-03 DIAGNOSIS — M4802 Spinal stenosis, cervical region: Secondary | ICD-10-CM | POA: Diagnosis not present

## 2021-03-03 DIAGNOSIS — M5030 Other cervical disc degeneration, unspecified cervical region: Secondary | ICD-10-CM | POA: Diagnosis not present

## 2021-03-03 DIAGNOSIS — M6281 Muscle weakness (generalized): Secondary | ICD-10-CM | POA: Diagnosis not present

## 2021-03-03 DIAGNOSIS — R2681 Unsteadiness on feet: Secondary | ICD-10-CM | POA: Diagnosis not present

## 2021-05-04 DIAGNOSIS — K5900 Constipation, unspecified: Secondary | ICD-10-CM | POA: Diagnosis not present

## 2021-05-04 DIAGNOSIS — D649 Anemia, unspecified: Secondary | ICD-10-CM | POA: Diagnosis not present

## 2021-05-04 DIAGNOSIS — K219 Gastro-esophageal reflux disease without esophagitis: Secondary | ICD-10-CM | POA: Diagnosis not present

## 2021-05-27 DIAGNOSIS — M2041 Other hammer toe(s) (acquired), right foot: Secondary | ICD-10-CM | POA: Diagnosis not present

## 2021-05-27 DIAGNOSIS — L603 Nail dystrophy: Secondary | ICD-10-CM | POA: Diagnosis not present

## 2021-05-27 DIAGNOSIS — M2042 Other hammer toe(s) (acquired), left foot: Secondary | ICD-10-CM | POA: Diagnosis not present

## 2021-05-27 DIAGNOSIS — E119 Type 2 diabetes mellitus without complications: Secondary | ICD-10-CM | POA: Diagnosis not present

## 2021-06-01 DIAGNOSIS — R278 Other lack of coordination: Secondary | ICD-10-CM | POA: Diagnosis not present

## 2021-06-01 DIAGNOSIS — I6330 Cerebral infarction due to thrombosis of unspecified cerebral artery: Secondary | ICD-10-CM | POA: Diagnosis not present

## 2021-06-01 DIAGNOSIS — E11649 Type 2 diabetes mellitus with hypoglycemia without coma: Secondary | ICD-10-CM | POA: Diagnosis not present

## 2021-06-01 DIAGNOSIS — R2681 Unsteadiness on feet: Secondary | ICD-10-CM | POA: Diagnosis not present

## 2021-06-01 DIAGNOSIS — M6259 Muscle wasting and atrophy, not elsewhere classified, multiple sites: Secondary | ICD-10-CM | POA: Diagnosis not present

## 2021-06-01 DIAGNOSIS — R2689 Other abnormalities of gait and mobility: Secondary | ICD-10-CM | POA: Diagnosis not present

## 2021-06-01 DIAGNOSIS — M6281 Muscle weakness (generalized): Secondary | ICD-10-CM | POA: Diagnosis not present

## 2021-06-02 DIAGNOSIS — E113553 Type 2 diabetes mellitus with stable proliferative diabetic retinopathy, bilateral: Secondary | ICD-10-CM | POA: Diagnosis not present

## 2021-06-02 DIAGNOSIS — Z961 Presence of intraocular lens: Secondary | ICD-10-CM | POA: Diagnosis not present

## 2021-06-02 DIAGNOSIS — H524 Presbyopia: Secondary | ICD-10-CM | POA: Diagnosis not present

## 2021-06-03 DIAGNOSIS — I6330 Cerebral infarction due to thrombosis of unspecified cerebral artery: Secondary | ICD-10-CM | POA: Diagnosis not present

## 2021-06-03 DIAGNOSIS — H5213 Myopia, bilateral: Secondary | ICD-10-CM | POA: Diagnosis not present

## 2021-06-03 DIAGNOSIS — R278 Other lack of coordination: Secondary | ICD-10-CM | POA: Diagnosis not present

## 2021-06-03 DIAGNOSIS — M6259 Muscle wasting and atrophy, not elsewhere classified, multiple sites: Secondary | ICD-10-CM | POA: Diagnosis not present

## 2021-06-03 DIAGNOSIS — E11649 Type 2 diabetes mellitus with hypoglycemia without coma: Secondary | ICD-10-CM | POA: Diagnosis not present

## 2021-06-03 DIAGNOSIS — R2689 Other abnormalities of gait and mobility: Secondary | ICD-10-CM | POA: Diagnosis not present

## 2021-06-03 DIAGNOSIS — R2681 Unsteadiness on feet: Secondary | ICD-10-CM | POA: Diagnosis not present

## 2021-06-03 DIAGNOSIS — M6281 Muscle weakness (generalized): Secondary | ICD-10-CM | POA: Diagnosis not present

## 2021-06-04 DIAGNOSIS — R278 Other lack of coordination: Secondary | ICD-10-CM | POA: Diagnosis not present

## 2021-06-04 DIAGNOSIS — M6281 Muscle weakness (generalized): Secondary | ICD-10-CM | POA: Diagnosis not present

## 2021-06-04 DIAGNOSIS — R2689 Other abnormalities of gait and mobility: Secondary | ICD-10-CM | POA: Diagnosis not present

## 2021-06-04 DIAGNOSIS — I6330 Cerebral infarction due to thrombosis of unspecified cerebral artery: Secondary | ICD-10-CM | POA: Diagnosis not present

## 2021-06-04 DIAGNOSIS — R2681 Unsteadiness on feet: Secondary | ICD-10-CM | POA: Diagnosis not present

## 2021-06-04 DIAGNOSIS — E11649 Type 2 diabetes mellitus with hypoglycemia without coma: Secondary | ICD-10-CM | POA: Diagnosis not present

## 2021-06-04 DIAGNOSIS — M6259 Muscle wasting and atrophy, not elsewhere classified, multiple sites: Secondary | ICD-10-CM | POA: Diagnosis not present

## 2021-06-06 DIAGNOSIS — R2689 Other abnormalities of gait and mobility: Secondary | ICD-10-CM | POA: Diagnosis not present

## 2021-06-06 DIAGNOSIS — R278 Other lack of coordination: Secondary | ICD-10-CM | POA: Diagnosis not present

## 2021-06-06 DIAGNOSIS — E11649 Type 2 diabetes mellitus with hypoglycemia without coma: Secondary | ICD-10-CM | POA: Diagnosis not present

## 2021-06-06 DIAGNOSIS — I6330 Cerebral infarction due to thrombosis of unspecified cerebral artery: Secondary | ICD-10-CM | POA: Diagnosis not present

## 2021-06-06 DIAGNOSIS — M6259 Muscle wasting and atrophy, not elsewhere classified, multiple sites: Secondary | ICD-10-CM | POA: Diagnosis not present

## 2021-06-06 DIAGNOSIS — M6281 Muscle weakness (generalized): Secondary | ICD-10-CM | POA: Diagnosis not present

## 2021-06-06 DIAGNOSIS — R2681 Unsteadiness on feet: Secondary | ICD-10-CM | POA: Diagnosis not present

## 2021-06-07 DIAGNOSIS — R2681 Unsteadiness on feet: Secondary | ICD-10-CM | POA: Diagnosis not present

## 2021-06-07 DIAGNOSIS — M6281 Muscle weakness (generalized): Secondary | ICD-10-CM | POA: Diagnosis not present

## 2021-06-07 DIAGNOSIS — I6330 Cerebral infarction due to thrombosis of unspecified cerebral artery: Secondary | ICD-10-CM | POA: Diagnosis not present

## 2021-06-07 DIAGNOSIS — R2689 Other abnormalities of gait and mobility: Secondary | ICD-10-CM | POA: Diagnosis not present

## 2021-06-07 DIAGNOSIS — M6259 Muscle wasting and atrophy, not elsewhere classified, multiple sites: Secondary | ICD-10-CM | POA: Diagnosis not present

## 2021-06-07 DIAGNOSIS — E11649 Type 2 diabetes mellitus with hypoglycemia without coma: Secondary | ICD-10-CM | POA: Diagnosis not present

## 2021-06-07 DIAGNOSIS — R278 Other lack of coordination: Secondary | ICD-10-CM | POA: Diagnosis not present

## 2021-06-08 DIAGNOSIS — I6330 Cerebral infarction due to thrombosis of unspecified cerebral artery: Secondary | ICD-10-CM | POA: Diagnosis not present

## 2021-06-08 DIAGNOSIS — E11649 Type 2 diabetes mellitus with hypoglycemia without coma: Secondary | ICD-10-CM | POA: Diagnosis not present

## 2021-06-08 DIAGNOSIS — R278 Other lack of coordination: Secondary | ICD-10-CM | POA: Diagnosis not present

## 2021-06-08 DIAGNOSIS — M6281 Muscle weakness (generalized): Secondary | ICD-10-CM | POA: Diagnosis not present

## 2021-06-08 DIAGNOSIS — R2681 Unsteadiness on feet: Secondary | ICD-10-CM | POA: Diagnosis not present

## 2021-06-08 DIAGNOSIS — M6259 Muscle wasting and atrophy, not elsewhere classified, multiple sites: Secondary | ICD-10-CM | POA: Diagnosis not present

## 2021-06-08 DIAGNOSIS — R2689 Other abnormalities of gait and mobility: Secondary | ICD-10-CM | POA: Diagnosis not present

## 2021-06-09 DIAGNOSIS — E11649 Type 2 diabetes mellitus with hypoglycemia without coma: Secondary | ICD-10-CM | POA: Diagnosis not present

## 2021-06-09 DIAGNOSIS — R278 Other lack of coordination: Secondary | ICD-10-CM | POA: Diagnosis not present

## 2021-06-09 DIAGNOSIS — R2681 Unsteadiness on feet: Secondary | ICD-10-CM | POA: Diagnosis not present

## 2021-06-09 DIAGNOSIS — I6330 Cerebral infarction due to thrombosis of unspecified cerebral artery: Secondary | ICD-10-CM | POA: Diagnosis not present

## 2021-06-09 DIAGNOSIS — M6259 Muscle wasting and atrophy, not elsewhere classified, multiple sites: Secondary | ICD-10-CM | POA: Diagnosis not present

## 2021-06-09 DIAGNOSIS — R2689 Other abnormalities of gait and mobility: Secondary | ICD-10-CM | POA: Diagnosis not present

## 2021-06-09 DIAGNOSIS — M6281 Muscle weakness (generalized): Secondary | ICD-10-CM | POA: Diagnosis not present

## 2021-06-11 DIAGNOSIS — R2681 Unsteadiness on feet: Secondary | ICD-10-CM | POA: Diagnosis not present

## 2021-06-11 DIAGNOSIS — I6330 Cerebral infarction due to thrombosis of unspecified cerebral artery: Secondary | ICD-10-CM | POA: Diagnosis not present

## 2021-06-11 DIAGNOSIS — M6281 Muscle weakness (generalized): Secondary | ICD-10-CM | POA: Diagnosis not present

## 2021-06-11 DIAGNOSIS — R2689 Other abnormalities of gait and mobility: Secondary | ICD-10-CM | POA: Diagnosis not present

## 2021-06-11 DIAGNOSIS — M6259 Muscle wasting and atrophy, not elsewhere classified, multiple sites: Secondary | ICD-10-CM | POA: Diagnosis not present

## 2021-06-11 DIAGNOSIS — E11649 Type 2 diabetes mellitus with hypoglycemia without coma: Secondary | ICD-10-CM | POA: Diagnosis not present

## 2021-06-11 DIAGNOSIS — R278 Other lack of coordination: Secondary | ICD-10-CM | POA: Diagnosis not present

## 2021-06-14 DIAGNOSIS — E11649 Type 2 diabetes mellitus with hypoglycemia without coma: Secondary | ICD-10-CM | POA: Diagnosis not present

## 2021-06-14 DIAGNOSIS — R278 Other lack of coordination: Secondary | ICD-10-CM | POA: Diagnosis not present

## 2021-06-14 DIAGNOSIS — I6330 Cerebral infarction due to thrombosis of unspecified cerebral artery: Secondary | ICD-10-CM | POA: Diagnosis not present

## 2021-06-14 DIAGNOSIS — M6259 Muscle wasting and atrophy, not elsewhere classified, multiple sites: Secondary | ICD-10-CM | POA: Diagnosis not present

## 2021-06-14 DIAGNOSIS — R2689 Other abnormalities of gait and mobility: Secondary | ICD-10-CM | POA: Diagnosis not present

## 2021-06-14 DIAGNOSIS — R2681 Unsteadiness on feet: Secondary | ICD-10-CM | POA: Diagnosis not present

## 2021-06-14 DIAGNOSIS — M6281 Muscle weakness (generalized): Secondary | ICD-10-CM | POA: Diagnosis not present

## 2021-06-15 DIAGNOSIS — M6259 Muscle wasting and atrophy, not elsewhere classified, multiple sites: Secondary | ICD-10-CM | POA: Diagnosis not present

## 2021-06-15 DIAGNOSIS — R2689 Other abnormalities of gait and mobility: Secondary | ICD-10-CM | POA: Diagnosis not present

## 2021-06-15 DIAGNOSIS — I6330 Cerebral infarction due to thrombosis of unspecified cerebral artery: Secondary | ICD-10-CM | POA: Diagnosis not present

## 2021-06-15 DIAGNOSIS — E11649 Type 2 diabetes mellitus with hypoglycemia without coma: Secondary | ICD-10-CM | POA: Diagnosis not present

## 2021-06-15 DIAGNOSIS — M6281 Muscle weakness (generalized): Secondary | ICD-10-CM | POA: Diagnosis not present

## 2021-06-15 DIAGNOSIS — R278 Other lack of coordination: Secondary | ICD-10-CM | POA: Diagnosis not present

## 2021-06-15 DIAGNOSIS — R2681 Unsteadiness on feet: Secondary | ICD-10-CM | POA: Diagnosis not present

## 2021-06-16 DIAGNOSIS — I6330 Cerebral infarction due to thrombosis of unspecified cerebral artery: Secondary | ICD-10-CM | POA: Diagnosis not present

## 2021-06-16 DIAGNOSIS — R2681 Unsteadiness on feet: Secondary | ICD-10-CM | POA: Diagnosis not present

## 2021-06-16 DIAGNOSIS — M6281 Muscle weakness (generalized): Secondary | ICD-10-CM | POA: Diagnosis not present

## 2021-06-16 DIAGNOSIS — E11649 Type 2 diabetes mellitus with hypoglycemia without coma: Secondary | ICD-10-CM | POA: Diagnosis not present

## 2021-06-16 DIAGNOSIS — M6259 Muscle wasting and atrophy, not elsewhere classified, multiple sites: Secondary | ICD-10-CM | POA: Diagnosis not present

## 2021-06-16 DIAGNOSIS — R2689 Other abnormalities of gait and mobility: Secondary | ICD-10-CM | POA: Diagnosis not present

## 2021-06-16 DIAGNOSIS — R278 Other lack of coordination: Secondary | ICD-10-CM | POA: Diagnosis not present

## 2021-06-17 DIAGNOSIS — E11649 Type 2 diabetes mellitus with hypoglycemia without coma: Secondary | ICD-10-CM | POA: Diagnosis not present

## 2021-06-17 DIAGNOSIS — I6330 Cerebral infarction due to thrombosis of unspecified cerebral artery: Secondary | ICD-10-CM | POA: Diagnosis not present

## 2021-06-17 DIAGNOSIS — M6259 Muscle wasting and atrophy, not elsewhere classified, multiple sites: Secondary | ICD-10-CM | POA: Diagnosis not present

## 2021-06-17 DIAGNOSIS — R2689 Other abnormalities of gait and mobility: Secondary | ICD-10-CM | POA: Diagnosis not present

## 2021-06-17 DIAGNOSIS — R2681 Unsteadiness on feet: Secondary | ICD-10-CM | POA: Diagnosis not present

## 2021-06-17 DIAGNOSIS — R278 Other lack of coordination: Secondary | ICD-10-CM | POA: Diagnosis not present

## 2021-06-17 DIAGNOSIS — M6281 Muscle weakness (generalized): Secondary | ICD-10-CM | POA: Diagnosis not present

## 2021-06-18 DIAGNOSIS — M6281 Muscle weakness (generalized): Secondary | ICD-10-CM | POA: Diagnosis not present

## 2021-06-18 DIAGNOSIS — E11649 Type 2 diabetes mellitus with hypoglycemia without coma: Secondary | ICD-10-CM | POA: Diagnosis not present

## 2021-06-18 DIAGNOSIS — R2681 Unsteadiness on feet: Secondary | ICD-10-CM | POA: Diagnosis not present

## 2021-06-18 DIAGNOSIS — I6330 Cerebral infarction due to thrombosis of unspecified cerebral artery: Secondary | ICD-10-CM | POA: Diagnosis not present

## 2021-06-18 DIAGNOSIS — R2689 Other abnormalities of gait and mobility: Secondary | ICD-10-CM | POA: Diagnosis not present

## 2021-06-18 DIAGNOSIS — R278 Other lack of coordination: Secondary | ICD-10-CM | POA: Diagnosis not present

## 2021-06-18 DIAGNOSIS — M6259 Muscle wasting and atrophy, not elsewhere classified, multiple sites: Secondary | ICD-10-CM | POA: Diagnosis not present

## 2021-06-21 DIAGNOSIS — M6259 Muscle wasting and atrophy, not elsewhere classified, multiple sites: Secondary | ICD-10-CM | POA: Diagnosis not present

## 2021-06-21 DIAGNOSIS — R2689 Other abnormalities of gait and mobility: Secondary | ICD-10-CM | POA: Diagnosis not present

## 2021-06-21 DIAGNOSIS — E11649 Type 2 diabetes mellitus with hypoglycemia without coma: Secondary | ICD-10-CM | POA: Diagnosis not present

## 2021-06-21 DIAGNOSIS — M6281 Muscle weakness (generalized): Secondary | ICD-10-CM | POA: Diagnosis not present

## 2021-06-21 DIAGNOSIS — R2681 Unsteadiness on feet: Secondary | ICD-10-CM | POA: Diagnosis not present

## 2021-06-21 DIAGNOSIS — R278 Other lack of coordination: Secondary | ICD-10-CM | POA: Diagnosis not present

## 2021-06-21 DIAGNOSIS — I6330 Cerebral infarction due to thrombosis of unspecified cerebral artery: Secondary | ICD-10-CM | POA: Diagnosis not present

## 2021-06-22 DIAGNOSIS — E11649 Type 2 diabetes mellitus with hypoglycemia without coma: Secondary | ICD-10-CM | POA: Diagnosis not present

## 2021-06-22 DIAGNOSIS — R2689 Other abnormalities of gait and mobility: Secondary | ICD-10-CM | POA: Diagnosis not present

## 2021-06-22 DIAGNOSIS — M6259 Muscle wasting and atrophy, not elsewhere classified, multiple sites: Secondary | ICD-10-CM | POA: Diagnosis not present

## 2021-06-22 DIAGNOSIS — R2681 Unsteadiness on feet: Secondary | ICD-10-CM | POA: Diagnosis not present

## 2021-06-22 DIAGNOSIS — R278 Other lack of coordination: Secondary | ICD-10-CM | POA: Diagnosis not present

## 2021-06-22 DIAGNOSIS — I6330 Cerebral infarction due to thrombosis of unspecified cerebral artery: Secondary | ICD-10-CM | POA: Diagnosis not present

## 2021-06-22 DIAGNOSIS — M6281 Muscle weakness (generalized): Secondary | ICD-10-CM | POA: Diagnosis not present

## 2021-06-23 DIAGNOSIS — R2689 Other abnormalities of gait and mobility: Secondary | ICD-10-CM | POA: Diagnosis not present

## 2021-06-23 DIAGNOSIS — M6281 Muscle weakness (generalized): Secondary | ICD-10-CM | POA: Diagnosis not present

## 2021-06-23 DIAGNOSIS — E11649 Type 2 diabetes mellitus with hypoglycemia without coma: Secondary | ICD-10-CM | POA: Diagnosis not present

## 2021-06-23 DIAGNOSIS — I6330 Cerebral infarction due to thrombosis of unspecified cerebral artery: Secondary | ICD-10-CM | POA: Diagnosis not present

## 2021-06-23 DIAGNOSIS — R2681 Unsteadiness on feet: Secondary | ICD-10-CM | POA: Diagnosis not present

## 2021-06-23 DIAGNOSIS — M6259 Muscle wasting and atrophy, not elsewhere classified, multiple sites: Secondary | ICD-10-CM | POA: Diagnosis not present

## 2021-06-23 DIAGNOSIS — R278 Other lack of coordination: Secondary | ICD-10-CM | POA: Diagnosis not present

## 2021-06-24 DIAGNOSIS — E11649 Type 2 diabetes mellitus with hypoglycemia without coma: Secondary | ICD-10-CM | POA: Diagnosis not present

## 2021-06-24 DIAGNOSIS — R278 Other lack of coordination: Secondary | ICD-10-CM | POA: Diagnosis not present

## 2021-06-24 DIAGNOSIS — M6281 Muscle weakness (generalized): Secondary | ICD-10-CM | POA: Diagnosis not present

## 2021-06-24 DIAGNOSIS — R2681 Unsteadiness on feet: Secondary | ICD-10-CM | POA: Diagnosis not present

## 2021-06-24 DIAGNOSIS — M6259 Muscle wasting and atrophy, not elsewhere classified, multiple sites: Secondary | ICD-10-CM | POA: Diagnosis not present

## 2021-06-24 DIAGNOSIS — I6330 Cerebral infarction due to thrombosis of unspecified cerebral artery: Secondary | ICD-10-CM | POA: Diagnosis not present

## 2021-06-24 DIAGNOSIS — R2689 Other abnormalities of gait and mobility: Secondary | ICD-10-CM | POA: Diagnosis not present

## 2021-06-25 DIAGNOSIS — M6259 Muscle wasting and atrophy, not elsewhere classified, multiple sites: Secondary | ICD-10-CM | POA: Diagnosis not present

## 2021-06-25 DIAGNOSIS — M6281 Muscle weakness (generalized): Secondary | ICD-10-CM | POA: Diagnosis not present

## 2021-06-25 DIAGNOSIS — I6330 Cerebral infarction due to thrombosis of unspecified cerebral artery: Secondary | ICD-10-CM | POA: Diagnosis not present

## 2021-06-25 DIAGNOSIS — R278 Other lack of coordination: Secondary | ICD-10-CM | POA: Diagnosis not present

## 2021-06-25 DIAGNOSIS — R2689 Other abnormalities of gait and mobility: Secondary | ICD-10-CM | POA: Diagnosis not present

## 2021-06-25 DIAGNOSIS — R2681 Unsteadiness on feet: Secondary | ICD-10-CM | POA: Diagnosis not present

## 2021-06-25 DIAGNOSIS — E11649 Type 2 diabetes mellitus with hypoglycemia without coma: Secondary | ICD-10-CM | POA: Diagnosis not present

## 2021-06-28 DIAGNOSIS — M6281 Muscle weakness (generalized): Secondary | ICD-10-CM | POA: Diagnosis not present

## 2021-06-28 DIAGNOSIS — I6330 Cerebral infarction due to thrombosis of unspecified cerebral artery: Secondary | ICD-10-CM | POA: Diagnosis not present

## 2021-06-28 DIAGNOSIS — I2510 Atherosclerotic heart disease of native coronary artery without angina pectoris: Secondary | ICD-10-CM | POA: Diagnosis not present

## 2021-06-28 DIAGNOSIS — E119 Type 2 diabetes mellitus without complications: Secondary | ICD-10-CM | POA: Diagnosis not present

## 2021-06-28 DIAGNOSIS — R278 Other lack of coordination: Secondary | ICD-10-CM | POA: Diagnosis not present

## 2021-06-28 DIAGNOSIS — E785 Hyperlipidemia, unspecified: Secondary | ICD-10-CM | POA: Diagnosis not present

## 2021-06-28 DIAGNOSIS — I10 Essential (primary) hypertension: Secondary | ICD-10-CM | POA: Diagnosis not present

## 2021-06-28 DIAGNOSIS — R2681 Unsteadiness on feet: Secondary | ICD-10-CM | POA: Diagnosis not present

## 2021-06-28 DIAGNOSIS — M6259 Muscle wasting and atrophy, not elsewhere classified, multiple sites: Secondary | ICD-10-CM | POA: Diagnosis not present

## 2021-06-28 DIAGNOSIS — R5381 Other malaise: Secondary | ICD-10-CM | POA: Diagnosis not present

## 2021-06-28 DIAGNOSIS — E11649 Type 2 diabetes mellitus with hypoglycemia without coma: Secondary | ICD-10-CM | POA: Diagnosis not present

## 2021-06-28 DIAGNOSIS — R2689 Other abnormalities of gait and mobility: Secondary | ICD-10-CM | POA: Diagnosis not present

## 2021-06-29 DIAGNOSIS — M6259 Muscle wasting and atrophy, not elsewhere classified, multiple sites: Secondary | ICD-10-CM | POA: Diagnosis not present

## 2021-06-29 DIAGNOSIS — R2681 Unsteadiness on feet: Secondary | ICD-10-CM | POA: Diagnosis not present

## 2021-06-29 DIAGNOSIS — E11649 Type 2 diabetes mellitus with hypoglycemia without coma: Secondary | ICD-10-CM | POA: Diagnosis not present

## 2021-06-29 DIAGNOSIS — R2689 Other abnormalities of gait and mobility: Secondary | ICD-10-CM | POA: Diagnosis not present

## 2021-06-29 DIAGNOSIS — R278 Other lack of coordination: Secondary | ICD-10-CM | POA: Diagnosis not present

## 2021-06-29 DIAGNOSIS — M6281 Muscle weakness (generalized): Secondary | ICD-10-CM | POA: Diagnosis not present

## 2021-06-29 DIAGNOSIS — I6330 Cerebral infarction due to thrombosis of unspecified cerebral artery: Secondary | ICD-10-CM | POA: Diagnosis not present

## 2021-06-30 DIAGNOSIS — M6281 Muscle weakness (generalized): Secondary | ICD-10-CM | POA: Diagnosis not present

## 2021-06-30 DIAGNOSIS — R278 Other lack of coordination: Secondary | ICD-10-CM | POA: Diagnosis not present

## 2021-06-30 DIAGNOSIS — R2689 Other abnormalities of gait and mobility: Secondary | ICD-10-CM | POA: Diagnosis not present

## 2021-06-30 DIAGNOSIS — R2681 Unsteadiness on feet: Secondary | ICD-10-CM | POA: Diagnosis not present

## 2021-06-30 DIAGNOSIS — E11649 Type 2 diabetes mellitus with hypoglycemia without coma: Secondary | ICD-10-CM | POA: Diagnosis not present

## 2021-06-30 DIAGNOSIS — I6330 Cerebral infarction due to thrombosis of unspecified cerebral artery: Secondary | ICD-10-CM | POA: Diagnosis not present

## 2021-06-30 DIAGNOSIS — M6259 Muscle wasting and atrophy, not elsewhere classified, multiple sites: Secondary | ICD-10-CM | POA: Diagnosis not present

## 2021-07-01 DIAGNOSIS — I6330 Cerebral infarction due to thrombosis of unspecified cerebral artery: Secondary | ICD-10-CM | POA: Diagnosis not present

## 2021-07-01 DIAGNOSIS — R2689 Other abnormalities of gait and mobility: Secondary | ICD-10-CM | POA: Diagnosis not present

## 2021-07-01 DIAGNOSIS — E11649 Type 2 diabetes mellitus with hypoglycemia without coma: Secondary | ICD-10-CM | POA: Diagnosis not present

## 2021-07-01 DIAGNOSIS — M6281 Muscle weakness (generalized): Secondary | ICD-10-CM | POA: Diagnosis not present

## 2021-07-01 DIAGNOSIS — R278 Other lack of coordination: Secondary | ICD-10-CM | POA: Diagnosis not present

## 2021-07-01 DIAGNOSIS — R2681 Unsteadiness on feet: Secondary | ICD-10-CM | POA: Diagnosis not present

## 2021-07-01 DIAGNOSIS — M6259 Muscle wasting and atrophy, not elsewhere classified, multiple sites: Secondary | ICD-10-CM | POA: Diagnosis not present

## 2021-07-02 DIAGNOSIS — M6259 Muscle wasting and atrophy, not elsewhere classified, multiple sites: Secondary | ICD-10-CM | POA: Diagnosis not present

## 2021-07-02 DIAGNOSIS — R278 Other lack of coordination: Secondary | ICD-10-CM | POA: Diagnosis not present

## 2021-07-02 DIAGNOSIS — E11649 Type 2 diabetes mellitus with hypoglycemia without coma: Secondary | ICD-10-CM | POA: Diagnosis not present

## 2021-07-02 DIAGNOSIS — R2689 Other abnormalities of gait and mobility: Secondary | ICD-10-CM | POA: Diagnosis not present

## 2021-07-02 DIAGNOSIS — M6281 Muscle weakness (generalized): Secondary | ICD-10-CM | POA: Diagnosis not present

## 2021-07-02 DIAGNOSIS — I6330 Cerebral infarction due to thrombosis of unspecified cerebral artery: Secondary | ICD-10-CM | POA: Diagnosis not present

## 2021-07-02 DIAGNOSIS — R2681 Unsteadiness on feet: Secondary | ICD-10-CM | POA: Diagnosis not present

## 2021-07-05 DIAGNOSIS — R278 Other lack of coordination: Secondary | ICD-10-CM | POA: Diagnosis not present

## 2021-07-05 DIAGNOSIS — R2689 Other abnormalities of gait and mobility: Secondary | ICD-10-CM | POA: Diagnosis not present

## 2021-07-05 DIAGNOSIS — I6330 Cerebral infarction due to thrombosis of unspecified cerebral artery: Secondary | ICD-10-CM | POA: Diagnosis not present

## 2021-07-05 DIAGNOSIS — M6281 Muscle weakness (generalized): Secondary | ICD-10-CM | POA: Diagnosis not present

## 2021-07-05 DIAGNOSIS — M6259 Muscle wasting and atrophy, not elsewhere classified, multiple sites: Secondary | ICD-10-CM | POA: Diagnosis not present

## 2021-07-05 DIAGNOSIS — E11649 Type 2 diabetes mellitus with hypoglycemia without coma: Secondary | ICD-10-CM | POA: Diagnosis not present

## 2021-07-05 DIAGNOSIS — R2681 Unsteadiness on feet: Secondary | ICD-10-CM | POA: Diagnosis not present

## 2021-07-06 DIAGNOSIS — R2689 Other abnormalities of gait and mobility: Secondary | ICD-10-CM | POA: Diagnosis not present

## 2021-07-06 DIAGNOSIS — M6259 Muscle wasting and atrophy, not elsewhere classified, multiple sites: Secondary | ICD-10-CM | POA: Diagnosis not present

## 2021-07-06 DIAGNOSIS — R278 Other lack of coordination: Secondary | ICD-10-CM | POA: Diagnosis not present

## 2021-07-06 DIAGNOSIS — E11649 Type 2 diabetes mellitus with hypoglycemia without coma: Secondary | ICD-10-CM | POA: Diagnosis not present

## 2021-07-06 DIAGNOSIS — I6330 Cerebral infarction due to thrombosis of unspecified cerebral artery: Secondary | ICD-10-CM | POA: Diagnosis not present

## 2021-07-06 DIAGNOSIS — M6281 Muscle weakness (generalized): Secondary | ICD-10-CM | POA: Diagnosis not present

## 2021-07-06 DIAGNOSIS — R2681 Unsteadiness on feet: Secondary | ICD-10-CM | POA: Diagnosis not present

## 2021-07-07 DIAGNOSIS — I6330 Cerebral infarction due to thrombosis of unspecified cerebral artery: Secondary | ICD-10-CM | POA: Diagnosis not present

## 2021-07-07 DIAGNOSIS — R2681 Unsteadiness on feet: Secondary | ICD-10-CM | POA: Diagnosis not present

## 2021-07-07 DIAGNOSIS — M6281 Muscle weakness (generalized): Secondary | ICD-10-CM | POA: Diagnosis not present

## 2021-07-07 DIAGNOSIS — E11649 Type 2 diabetes mellitus with hypoglycemia without coma: Secondary | ICD-10-CM | POA: Diagnosis not present

## 2021-07-07 DIAGNOSIS — M6259 Muscle wasting and atrophy, not elsewhere classified, multiple sites: Secondary | ICD-10-CM | POA: Diagnosis not present

## 2021-07-07 DIAGNOSIS — R278 Other lack of coordination: Secondary | ICD-10-CM | POA: Diagnosis not present

## 2021-07-07 DIAGNOSIS — R2689 Other abnormalities of gait and mobility: Secondary | ICD-10-CM | POA: Diagnosis not present

## 2021-07-08 DIAGNOSIS — I6330 Cerebral infarction due to thrombosis of unspecified cerebral artery: Secondary | ICD-10-CM | POA: Diagnosis not present

## 2021-07-08 DIAGNOSIS — E11649 Type 2 diabetes mellitus with hypoglycemia without coma: Secondary | ICD-10-CM | POA: Diagnosis not present

## 2021-07-08 DIAGNOSIS — M6281 Muscle weakness (generalized): Secondary | ICD-10-CM | POA: Diagnosis not present

## 2021-07-08 DIAGNOSIS — M6259 Muscle wasting and atrophy, not elsewhere classified, multiple sites: Secondary | ICD-10-CM | POA: Diagnosis not present

## 2021-07-08 DIAGNOSIS — R2689 Other abnormalities of gait and mobility: Secondary | ICD-10-CM | POA: Diagnosis not present

## 2021-07-08 DIAGNOSIS — R2681 Unsteadiness on feet: Secondary | ICD-10-CM | POA: Diagnosis not present

## 2021-07-08 DIAGNOSIS — R278 Other lack of coordination: Secondary | ICD-10-CM | POA: Diagnosis not present

## 2021-07-09 DIAGNOSIS — E11649 Type 2 diabetes mellitus with hypoglycemia without coma: Secondary | ICD-10-CM | POA: Diagnosis not present

## 2021-07-09 DIAGNOSIS — R278 Other lack of coordination: Secondary | ICD-10-CM | POA: Diagnosis not present

## 2021-07-09 DIAGNOSIS — R2681 Unsteadiness on feet: Secondary | ICD-10-CM | POA: Diagnosis not present

## 2021-07-09 DIAGNOSIS — M6281 Muscle weakness (generalized): Secondary | ICD-10-CM | POA: Diagnosis not present

## 2021-07-09 DIAGNOSIS — R2689 Other abnormalities of gait and mobility: Secondary | ICD-10-CM | POA: Diagnosis not present

## 2021-07-09 DIAGNOSIS — M6259 Muscle wasting and atrophy, not elsewhere classified, multiple sites: Secondary | ICD-10-CM | POA: Diagnosis not present

## 2021-07-09 DIAGNOSIS — I6330 Cerebral infarction due to thrombosis of unspecified cerebral artery: Secondary | ICD-10-CM | POA: Diagnosis not present

## 2021-07-11 DIAGNOSIS — M6259 Muscle wasting and atrophy, not elsewhere classified, multiple sites: Secondary | ICD-10-CM | POA: Diagnosis not present

## 2021-07-11 DIAGNOSIS — R2681 Unsteadiness on feet: Secondary | ICD-10-CM | POA: Diagnosis not present

## 2021-07-11 DIAGNOSIS — R2689 Other abnormalities of gait and mobility: Secondary | ICD-10-CM | POA: Diagnosis not present

## 2021-07-11 DIAGNOSIS — I6330 Cerebral infarction due to thrombosis of unspecified cerebral artery: Secondary | ICD-10-CM | POA: Diagnosis not present

## 2021-07-11 DIAGNOSIS — M6281 Muscle weakness (generalized): Secondary | ICD-10-CM | POA: Diagnosis not present

## 2021-07-11 DIAGNOSIS — E11649 Type 2 diabetes mellitus with hypoglycemia without coma: Secondary | ICD-10-CM | POA: Diagnosis not present

## 2021-07-11 DIAGNOSIS — R278 Other lack of coordination: Secondary | ICD-10-CM | POA: Diagnosis not present

## 2021-07-12 DIAGNOSIS — H903 Sensorineural hearing loss, bilateral: Secondary | ICD-10-CM | POA: Diagnosis not present

## 2021-07-13 DIAGNOSIS — R2681 Unsteadiness on feet: Secondary | ICD-10-CM | POA: Diagnosis not present

## 2021-07-13 DIAGNOSIS — I6330 Cerebral infarction due to thrombosis of unspecified cerebral artery: Secondary | ICD-10-CM | POA: Diagnosis not present

## 2021-07-13 DIAGNOSIS — R278 Other lack of coordination: Secondary | ICD-10-CM | POA: Diagnosis not present

## 2021-07-13 DIAGNOSIS — E11649 Type 2 diabetes mellitus with hypoglycemia without coma: Secondary | ICD-10-CM | POA: Diagnosis not present

## 2021-07-13 DIAGNOSIS — R2689 Other abnormalities of gait and mobility: Secondary | ICD-10-CM | POA: Diagnosis not present

## 2021-07-13 DIAGNOSIS — M6259 Muscle wasting and atrophy, not elsewhere classified, multiple sites: Secondary | ICD-10-CM | POA: Diagnosis not present

## 2021-07-13 DIAGNOSIS — M6281 Muscle weakness (generalized): Secondary | ICD-10-CM | POA: Diagnosis not present

## 2021-07-14 DIAGNOSIS — L603 Nail dystrophy: Secondary | ICD-10-CM | POA: Diagnosis not present

## 2021-07-14 DIAGNOSIS — M6259 Muscle wasting and atrophy, not elsewhere classified, multiple sites: Secondary | ICD-10-CM | POA: Diagnosis not present

## 2021-07-14 DIAGNOSIS — M6281 Muscle weakness (generalized): Secondary | ICD-10-CM | POA: Diagnosis not present

## 2021-07-14 DIAGNOSIS — R2681 Unsteadiness on feet: Secondary | ICD-10-CM | POA: Diagnosis not present

## 2021-07-14 DIAGNOSIS — I739 Peripheral vascular disease, unspecified: Secondary | ICD-10-CM | POA: Diagnosis not present

## 2021-07-14 DIAGNOSIS — R278 Other lack of coordination: Secondary | ICD-10-CM | POA: Diagnosis not present

## 2021-07-14 DIAGNOSIS — I6330 Cerebral infarction due to thrombosis of unspecified cerebral artery: Secondary | ICD-10-CM | POA: Diagnosis not present

## 2021-07-14 DIAGNOSIS — R2689 Other abnormalities of gait and mobility: Secondary | ICD-10-CM | POA: Diagnosis not present

## 2021-07-14 DIAGNOSIS — E11649 Type 2 diabetes mellitus with hypoglycemia without coma: Secondary | ICD-10-CM | POA: Diagnosis not present

## 2021-07-15 DIAGNOSIS — R2689 Other abnormalities of gait and mobility: Secondary | ICD-10-CM | POA: Diagnosis not present

## 2021-07-15 DIAGNOSIS — R2681 Unsteadiness on feet: Secondary | ICD-10-CM | POA: Diagnosis not present

## 2021-07-15 DIAGNOSIS — I6330 Cerebral infarction due to thrombosis of unspecified cerebral artery: Secondary | ICD-10-CM | POA: Diagnosis not present

## 2021-07-15 DIAGNOSIS — M6259 Muscle wasting and atrophy, not elsewhere classified, multiple sites: Secondary | ICD-10-CM | POA: Diagnosis not present

## 2021-07-15 DIAGNOSIS — R278 Other lack of coordination: Secondary | ICD-10-CM | POA: Diagnosis not present

## 2021-07-15 DIAGNOSIS — M6281 Muscle weakness (generalized): Secondary | ICD-10-CM | POA: Diagnosis not present

## 2021-07-15 DIAGNOSIS — E11649 Type 2 diabetes mellitus with hypoglycemia without coma: Secondary | ICD-10-CM | POA: Diagnosis not present

## 2021-07-16 DIAGNOSIS — I6330 Cerebral infarction due to thrombosis of unspecified cerebral artery: Secondary | ICD-10-CM | POA: Diagnosis not present

## 2021-07-16 DIAGNOSIS — R2681 Unsteadiness on feet: Secondary | ICD-10-CM | POA: Diagnosis not present

## 2021-07-16 DIAGNOSIS — E11649 Type 2 diabetes mellitus with hypoglycemia without coma: Secondary | ICD-10-CM | POA: Diagnosis not present

## 2021-07-16 DIAGNOSIS — R2689 Other abnormalities of gait and mobility: Secondary | ICD-10-CM | POA: Diagnosis not present

## 2021-07-16 DIAGNOSIS — M6281 Muscle weakness (generalized): Secondary | ICD-10-CM | POA: Diagnosis not present

## 2021-07-16 DIAGNOSIS — R278 Other lack of coordination: Secondary | ICD-10-CM | POA: Diagnosis not present

## 2021-07-16 DIAGNOSIS — M6259 Muscle wasting and atrophy, not elsewhere classified, multiple sites: Secondary | ICD-10-CM | POA: Diagnosis not present

## 2021-07-20 DIAGNOSIS — E11649 Type 2 diabetes mellitus with hypoglycemia without coma: Secondary | ICD-10-CM | POA: Diagnosis not present

## 2021-07-20 DIAGNOSIS — R2689 Other abnormalities of gait and mobility: Secondary | ICD-10-CM | POA: Diagnosis not present

## 2021-07-20 DIAGNOSIS — M6281 Muscle weakness (generalized): Secondary | ICD-10-CM | POA: Diagnosis not present

## 2021-07-20 DIAGNOSIS — I6330 Cerebral infarction due to thrombosis of unspecified cerebral artery: Secondary | ICD-10-CM | POA: Diagnosis not present

## 2021-07-20 DIAGNOSIS — R278 Other lack of coordination: Secondary | ICD-10-CM | POA: Diagnosis not present

## 2021-07-20 DIAGNOSIS — M6259 Muscle wasting and atrophy, not elsewhere classified, multiple sites: Secondary | ICD-10-CM | POA: Diagnosis not present

## 2021-07-20 DIAGNOSIS — R2681 Unsteadiness on feet: Secondary | ICD-10-CM | POA: Diagnosis not present

## 2021-07-21 DIAGNOSIS — R278 Other lack of coordination: Secondary | ICD-10-CM | POA: Diagnosis not present

## 2021-07-21 DIAGNOSIS — R2689 Other abnormalities of gait and mobility: Secondary | ICD-10-CM | POA: Diagnosis not present

## 2021-07-21 DIAGNOSIS — M6281 Muscle weakness (generalized): Secondary | ICD-10-CM | POA: Diagnosis not present

## 2021-07-21 DIAGNOSIS — I6330 Cerebral infarction due to thrombosis of unspecified cerebral artery: Secondary | ICD-10-CM | POA: Diagnosis not present

## 2021-07-21 DIAGNOSIS — R2681 Unsteadiness on feet: Secondary | ICD-10-CM | POA: Diagnosis not present

## 2021-07-21 DIAGNOSIS — E11649 Type 2 diabetes mellitus with hypoglycemia without coma: Secondary | ICD-10-CM | POA: Diagnosis not present

## 2021-07-21 DIAGNOSIS — M6259 Muscle wasting and atrophy, not elsewhere classified, multiple sites: Secondary | ICD-10-CM | POA: Diagnosis not present

## 2021-07-22 DIAGNOSIS — I6330 Cerebral infarction due to thrombosis of unspecified cerebral artery: Secondary | ICD-10-CM | POA: Diagnosis not present

## 2021-07-22 DIAGNOSIS — R2689 Other abnormalities of gait and mobility: Secondary | ICD-10-CM | POA: Diagnosis not present

## 2021-07-22 DIAGNOSIS — R2681 Unsteadiness on feet: Secondary | ICD-10-CM | POA: Diagnosis not present

## 2021-07-22 DIAGNOSIS — E11649 Type 2 diabetes mellitus with hypoglycemia without coma: Secondary | ICD-10-CM | POA: Diagnosis not present

## 2021-07-22 DIAGNOSIS — R278 Other lack of coordination: Secondary | ICD-10-CM | POA: Diagnosis not present

## 2021-07-22 DIAGNOSIS — M6281 Muscle weakness (generalized): Secondary | ICD-10-CM | POA: Diagnosis not present

## 2021-07-22 DIAGNOSIS — M6259 Muscle wasting and atrophy, not elsewhere classified, multiple sites: Secondary | ICD-10-CM | POA: Diagnosis not present

## 2021-07-23 DIAGNOSIS — M6259 Muscle wasting and atrophy, not elsewhere classified, multiple sites: Secondary | ICD-10-CM | POA: Diagnosis not present

## 2021-07-23 DIAGNOSIS — I6330 Cerebral infarction due to thrombosis of unspecified cerebral artery: Secondary | ICD-10-CM | POA: Diagnosis not present

## 2021-07-23 DIAGNOSIS — E11649 Type 2 diabetes mellitus with hypoglycemia without coma: Secondary | ICD-10-CM | POA: Diagnosis not present

## 2021-07-23 DIAGNOSIS — R278 Other lack of coordination: Secondary | ICD-10-CM | POA: Diagnosis not present

## 2021-07-23 DIAGNOSIS — R2681 Unsteadiness on feet: Secondary | ICD-10-CM | POA: Diagnosis not present

## 2021-07-23 DIAGNOSIS — R2689 Other abnormalities of gait and mobility: Secondary | ICD-10-CM | POA: Diagnosis not present

## 2021-07-23 DIAGNOSIS — M6281 Muscle weakness (generalized): Secondary | ICD-10-CM | POA: Diagnosis not present

## 2021-07-24 DIAGNOSIS — R2681 Unsteadiness on feet: Secondary | ICD-10-CM | POA: Diagnosis not present

## 2021-07-24 DIAGNOSIS — E11649 Type 2 diabetes mellitus with hypoglycemia without coma: Secondary | ICD-10-CM | POA: Diagnosis not present

## 2021-07-24 DIAGNOSIS — R278 Other lack of coordination: Secondary | ICD-10-CM | POA: Diagnosis not present

## 2021-07-24 DIAGNOSIS — M6259 Muscle wasting and atrophy, not elsewhere classified, multiple sites: Secondary | ICD-10-CM | POA: Diagnosis not present

## 2021-07-24 DIAGNOSIS — M6281 Muscle weakness (generalized): Secondary | ICD-10-CM | POA: Diagnosis not present

## 2021-07-24 DIAGNOSIS — R2689 Other abnormalities of gait and mobility: Secondary | ICD-10-CM | POA: Diagnosis not present

## 2021-07-24 DIAGNOSIS — I6330 Cerebral infarction due to thrombosis of unspecified cerebral artery: Secondary | ICD-10-CM | POA: Diagnosis not present

## 2021-07-27 DIAGNOSIS — I6330 Cerebral infarction due to thrombosis of unspecified cerebral artery: Secondary | ICD-10-CM | POA: Diagnosis not present

## 2021-07-27 DIAGNOSIS — M6259 Muscle wasting and atrophy, not elsewhere classified, multiple sites: Secondary | ICD-10-CM | POA: Diagnosis not present

## 2021-07-27 DIAGNOSIS — E11649 Type 2 diabetes mellitus with hypoglycemia without coma: Secondary | ICD-10-CM | POA: Diagnosis not present

## 2021-07-27 DIAGNOSIS — R2681 Unsteadiness on feet: Secondary | ICD-10-CM | POA: Diagnosis not present

## 2021-07-27 DIAGNOSIS — R278 Other lack of coordination: Secondary | ICD-10-CM | POA: Diagnosis not present

## 2021-07-27 DIAGNOSIS — R2689 Other abnormalities of gait and mobility: Secondary | ICD-10-CM | POA: Diagnosis not present

## 2021-07-27 DIAGNOSIS — M6281 Muscle weakness (generalized): Secondary | ICD-10-CM | POA: Diagnosis not present

## 2021-07-28 DIAGNOSIS — R2689 Other abnormalities of gait and mobility: Secondary | ICD-10-CM | POA: Diagnosis not present

## 2021-07-28 DIAGNOSIS — R2681 Unsteadiness on feet: Secondary | ICD-10-CM | POA: Diagnosis not present

## 2021-07-28 DIAGNOSIS — R278 Other lack of coordination: Secondary | ICD-10-CM | POA: Diagnosis not present

## 2021-07-28 DIAGNOSIS — E11649 Type 2 diabetes mellitus with hypoglycemia without coma: Secondary | ICD-10-CM | POA: Diagnosis not present

## 2021-07-28 DIAGNOSIS — I6330 Cerebral infarction due to thrombosis of unspecified cerebral artery: Secondary | ICD-10-CM | POA: Diagnosis not present

## 2021-07-28 DIAGNOSIS — M6259 Muscle wasting and atrophy, not elsewhere classified, multiple sites: Secondary | ICD-10-CM | POA: Diagnosis not present

## 2021-07-28 DIAGNOSIS — M6281 Muscle weakness (generalized): Secondary | ICD-10-CM | POA: Diagnosis not present

## 2021-07-29 DIAGNOSIS — M6281 Muscle weakness (generalized): Secondary | ICD-10-CM | POA: Diagnosis not present

## 2021-07-29 DIAGNOSIS — I6330 Cerebral infarction due to thrombosis of unspecified cerebral artery: Secondary | ICD-10-CM | POA: Diagnosis not present

## 2021-07-29 DIAGNOSIS — R2689 Other abnormalities of gait and mobility: Secondary | ICD-10-CM | POA: Diagnosis not present

## 2021-07-29 DIAGNOSIS — R278 Other lack of coordination: Secondary | ICD-10-CM | POA: Diagnosis not present

## 2021-07-29 DIAGNOSIS — R2681 Unsteadiness on feet: Secondary | ICD-10-CM | POA: Diagnosis not present

## 2021-07-29 DIAGNOSIS — M6259 Muscle wasting and atrophy, not elsewhere classified, multiple sites: Secondary | ICD-10-CM | POA: Diagnosis not present

## 2021-07-29 DIAGNOSIS — E11649 Type 2 diabetes mellitus with hypoglycemia without coma: Secondary | ICD-10-CM | POA: Diagnosis not present

## 2021-08-26 DIAGNOSIS — E119 Type 2 diabetes mellitus without complications: Secondary | ICD-10-CM | POA: Diagnosis not present

## 2021-08-26 DIAGNOSIS — L603 Nail dystrophy: Secondary | ICD-10-CM | POA: Diagnosis not present

## 2021-09-27 DIAGNOSIS — K219 Gastro-esophageal reflux disease without esophagitis: Secondary | ICD-10-CM | POA: Diagnosis not present

## 2021-09-27 DIAGNOSIS — I2510 Atherosclerotic heart disease of native coronary artery without angina pectoris: Secondary | ICD-10-CM | POA: Diagnosis not present

## 2021-09-27 DIAGNOSIS — E119 Type 2 diabetes mellitus without complications: Secondary | ICD-10-CM | POA: Diagnosis not present

## 2021-09-29 DIAGNOSIS — I10 Essential (primary) hypertension: Secondary | ICD-10-CM | POA: Diagnosis not present

## 2021-09-29 DIAGNOSIS — E0800 Diabetes mellitus due to underlying condition with hyperosmolarity without nonketotic hyperglycemic-hyperosmolar coma (NKHHC): Secondary | ICD-10-CM | POA: Diagnosis not present

## 2021-09-29 DIAGNOSIS — D511 Vitamin B12 deficiency anemia due to selective vitamin B12 malabsorption with proteinuria: Secondary | ICD-10-CM | POA: Diagnosis not present

## 2021-10-20 DIAGNOSIS — Z794 Long term (current) use of insulin: Secondary | ICD-10-CM | POA: Diagnosis not present

## 2021-10-20 DIAGNOSIS — L853 Xerosis cutis: Secondary | ICD-10-CM | POA: Diagnosis not present

## 2021-10-20 DIAGNOSIS — E119 Type 2 diabetes mellitus without complications: Secondary | ICD-10-CM | POA: Diagnosis not present

## 2021-11-24 DIAGNOSIS — L03031 Cellulitis of right toe: Secondary | ICD-10-CM | POA: Diagnosis not present

## 2021-11-24 DIAGNOSIS — E11628 Type 2 diabetes mellitus with other skin complications: Secondary | ICD-10-CM | POA: Diagnosis not present

## 2021-11-24 DIAGNOSIS — M1990 Unspecified osteoarthritis, unspecified site: Secondary | ICD-10-CM | POA: Diagnosis not present

## 2021-11-24 DIAGNOSIS — M6281 Muscle weakness (generalized): Secondary | ICD-10-CM | POA: Diagnosis not present

## 2021-11-24 DIAGNOSIS — L600 Ingrowing nail: Secondary | ICD-10-CM | POA: Diagnosis not present

## 2021-12-01 DIAGNOSIS — M6281 Muscle weakness (generalized): Secondary | ICD-10-CM | POA: Diagnosis not present

## 2021-12-01 DIAGNOSIS — E11628 Type 2 diabetes mellitus with other skin complications: Secondary | ICD-10-CM | POA: Diagnosis not present

## 2021-12-01 DIAGNOSIS — L03031 Cellulitis of right toe: Secondary | ICD-10-CM | POA: Diagnosis not present

## 2021-12-08 DIAGNOSIS — E11628 Type 2 diabetes mellitus with other skin complications: Secondary | ICD-10-CM | POA: Diagnosis not present

## 2021-12-08 DIAGNOSIS — L03031 Cellulitis of right toe: Secondary | ICD-10-CM | POA: Diagnosis not present

## 2021-12-08 DIAGNOSIS — M6281 Muscle weakness (generalized): Secondary | ICD-10-CM | POA: Diagnosis not present

## 2021-12-09 DIAGNOSIS — I10 Essential (primary) hypertension: Secondary | ICD-10-CM | POA: Diagnosis not present

## 2021-12-09 DIAGNOSIS — E119 Type 2 diabetes mellitus without complications: Secondary | ICD-10-CM | POA: Diagnosis not present

## 2021-12-09 DIAGNOSIS — R5381 Other malaise: Secondary | ICD-10-CM | POA: Diagnosis not present

## 2021-12-09 DIAGNOSIS — L600 Ingrowing nail: Secondary | ICD-10-CM | POA: Diagnosis not present

## 2021-12-14 DIAGNOSIS — L97512 Non-pressure chronic ulcer of other part of right foot with fat layer exposed: Secondary | ICD-10-CM | POA: Diagnosis not present

## 2021-12-15 ENCOUNTER — Encounter (HOSPITAL_COMMUNITY): Payer: Self-pay | Admitting: Emergency Medicine

## 2021-12-15 ENCOUNTER — Inpatient Hospital Stay (HOSPITAL_COMMUNITY)
Admission: EM | Admit: 2021-12-15 | Discharge: 2021-12-17 | DRG: 603 | Disposition: A | Discharge status: Skilled Nursing Facility | Payer: Medicare Other | Source: Skilled Nursing Facility | Attending: Internal Medicine | Admitting: Internal Medicine

## 2021-12-15 ENCOUNTER — Other Ambulatory Visit: Payer: Self-pay

## 2021-12-15 ENCOUNTER — Emergency Department (HOSPITAL_COMMUNITY): Payer: Medicare Other

## 2021-12-15 DIAGNOSIS — D649 Anemia, unspecified: Secondary | ICD-10-CM | POA: Diagnosis not present

## 2021-12-15 DIAGNOSIS — Z20822 Contact with and (suspected) exposure to covid-19: Secondary | ICD-10-CM | POA: Diagnosis not present

## 2021-12-15 DIAGNOSIS — Z961 Presence of intraocular lens: Secondary | ICD-10-CM | POA: Diagnosis not present

## 2021-12-15 DIAGNOSIS — Z888 Allergy status to other drugs, medicaments and biological substances status: Secondary | ICD-10-CM

## 2021-12-15 DIAGNOSIS — E876 Hypokalemia: Secondary | ICD-10-CM | POA: Diagnosis not present

## 2021-12-15 DIAGNOSIS — L089 Local infection of the skin and subcutaneous tissue, unspecified: Secondary | ICD-10-CM | POA: Diagnosis not present

## 2021-12-15 DIAGNOSIS — E785 Hyperlipidemia, unspecified: Secondary | ICD-10-CM | POA: Diagnosis not present

## 2021-12-15 DIAGNOSIS — M85871 Other specified disorders of bone density and structure, right ankle and foot: Secondary | ICD-10-CM | POA: Diagnosis not present

## 2021-12-15 DIAGNOSIS — L02611 Cutaneous abscess of right foot: Secondary | ICD-10-CM | POA: Diagnosis present

## 2021-12-15 DIAGNOSIS — I6930 Unspecified sequelae of cerebral infarction: Secondary | ICD-10-CM

## 2021-12-15 DIAGNOSIS — L97519 Non-pressure chronic ulcer of other part of right foot with unspecified severity: Secondary | ICD-10-CM | POA: Diagnosis not present

## 2021-12-15 DIAGNOSIS — L600 Ingrowing nail: Secondary | ICD-10-CM | POA: Diagnosis not present

## 2021-12-15 DIAGNOSIS — Z7982 Long term (current) use of aspirin: Secondary | ICD-10-CM

## 2021-12-15 DIAGNOSIS — Z800 Family history of malignant neoplasm of digestive organs: Secondary | ICD-10-CM | POA: Diagnosis not present

## 2021-12-15 DIAGNOSIS — F32A Depression, unspecified: Secondary | ICD-10-CM | POA: Diagnosis present

## 2021-12-15 DIAGNOSIS — G8929 Other chronic pain: Secondary | ICD-10-CM | POA: Diagnosis present

## 2021-12-15 DIAGNOSIS — Z794 Long term (current) use of insulin: Secondary | ICD-10-CM | POA: Diagnosis not present

## 2021-12-15 DIAGNOSIS — Z79899 Other long term (current) drug therapy: Secondary | ICD-10-CM

## 2021-12-15 DIAGNOSIS — Z8249 Family history of ischemic heart disease and other diseases of the circulatory system: Secondary | ICD-10-CM | POA: Diagnosis not present

## 2021-12-15 DIAGNOSIS — E7800 Pure hypercholesterolemia, unspecified: Secondary | ICD-10-CM | POA: Insufficient documentation

## 2021-12-15 DIAGNOSIS — Z743 Need for continuous supervision: Secondary | ICD-10-CM | POA: Diagnosis not present

## 2021-12-15 DIAGNOSIS — L03031 Cellulitis of right toe: Principal | ICD-10-CM | POA: Diagnosis present

## 2021-12-15 DIAGNOSIS — Z9841 Cataract extraction status, right eye: Secondary | ICD-10-CM

## 2021-12-15 DIAGNOSIS — E11621 Type 2 diabetes mellitus with foot ulcer: Secondary | ICD-10-CM | POA: Diagnosis not present

## 2021-12-15 DIAGNOSIS — Z0389 Encounter for observation for other suspected diseases and conditions ruled out: Secondary | ICD-10-CM | POA: Diagnosis not present

## 2021-12-15 DIAGNOSIS — Z7902 Long term (current) use of antithrombotics/antiplatelets: Secondary | ICD-10-CM

## 2021-12-15 DIAGNOSIS — E1165 Type 2 diabetes mellitus with hyperglycemia: Secondary | ICD-10-CM | POA: Diagnosis present

## 2021-12-15 DIAGNOSIS — Z9842 Cataract extraction status, left eye: Secondary | ICD-10-CM

## 2021-12-15 DIAGNOSIS — E11628 Type 2 diabetes mellitus with other skin complications: Secondary | ICD-10-CM | POA: Diagnosis not present

## 2021-12-15 DIAGNOSIS — M6281 Muscle weakness (generalized): Secondary | ICD-10-CM | POA: Diagnosis not present

## 2021-12-15 DIAGNOSIS — R6889 Other general symptoms and signs: Secondary | ICD-10-CM | POA: Diagnosis not present

## 2021-12-15 DIAGNOSIS — I10 Essential (primary) hypertension: Secondary | ICD-10-CM | POA: Diagnosis not present

## 2021-12-15 DIAGNOSIS — M545 Low back pain, unspecified: Secondary | ICD-10-CM | POA: Diagnosis present

## 2021-12-15 DIAGNOSIS — R739 Hyperglycemia, unspecified: Secondary | ICD-10-CM | POA: Diagnosis not present

## 2021-12-15 DIAGNOSIS — Z90710 Acquired absence of both cervix and uterus: Secondary | ICD-10-CM

## 2021-12-15 DIAGNOSIS — I639 Cerebral infarction, unspecified: Secondary | ICD-10-CM | POA: Diagnosis present

## 2021-12-15 LAB — CBC WITH DIFFERENTIAL/PLATELET
Abs Immature Granulocytes: 0.02 10*3/uL (ref 0.00–0.07)
Basophils Absolute: 0 10*3/uL (ref 0.0–0.1)
Basophils Relative: 0 %
Eosinophils Absolute: 0.2 10*3/uL (ref 0.0–0.5)
Eosinophils Relative: 2 %
HCT: 33.6 % — ABNORMAL LOW (ref 36.0–46.0)
Hemoglobin: 10.6 g/dL — ABNORMAL LOW (ref 12.0–15.0)
Immature Granulocytes: 0 %
Lymphocytes Relative: 24 %
Lymphs Abs: 2.1 10*3/uL (ref 0.7–4.0)
MCH: 27.8 pg (ref 26.0–34.0)
MCHC: 31.5 g/dL (ref 30.0–36.0)
MCV: 88.2 fL (ref 80.0–100.0)
Monocytes Absolute: 0.4 10*3/uL (ref 0.1–1.0)
Monocytes Relative: 5 %
Neutro Abs: 6.2 10*3/uL (ref 1.7–7.7)
Neutrophils Relative %: 69 %
Platelets: 347 10*3/uL (ref 150–400)
RBC: 3.81 MIL/uL — ABNORMAL LOW (ref 3.87–5.11)
RDW: 12.9 % (ref 11.5–15.5)
WBC: 9 10*3/uL (ref 4.0–10.5)
nRBC: 0 % (ref 0.0–0.2)

## 2021-12-15 LAB — COMPREHENSIVE METABOLIC PANEL
ALT: 27 U/L (ref 0–44)
AST: 25 U/L (ref 15–41)
Albumin: 3.7 g/dL (ref 3.5–5.0)
Alkaline Phosphatase: 100 U/L (ref 38–126)
Anion gap: 6 (ref 5–15)
BUN: 14 mg/dL (ref 8–23)
CO2: 28 mmol/L (ref 22–32)
Calcium: 9.3 mg/dL (ref 8.9–10.3)
Chloride: 104 mmol/L (ref 98–111)
Creatinine, Ser: 0.68 mg/dL (ref 0.44–1.00)
GFR, Estimated: >60 mL/min (ref >60)
Glucose, Bld: 217 mg/dL — ABNORMAL HIGH (ref 70–99)
Potassium: 3.4 mmol/L — ABNORMAL LOW (ref 3.5–5.1)
Sodium: 138 mmol/L (ref 135–145)
Total Bilirubin: 0.2 mg/dL — ABNORMAL LOW (ref 0.3–1.2)
Total Protein: 7.7 g/dL (ref 6.5–8.1)

## 2021-12-15 LAB — RESP PANEL BY RT-PCR (FLU A&B, COVID) ARPGX2
Influenza A by PCR: NEGATIVE
Influenza B by PCR: NEGATIVE
SARS Coronavirus 2 by RT PCR: NEGATIVE

## 2021-12-15 LAB — GLUCOSE, CAPILLARY
Glucose-Capillary: 156 mg/dL — ABNORMAL HIGH (ref 70–99)
Glucose-Capillary: 128 mg/dL — ABNORMAL HIGH (ref 70–99)

## 2021-12-15 LAB — LACTIC ACID, PLASMA
Lactic Acid, Venous: 1.6 mmol/L (ref 0.5–1.9)
Lactic Acid, Venous: 0.8 mmol/L (ref 0.5–1.9)

## 2021-12-15 MED ORDER — ACETAMINOPHEN 650 MG RE SUPP: 650.0000 mg | Freq: Four times a day (QID) | RECTAL | Status: DC | PRN

## 2021-12-15 MED ORDER — PIPERACILLIN-TAZOBACTAM 3.375 G IVPB 30 MIN
3.3750 g | Freq: Once | INTRAVENOUS | Status: AC
  Administered 2021-12-15: 3.375 g via INTRAVENOUS
  Filled 2021-12-15: qty 50

## 2021-12-15 MED ORDER — ATORVASTATIN CALCIUM 40 MG PO TABS
40.0000 mg | ORAL_TABLET | Freq: Every day | ORAL | Status: DC
Start: 2021-12-15 — End: 2021-12-18
  Administered 2021-12-16 – 2021-12-17 (×2): 40 mg via ORAL
  Filled 2021-12-15 (×2): qty 1

## 2021-12-15 MED ORDER — CLINDAMYCIN PHOSPHATE 600 MG/50ML IV SOLN
600.0000 mg | Freq: Three times a day (TID) | INTRAVENOUS | Status: DC
  Administered 2021-12-15 – 2021-12-16 (×2): 600 mg via INTRAVENOUS
  Filled 2021-12-15 (×4): qty 50

## 2021-12-15 MED ORDER — ONDANSETRON HCL 4 MG PO TABS: 4.0000 mg | ORAL_TABLET | Freq: Four times a day (QID) | ORAL | Status: DC | PRN

## 2021-12-15 MED ORDER — ASPIRIN EC 81 MG PO TBEC
81.0000 mg | DELAYED_RELEASE_TABLET | Freq: Every day | ORAL | Status: DC
  Administered 2021-12-16 – 2021-12-17 (×2): 81 mg via ORAL
  Filled 2021-12-15 (×2): qty 1

## 2021-12-15 MED ORDER — ATENOLOL 50 MG PO TABS
100.0000 mg | ORAL_TABLET | Freq: Every day | ORAL | Status: DC
  Administered 2021-12-16 – 2021-12-17 (×2): 100 mg via ORAL
  Filled 2021-12-15 (×2): qty 2

## 2021-12-15 MED ORDER — HYDROCODONE-ACETAMINOPHEN 5-325 MG PO TABS: 1.0000 | ORAL_TABLET | Freq: Four times a day (QID) | ORAL | Status: DC | PRN

## 2021-12-15 MED ORDER — HYDRALAZINE HCL 50 MG PO TABS
100.0000 mg | ORAL_TABLET | Freq: Three times a day (TID) | ORAL | Status: DC
  Administered 2021-12-15 – 2021-12-17 (×4): 100 mg via ORAL
  Filled 2021-12-15 (×5): qty 2

## 2021-12-15 MED ORDER — ENOXAPARIN SODIUM 40 MG/0.4ML IJ SOSY
40.0000 mg | PREFILLED_SYRINGE | INTRAMUSCULAR | Status: DC
  Administered 2021-12-15 – 2021-12-17 (×2): 40 mg via SUBCUTANEOUS
  Filled 2021-12-15 (×2): qty 0.4

## 2021-12-15 MED ORDER — FERROUS SULFATE 325 (65 FE) MG PO TABS
325.0000 mg | ORAL_TABLET | Freq: Two times a day (BID) | ORAL | Status: DC
  Administered 2021-12-15 – 2021-12-17 (×3): 325 mg via ORAL
  Filled 2021-12-15 (×4): qty 1

## 2021-12-15 MED ORDER — SENNOSIDES-DOCUSATE SODIUM 8.6-50 MG PO TABS
2.0000 | ORAL_TABLET | Freq: Two times a day (BID) | ORAL | Status: DC
  Administered 2021-12-15 – 2021-12-17 (×5): 2 via ORAL
  Filled 2021-12-15 (×5): qty 2

## 2021-12-15 MED ORDER — VANCOMYCIN HCL 1500 MG/300ML IV SOLN
1500.0000 mg | Freq: Once | INTRAVENOUS | Status: AC
  Administered 2021-12-15: 1500 mg via INTRAVENOUS
  Filled 2021-12-15: qty 300

## 2021-12-15 MED ORDER — POLYETHYLENE GLYCOL 3350 17 G PO PACK
17.0000 g | PACK | Freq: Every day | ORAL | Status: DC
  Administered 2021-12-17: 17 g via ORAL
  Filled 2021-12-15: qty 1

## 2021-12-15 MED ORDER — MIRTAZAPINE 15 MG PO TABS
7.5000 mg | ORAL_TABLET | Freq: Every day | ORAL | Status: DC
  Administered 2021-12-15 – 2021-12-16 (×2): 7.5 mg via ORAL
  Filled 2021-12-15 (×2): qty 1

## 2021-12-15 MED ORDER — INSULIN ASPART 100 UNIT/ML IJ SOLN
0.0000 [IU] | Freq: Three times a day (TID) | INTRAMUSCULAR | Status: DC
  Administered 2021-12-15 – 2021-12-17 (×2): 2 [IU] via SUBCUTANEOUS
  Administered 2021-12-17: 1 [IU] via SUBCUTANEOUS
  Filled 2021-12-15: qty 0.09

## 2021-12-15 MED ORDER — CLOPIDOGREL BISULFATE 75 MG PO TABS
75.0000 mg | ORAL_TABLET | Freq: Every day | ORAL | Status: DC
  Administered 2021-12-16 – 2021-12-17 (×2): 75 mg via ORAL
  Filled 2021-12-15 (×3): qty 1

## 2021-12-15 MED ORDER — INSULIN GLARGINE-YFGN 100 UNIT/ML ~~LOC~~ SOLN
13.0000 [IU] | Freq: Every day | SUBCUTANEOUS | Status: DC
  Administered 2021-12-15 – 2021-12-17 (×2): 13 [IU] via SUBCUTANEOUS
  Filled 2021-12-15 (×3): qty 0.13

## 2021-12-15 MED ORDER — INSULIN GLARGINE 100 UNIT/ML SOLOSTAR PEN: 13.0000 [IU] | PEN_INJECTOR | Freq: Every day | SUBCUTANEOUS | Status: DC

## 2021-12-15 MED ORDER — ADULT MULTIVITAMIN W/MINERALS CH
1.0000 | ORAL_TABLET | Freq: Every day | ORAL | Status: DC
  Administered 2021-12-16 – 2021-12-17 (×2): 1 via ORAL
  Filled 2021-12-15 (×2): qty 1

## 2021-12-15 MED ORDER — ONDANSETRON HCL 4 MG/2ML IJ SOLN
4.0000 mg | Freq: Four times a day (QID) | INTRAMUSCULAR | Status: DC | PRN
Start: 2021-12-15 — End: 2021-12-18

## 2021-12-15 MED ORDER — PANTOPRAZOLE SODIUM 20 MG PO TBEC
20.0000 mg | DELAYED_RELEASE_TABLET | Freq: Every day | ORAL | Status: DC
  Administered 2021-12-17: 20 mg via ORAL
  Filled 2021-12-15 (×2): qty 1

## 2021-12-15 MED ORDER — ACETAMINOPHEN 325 MG PO TABS: 650.0000 mg | ORAL_TABLET | Freq: Four times a day (QID) | ORAL | Status: DC | PRN

## 2021-12-15 MED ORDER — POTASSIUM CHLORIDE CRYS ER 20 MEQ PO TBCR
40.0000 meq | EXTENDED_RELEASE_TABLET | Freq: Once | ORAL | Status: AC
  Administered 2021-12-15: 40 meq via ORAL
  Filled 2021-12-15: qty 2

## 2021-12-15 NOTE — Assessment & Plan Note (Deleted)
See #1 °

## 2021-12-15 NOTE — H&P (Signed)
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 Patient is nonambulatory at baseline, was recently being treated with oral Keflex at the facility for ingrown toenail infection but without improvement so sent to the ED for evaluation. Patient complains of pain and tenderness on the right great toe with erythema, refusing to be touched, alert awake oriented but appears poor historian. In the ED patient afebrile routine labs showed blood sugar 217 potassium 3.4 normal lactic acid normal CBC. X-ray of the right great toe showed no evidence of osteomyelitis. Patient was given bolus vancomycin Zosyn and admission was requested for further management.    Assessment/Plan  * Ingrown nail of great toe of right foot- (present on admission) Right great toe cellulitis: Patient was being treated with oral Keflex at the facility without improvement.  We will admit the patient x-ray no evidence of osteomyelitis.  We will consult podiatry keep on broad-spectrum antibiotics pain control.  Cellulitis of right toe- (present on admission) See #1  Hypokalemia We will replete and repeat in a.m.  Cerebrovascular accident [facility](HCC)- (present on admission) History of CVA in 2020, resume home aspirin and Plavix and statin  Hyperglycemia due to type 2 diabetes mellitus (HCC)- (present on admission) Type 2 diabetes with hyperglycemia on long-term insulin: Resume home insulin regimen,  diabetic diet check A1c and monitor.  Normocytic anemia- (present on admission) Hemoglobin is stable.  Monitor  HLD (hyperlipidemia)- (present on admission) Continue her statin  Hypertension- (present on admission) Blood pressure stable resume her home clonidine amlodipine and atenolol  Body mass index is 29.14 kg/m.   Severity of Illness: The appropriate patient status for this patient is OBSERVATION. Observation status is judged to be reasonable and necessary in order to provide the required intensity of service to ensure the patient's safety. The patient's presenting symptoms, physical exam findings, and initial radiographic and laboratory data in the context of their medical condition is felt to place them at decreased risk for further clinical deterioration. Furthermore, it is anticipated that the patient will be medically stable for discharge from the hospital within 2 midnights of admission.    DVT prophylaxis: enoxaparin (LOVENOX) injection 40 mg Start: 12/15/21 1445 Code Status:   Code Status: Full Code  Family Communication: Admission, patients condition and plan of care including tests being ordered have been discussed with the patient  who indicate understanding and agree with the plan and Code Status.  Consults called: PODAITRY   Review of Systems: All systems were reviewed and were negative except as mentioned in HPI above. Negative for fever Negative for chest pain Negative for shortness of breath  Past Medical History:  Diagnosis Date   Chronic lower back pain    Depression    Facial cellulitis 04/17/2018   HLD (hyperlipidemia)    Hypertension    Type II diabetes mellitus [facility](HCC)     Past Surgical History:  Procedure Laterality Date   BUBBLE STUDY  08/06/2019   Procedure: BUBBLE STUDY;  Surgeon: Chrystie NoseHilty, Kenneth C,  MD;  Location: [facility] ENDOSCOPY;  Service: Cardiovascular;;   CATARACT EXTRACTION W/ INTRAOCULAR LENS  IMPLANT, BILATERAL Bilateral    EYE SURGERY  Bilateral    2016   fatty tissue removal from upper back     1998   INGUINAL HERNIA REPAIR Left    INTRAMEDULLARY (IM) NAIL INTERTROCHANTERIC Right 10/17/2019   Procedure: INTRAMEDULLARY (IM) NAIL INTERTROCHANTRIC;  Surgeon: Yolonda Kida, MD;  Location: [facility] OR;  Service: Orthopedics;  Laterality: Right;   MYOMECTOMY     ROBOTIC ASSISTED LAPAROSCOPIC VAGINAL HYSTERECTOMY WITH FIBROID REMOVAL     2009   TEE WITHOUT CARDIOVERSION N/A 08/06/2019   Procedure: TRANSESOPHAGEAL ECHOCARDIOGRAM (TEE);  Surgeon: Chrystie Nose, MD;  Location: [facility] ENDOSCOPY;  Service: Cardiovascular;  Laterality: N/A;     reports that she has never smoked. She has never used smokeless tobacco. She reports that she does not currently use alcohol. She reports that she does not use drugs.  Allergies  Allergen Reactions   Januvia [Sitagliptin] Swelling   Metformin And Related Nausea And Vomiting    Family History  Problem Relation Age of Onset   Hypertension Mother    Pancreatic cancer Brother    Stroke Neg Hx      Prior to Admission medications   Medication Sig Start Date End Date Taking? Authorizing Provider  amLODipine (NORVASC) 10 MG tablet Take 10 mg by mouth daily. 03/23/18  Yes [provider]  aspirin EC 81 MG EC tablet Take 1 tablet (81 mg total) by mouth daily. 08/11/19  Yes Berton Mount I, MD  atenolol (TENORMIN) 100 MG tablet Take 1 tablet by mouth daily 12/20/18  Yes Patwardhan, Manish J, MD  atorvastatin (LIPITOR) 40 MG tablet Take 1 tablet (40 mg total) by mouth daily. 08/11/19  Yes Barnetta Chapel, MD  B Complex-C (B-COMPLEX WITH VITAMIN C) tablet Take 1 tablet by mouth daily. 08/11/19  Yes Berton Mount I, MD  cloNIDine (CATAPRES - DOSED IN MG/24 HR) 0.2 mg/24hr patch Place 0.2 mg onto the skin once a week. Saturday   Yes [provider]  clopidogrel (PLAVIX) 75 MG tablet Take 1 tablet (75 mg total) by mouth daily. 08/11/19  Yes Berton Mount I, MD   diclofenac Sodium (VOLTAREN) 1 % GEL Apply 2 g topically 2 (two) times daily.   Yes [provider]  Emollient (EMOLLIA-LOTION EX) Apply 1 application topically 2 (two) times daily. To hands   Yes [provider]  Ensure (ENSURE) Take 237 mLs by mouth daily.   Yes [provider]  ferrous sulfate 325 (65 FE) MG tablet Take 1 tablet (325 mg total) by mouth 2 (two) times daily with a meal. 10/24/19  Yes Berton Mount I, MD  hydrALAZINE (APRESOLINE) 100 MG tablet Take 1 tablet (100 mg total) by mouth every 8 (eight) hours. 08/10/19  Yes Barnetta Chapel, MD  LANTUS SOLOSTAR 100 UNIT/ML Solostar Pen Inject 13 Units into the skin daily. 11/15/21  Yes [provider]  mirtazapine (REMERON) 7.5 MG tablet Take 7.5 mg by mouth at bedtime. 12/04/21  Yes [provider]  Multiple Vitamin (MULTIVITAMIN WITH MINERALS) TABS tablet Take 1 tablet by mouth daily. 10/25/19  Yes Barnetta Chapel, MD  OVER THE COUNTER MEDICATION Take 1 Can by mouth 2 (two) times daily. Health Shake for Nutritional supplement   Yes [provider]  pantoprazole (PROTONIX) 20 MG tablet Take 20 mg by mouth daily.   Yes [provider]  polyethylene glycol (MIRALAX /  GLYCOLAX) 17 g packet Take 17 g by mouth daily. 10/25/19  Yes Barnetta Chapel, MD  senna-docusate (SENOKOT-S) 8.6-50 MG tablet Take 2 tablets by mouth 2 (two) times daily. 10/24/19  Yes Berton Mount I, MD  enoxaparin (LOVENOX) 40 MG/0.4ML injection Inject 0.4 mLs (40 mg total) into the skin daily for 21 days. 10/24/19 11/14/19  Barnetta Chapel, MD  HYDROcodone-acetaminophen (NORCO/VICODIN) 5-325 MG tablet Take 1 tablet by mouth every 6 (six) hours as needed for moderate pain. 10/24/19   Berton Mount I, MD  insulin aspart (NOVOLOG) 100 UNIT/ML injection Inject 0-15 Units into the skin 3 (three) times daily with meals. 10/24/19   Barnetta Chapel, MD  megestrol (MEGACE) 400 MG/10ML suspension  Take 10 mLs (400 mg total) by mouth 2 (two) times daily. 10/24/19   Barnetta Chapel, MD    Physical Exam: Vitals:   12/15/21 1225 12/15/21 1302 12/15/21 1334  BP: (!) 155/78  127/63  Pulse: 72  66  Resp: 18  16  Temp: 97.7 F (36.5 C)    TempSrc: Oral    SpO2: 100%  100%  Weight:  77 kg   Height:  5\' 4"  (1.626 m)     General exam: AAOx3, pleasant, NAD, weak appearing. HEENT:Oral mucosa moist, Ear/Nose WNL grossly, dentition normal. Respiratory system: bilaterally clear,no wheezing or crackles,no use of accessory muscle Cardiovascular system: S1 & S2 +, No JVD,. Gastrointestinal system: Abdomen soft, NT,ND, BS+ Nervous System:Alert, awake, oriented x3 Extremities: No edema, distal peripheral pulses palpable. Rt grt toe with redness tenderness see pic Skin: No rashes,no icterus. MSK: Normal muscle bulk,tone, power     Labs on Admission: I have personally reviewed following labs and imaging studies  CBC: Recent Labs  Lab 12/15/21 1229  WBC 9.0  NEUTROABS 6.2  HGB 10.6*  HCT 33.6*  MCV 88.2  PLT 347   Basic Metabolic Panel: Recent Labs  Lab 12/15/21 1229  NA 138  K 3.4*  CL 104  CO2 28  GLUCOSE 217*  BUN 14  CREATININE 0.68  CALCIUM 9.3   GFR: Estimated Creatinine Clearance: 71.3 mL/min (by C-G formula based on SCr of 0.68 mg/dL). Liver Function Tests: Recent Labs  Lab 12/15/21 1229  AST 25  ALT 27  ALKPHOS 100  BILITOT 0.2*  PROT 7.7  ALBUMIN 3.7   No results for input(s): LIPASE, AMYLASE in the last 168 hours. No results for input(s): AMMONIA in the last 168 hours. Coagulation Profile: No results for input(s): INR, PROTIME in the last 168 hours. Cardiac Enzymes: No results for input(s): CKTOTAL, CKMB, CKMBINDEX, TROPONINI in the last 168 hours. BNP (last 3 results) No results for input(s): PROBNP in the last 8760 hours. HbA1C: No results for input(s): HGBA1C in the last 72 hours. CBG: No results for input(s): GLUCAP in the last 168  hours. Lipid Profile: No results for input(s): CHOL, HDL, LDLCALC, TRIG, CHOLHDL, LDLDIRECT in the last 72 hours. Thyroid Function Tests: No results for input(s): TSH, T4TOTAL, FREET4, T3FREE, THYROIDAB in the last 72 hours. Anemia Panel: No results for input(s): VITAMINB12, FOLATE, FERRITIN, TIBC, IRON, RETICCTPCT in the last 72 hours. Urine analysis:    Component Value Date/Time   COLORURINE YELLOW 10/17/2019 1200   APPEARANCEUR CLEAR 10/17/2019 1200   LABSPEC 1.021 10/17/2019 1200   PHURINE 6.0 10/17/2019 1200   GLUCOSEU NEGATIVE 10/17/2019 1200   HGBUR NEGATIVE 10/17/2019 1200   BILIRUBINUR NEGATIVE 10/17/2019 1200   KETONESUR NEGATIVE 10/17/2019 1200   PROTEINUR 100 (A) 10/17/2019  1200   NITRITE NEGATIVE 10/17/2019 1200   LEUKOCYTESUR NEGATIVE 10/17/2019 1200    Radiological Exams on Admission: DG Toe Great Right  Result Date: 12/15/2021 CLINICAL DATA:  66 year old female with concern for osteomyelitis. EXAM: RIGHT GREAT TOE COMPARISON:  None. FINDINGS: There is no evidence of fracture or dislocation. There is no evidence of arthropathy or other focal bone abnormality. Diffuse osteopenia. Soft tissues are unremarkable. IMPRESSION: No definite radiographic evidence of osteomyelitis. Recommend MRI if clinical suspicion for osteomyelitis persists. Electronically Signed   By: Marliss Coots M.D.   On: 12/15/2021 13:26      Lanae Boast MD [facility]  If 7PM-7AM, please contact night-coverage [email or web address]  12/15/2021, 2:33 PM

## 2021-12-15 NOTE — Progress Notes (Signed)
A consult was received from an ED physician for vancomycin per pharmacy dosing.  The patient's profile has been reviewed for ht/wt/allergies/indication/available labs.   A one time order has been placed for vancomycin 1500 mg.    Further antibiotics/pharmacy consults should be ordered by admitting physician if indicated.                       Thank you,  Herby Abraham, Pharm.D 12/15/2021 12:59 PM

## 2021-12-15 NOTE — ED Triage Notes (Signed)
Pt BIB EMS from [place] home. Pt started on antibiotic therapy from Feb3 to Feb 11 for toe infection. NP on staff reported right ingrown toe infection has not improved. Hx of diabetes, A&O x4, denies pain  BP 184/96 P 78 RR 16 T 97.5 spO2 98% RA CBG 278

## 2021-12-15 NOTE — Assessment & Plan Note (Signed)
Continue her statin. 

## 2021-12-15 NOTE — Assessment & Plan Note (Addendum)
Replete

## 2021-12-15 NOTE — Assessment & Plan Note (Addendum)
Stable, cont clonidine amlodipine and atenolol

## 2021-12-15 NOTE — H&P (View-Only) (Signed)
Reason for Consult:Cellulitis, ingrown toenail Referring Physician: Dr. Lanae Boast, MD  Lauren Waller is an 66 y.o. female.  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 Patient is nonambulatory at baseline, was recently being treated with oral Keflex at the facility for ingrown toenail infection but without improvement so sent to the ED for evaluation.  Upon admission started on clindamycin and vancomycin.    Past Medical History:  Diagnosis Date   Chronic lower back pain    Depression    Facial cellulitis 04/17/2018   HLD (hyperlipidemia)    Hypertension    Type II diabetes mellitus (HCC)     Past Surgical History:  Procedure Laterality Date   BUBBLE STUDY  08/06/2019   Procedure: BUBBLE STUDY;  Surgeon: Chrystie Nose, MD;  Location: [facility] ENDOSCOPY;  Service: Cardiovascular;;   CATARACT EXTRACTION W/ INTRAOCULAR LENS  IMPLANT, BILATERAL Bilateral    EYE SURGERY Bilateral    2016   fatty tissue removal from upper back     1998   INGUINAL HERNIA REPAIR Left    INTRAMEDULLARY (IM) NAIL INTERTROCHANTERIC Right 10/17/2019   Procedure: INTRAMEDULLARY (IM) NAIL INTERTROCHANTRIC;  Surgeon: Yolonda Kida, MD;  Location: [facility] OR;  Service: Orthopedics;  Laterality: Right;   MYOMECTOMY     ROBOTIC ASSISTED LAPAROSCOPIC VAGINAL HYSTERECTOMY WITH FIBROID REMOVAL     2009   TEE WITHOUT CARDIOVERSION N/A 08/06/2019   Procedure: TRANSESOPHAGEAL ECHOCARDIOGRAM (TEE);  Surgeon: Chrystie Nose, MD;  Location: [facility] ENDOSCOPY;  Service: Cardiovascular;  Laterality: N/A;    Family History  Problem Relation Age of Onset   Hypertension Mother     Pancreatic cancer Brother    Stroke Neg Hx     Social History:  reports that she has never smoked. She has never used smokeless tobacco. She reports that she does not currently use alcohol. She reports that she does not use drugs.  Allergies:  Allergies  Allergen Reactions   Januvia [Sitagliptin] Swelling   Metformin And Related Nausea And Vomiting    Medications: I have reviewed the patient's current medications.  Results for orders placed or performed during the hospital encounter of 12/15/21 (from the past 48 hour(s))  Lactic acid, plasma     Status: None   Collection Time: 12/15/21 12:29 PM  Result Value Ref Range   Lactic Acid, Venous 1.6 0.5 - 1.9 mmol/L    Comment: Performed at [facility], [address] [place], [place] [address]  Comprehensive metabolic panel     Status: Abnormal   Collection Time: 12/15/21 12:29 PM  Result Value Ref Range   Sodium 138 135 - 145 mmol/L   Potassium 3.4 (L) 3.5 - 5.1 mmol/L   Chloride 104 98 - 111 mmol/L   CO2 28 22 - 32 mmol/L   Glucose, Bld 217 (H) 70 - 99 mg/dL    Comment: Glucose reference range applies only to samples taken after fasting for at least 8 hours.   BUN 14 8 - 23 mg/dL   Creatinine, Ser 0.03 0.44 - 1.00 mg/dL   Calcium 9.3 8.9 - 70.4 mg/dL   Total Protein 7.7 6.5 - 8.1 g/dL   Albumin 3.7 3.5 - 5.0 g/dL  AST 25 15 - 41 U/L   ALT 27 0 - 44 U/L   Alkaline Phosphatase 100 38 - 126 U/L   Total Bilirubin 0.2 (L) 0.3 - 1.2 mg/dL   GFR, Estimated >65 >03 mL/min    Comment: (NOTE) Calculated using the CKD-EPI Creatinine Equation (2021)    Anion gap 6 5 - 15    Comment: Performed at [facility], [address], [place], [place] [address]  CBC with Differential     Status: Abnormal   Collection Time: 12/15/21 12:29 PM  Result Value Ref Range   WBC 9.0 4.0 - 10.5 K/uL   RBC 3.81 (L) 3.87 - 5.11 MIL/uL   Hemoglobin 10.6 (L) 12.0 - 15.0 g/dL   HCT 81.2 (L) 75.1 - 70.0 %   MCV  88.2 80.0 - 100.0 fL   MCH 27.8 26.0 - 34.0 pg   MCHC 31.5 30.0 - 36.0 g/dL   RDW 17.4 94.4 - 96.7 %   Platelets 347 150 - 400 K/uL   nRBC 0.0 0.0 - 0.2 %   Neutrophils Relative % 69 %   Neutro Abs 6.2 1.7 - 7.7 K/uL   Lymphocytes Relative 24 %   Lymphs Abs 2.1 0.7 - 4.0 K/uL   Monocytes Relative 5 %   Monocytes Absolute 0.4 0.1 - 1.0 K/uL   Eosinophils Relative 2 %   Eosinophils Absolute 0.2 0.0 - 0.5 K/uL   Basophils Relative 0 %   Basophils Absolute 0.0 0.0 - 0.1 K/uL   Immature Granulocytes 0 %   Abs Immature Granulocytes 0.02 0.00 - 0.07 K/uL    Comment: Performed at [facility], [address] [place], [place] [address]  Resp Panel by RT-PCR (Flu A&B, Covid) Nasopharyngeal Swab     Status: None   Collection Time: 12/15/21  1:52 PM   Specimen: Nasopharyngeal Swab; Nasopharyngeal(NP) swabs in vial transport medium  Result Value Ref Range   SARS Coronavirus 2 by RT PCR NEGATIVE NEGATIVE    Comment: (NOTE) SARS-CoV-2 target nucleic acids are NOT DETECTED.  The SARS-CoV-2 RNA is generally detectable in upper respiratory specimens during the acute phase of infection. The lowest concentration of SARS-CoV-2 viral copies this assay can detect is 138 copies/mL. A negative result does not preclude SARS-Cov-2 infection and should not be used as the sole basis for treatment or other patient management decisions. A negative result may occur with  improper specimen collection/handling, submission of specimen other than nasopharyngeal swab, presence of viral mutation(s) within the areas targeted by this assay, and inadequate number of viral copies(<138 copies/mL). A negative result must be combined with clinical observations, patient history, and epidemiological information. The expected result is Negative.  Fact Sheet for Patients:  BloggerCourse.com  Fact Sheet for Healthcare Providers:  SeriousBroker.it  This  test is no t yet approved or cleared by the [place] FDA and  has been authorized for detection and/or diagnosis of SARS-CoV-2 by FDA under an Emergency Use Authorization (EUA). This EUA will remain  in effect (meaning this test can be used) for the duration of the COVID-19 declaration under Section 564(b)(1) of the Act, 21 U.S.C.section 360bbb-3(b)(1), unless the authorization is terminated  or revoked sooner.       Influenza A by PCR NEGATIVE NEGATIVE   Influenza B by PCR NEGATIVE NEGATIVE    Comment: (NOTE) The Xpert Xpress SARS-CoV-2/FLU/RSV plus assay is intended as an aid in the diagnosis of influenza from Nasopharyngeal swab specimens and should not  be used as a sole basis for treatment. Nasal washings and aspirates are unacceptable for Xpert Xpress SARS-CoV-2/FLU/RSV testing.  Fact Sheet for Patients: BloggerCourse.com  Fact Sheet for Healthcare Providers: SeriousBroker.it  This test is not yet approved or cleared by the [place] FDA and has been authorized for detection and/or diagnosis of SARS-CoV-2 by FDA under an Emergency Use Authorization (EUA). This EUA will remain in effect (meaning this test can be used) for the duration of the COVID-19 declaration under Section 564(b)(1) of the Act, 21 U.S.C. section 360bbb-3(b)(1), unless the authorization is terminated or revoked.  Performed at [facility], [address] [place], [place] [address]     DG Toe Great Right  Result Date: 12/15/2021 CLINICAL DATA:  66 year old female with concern for osteomyelitis. EXAM: RIGHT GREAT TOE COMPARISON:  None. FINDINGS: There is no evidence of fracture or dislocation. There is no evidence of arthropathy or other focal bone abnormality. Diffuse osteopenia. Soft tissues are unremarkable. IMPRESSION: No definite radiographic evidence of osteomyelitis. Recommend MRI if clinical suspicion for osteomyelitis  persists. Electronically Signed   By: Marliss Coots M.D.   On: 12/15/2021 13:26    Review of Systems Blood pressure 130/61, pulse 69, temperature 97.7 F (36.5 C), temperature source Oral, resp. rate 16, height 5\' 4"  (1.626 m), weight 77 kg, SpO2 100 %. Physical Exam General: AAO x3, NAD  Dermatological: The right hallux toenail is hypertrophic, dystrophic with incurvation present on the both the medial and lateral nail borders.  The nail is loose from the nailbed.  The nail is quite tender to touch.  There is edema and erythema on the nail.  There is also incurvation present on the medial aspect of the second digit toenail on the right side with localized edema and erythema.         Vascular: Dorsalis Pedis artery and Posterior Tibial artery pedal pulses are palpable bilateral with immedate capillary fill time. There is no pain with calf compression, swelling, warmth, erythema.   Neruologic: Grossly intact via light touch bilateral.   Musculoskeletal: Quite a bit of tenderness to the right hallux toenail.  Muscular strength 5/5 in all groups tested bilateral.   Assessment/Plan: Ingrown toenail, cellulitis  -Xray reviewed. No signs of osteomyelitis.  Continue IV antibiotics.  The patient continued to have the nail removed as well as at least the medial border of the second digit toenail on the right side.  I discussed trying to sit bedside with a digital block and nail removal.  She seemed quite scared of this today.  We discussed with her tomorrow if not able to visit bedside we will plan on removal in the OR, wound debridement on Friday.  Podiatry will continue to follow.  Sunday 12/15/2021, 2:53 PM

## 2021-12-15 NOTE — Assessment & Plan Note (Addendum)
Right great toe cellulitis and abscess: X-ray no signs of osteomyelitis, did not improve with oral Keflex at nursing home. Seen by podiatry-underwent irrigation and debridement removal of the first and second toenails 2/23, wound cultured-treated with  Vancomycin.seen by podiatry.  Advised to discharge on oral antibiotics x10 days, continue wound care.  Wound culture from the OR are pending.

## 2021-12-15 NOTE — ED Notes (Signed)
Unsuccessful IV attempt in R forearm, 20g.

## 2021-12-15 NOTE — Assessment & Plan Note (Addendum)
Type 2 diabetes with hyperglycemia on long-term insulin: Blood sugar is controlled continue home long-acting insulin sliding scale insulin, A1c 7.8.  Borderline Recent Labs  Lab 12/16/21 0325 12/16/21 0802 12/16/21 1647 12/16/21 1806 12/16/21 1850 12/16/21 2135 12/17/21 0718  GLUCAP  --    < > 98 105* 101* 201* 112*  HGBA1C 7.8*  --   --   --   --   --   --    < > = values in this interval not displayed.

## 2021-12-15 NOTE — Assessment & Plan Note (Addendum)
History of CVA in 2020, cont her aspirin and Plavix and statin

## 2021-12-15 NOTE — ED Provider Notes (Signed)
[facility]-EMERGENCY DEPT Provider Note   CSN: [id number] Arrival date & time: 12/15/21  1214     History  Chief Complaint  Patient presents with   Nail Problem    Lauren Waller is a 66 y.o. female with history of HTN, HLD, type 2 diabetes, CVA with residual deficit who presents emergency department from nursing home for right great toe infection. Staff at [facility] had concern for infection from an ingrown toenail on the right great toe. Pt was placed on antibiotics from 2/3-2/11. The provider at the facility reported the patient's toe has not improved.   HPI    Past Medical History:  Diagnosis Date   Chronic lower back pain    Depression    Facial cellulitis 04/17/2018   HLD (hyperlipidemia)    Hypertension    Type II diabetes mellitus (HCC)     Home Medications Prior to Admission medications   Medication Sig Start Date End Date Taking? Authorizing Provider  acetaminophen (TYLENOL) 325 MG tablet Take 650 mg by mouth every 6 (six) hours as needed for mild pain or fever.   Yes [provider]  amLODipine (NORVASC) 10 MG tablet Take 10 mg by mouth daily. 03/23/18  Yes [provider]  aspirin EC 81 MG EC tablet Take 1 tablet (81 mg total) by mouth daily. 08/11/19  Yes Berton Mount I, MD  atenolol (TENORMIN) 100 MG tablet Take 1 tablet by mouth daily Patient taking differently: Take 100 mg by mouth daily. 12/20/18  Yes Patwardhan, Manish J, MD  atorvastatin (LIPITOR) 40 MG tablet Take 1 tablet (40 mg total) by mouth daily. 08/11/19  Yes Barnetta Chapel, MD  B Complex-C (B-COMPLEX WITH VITAMIN C) tablet Take 1 tablet by mouth daily. 08/11/19  Yes Berton Mount I, MD  cloNIDine (CATAPRES - DOSED IN MG/24 HR) 0.2 mg/24hr patch Place 0.2 mg onto the skin once a week. Saturday   Yes [provider]  clopidogrel (PLAVIX) 75 MG tablet Take 1 tablet (75 mg total) by mouth daily. 08/11/19  Yes Berton Mount I, MD  diclofenac Sodium (VOLTAREN) 1 % GEL Apply 2 g topically 2 (two) times daily.   Yes [provider]  Emollient (EMOLLIA-LOTION EX) Apply 1 application topically 2 (two) times daily. To hands   Yes [provider]  Ensure (ENSURE) Take 237 mLs by mouth daily.   Yes [provider]  ferrous sulfate 325 (65 FE) MG tablet Take 1 tablet (325 mg total) by mouth 2 (two) times daily with a meal. 10/24/19  Yes Ogbata, Lafayette Dragon, MD  HUMALOG KWIKPEN 100 UNIT/ML KwikPen Inject 0-5 Units into the skin 3 (three) times daily with meals. CBG = 0-100 = 0 units CBG 101-400 = 5 unts 11/15/21  Yes [provider]  hydrALAZINE (APRESOLINE) 100 MG tablet Take 1 tablet (100 mg total) by mouth every 8 (eight) hours. 08/10/19  Yes Barnetta Chapel, MD  HYDROcodone-acetaminophen (NORCO/VICODIN) 5-325 MG tablet Take 1 tablet by mouth every 6 (six) hours as needed for moderate pain. 10/24/19  Yes Barnetta Chapel, MD  LANTUS SOLOSTAR 100 UNIT/ML Solostar Pen Inject 13 Units into the skin daily. 11/15/21  Yes [provider]  mirtazapine (REMERON) 7.5 MG tablet Take 7.5 mg by mouth at bedtime. 12/04/21  Yes [provider]  Multiple Vitamin (MULTIVITAMIN WITH MINERALS) TABS tablet Take 1 tablet by mouth daily. 10/25/19  Yes Barnetta Chapel, MD  OVER THE COUNTER MEDICATION Take [phone]  mLs by mouth 2 (two) times daily. Health Shake for Nutritional supplement   Yes [provider]  pantoprazole (PROTONIX) 20 MG tablet Take 20 mg by mouth daily.   Yes [provider]  polyethylene glycol (MIRALAX / GLYCOLAX) 17 g packet Take 17 g by mouth daily. 10/25/19  Yes Barnetta Chapelgbata, Sylvester I, MD  senna-docusate (SENOKOT-S) 8.6-50 MG tablet Take 2 tablets by mouth 2 (two) times daily. 10/24/19  Yes Barnetta Chapelgbata, Sylvester I, MD      Allergies    Januvia [sitagliptin] and Metformin and related    Review of Systems   Review of Systems  Constitutional:   Negative for chills and fever.  Skin:  Positive for wound.  All other systems reviewed and are negative.  Physical Exam Updated Vital Signs BP 130/61    Pulse 69    Temp 97.7 F (36.5 C) (Oral)    Resp 16    Ht 5\' 4"  (1.626 m)    Wt 77 kg    SpO2 100%    BMI 29.14 kg/m  Physical Exam Vitals and nursing note reviewed.  Constitutional:      Appearance: Normal appearance.  HENT:     Head: Normocephalic and atraumatic.  Eyes:     Conjunctiva/sclera: Conjunctivae normal.  Cardiovascular:     Rate and Rhythm: Normal rate and regular rhythm.  Pulmonary:     Effort: Pulmonary effort is normal. No respiratory distress.     Breath sounds: Normal breath sounds.  Abdominal:     General: There is no distension.     Palpations: Abdomen is soft.     Tenderness: There is no abdominal tenderness.  Skin:    General: Skin is warm and dry.     Comments: Paronychia of right great toe with surrounding erythema and purulence, no necrosis. Full ROM of all toes.   Neurological:     General: No focal deficit present.     Mental Status: She is alert.    ED Results / Procedures / Treatments   Labs (all labs ordered are listed, but only abnormal results are displayed) Labs Reviewed  COMPREHENSIVE METABOLIC PANEL - Abnormal; Notable for the following components:      Result Value   Potassium 3.4 (*)    Glucose, Bld 217 (*)    Total Bilirubin 0.2 (*)    All other components within normal limits  CBC WITH DIFFERENTIAL/PLATELET - Abnormal; Notable for the following components:   RBC 3.81 (*)    Hemoglobin 10.6 (*)    HCT 33.6 (*)    All other components within normal limits  RESP PANEL BY RT-PCR (FLU A&B, COVID) ARPGX2  CULTURE, BLOOD (ROUTINE X 2)  CULTURE, BLOOD (ROUTINE X 2)  LACTIC ACID, PLASMA  LACTIC ACID, PLASMA  HIV ANTIBODY (ROUTINE TESTING W REFLEX)    EKG None  Radiology DG Toe Great Right  Result Date: 12/15/2021 CLINICAL DATA:  66 year old female with concern for  osteomyelitis. EXAM: RIGHT GREAT TOE COMPARISON:  None. FINDINGS: There is no evidence of fracture or dislocation. There is no evidence of arthropathy or other focal bone abnormality. Diffuse osteopenia. Soft tissues are unremarkable. IMPRESSION: No definite radiographic evidence of osteomyelitis. Recommend MRI if clinical suspicion for osteomyelitis persists. Electronically Signed   By: Marliss Cootsylan  Suttle M.D.   On: 12/15/2021 13:26    Procedures .Critical Care Performed by: Su Monksoemhildt, Ciara Kagan T, PA-C Authorized by: Su Monksoemhildt, Tyeson Tanimoto T, PA-C   Critical care provider statement:    Critical care time (minutes):  30   Critical care was necessary to treat or prevent imminent or life-threatening deterioration of the following conditions:  Sepsis   Critical care was time spent personally by me on the following activities:  Development of treatment plan with patient or surrogate, discussions with consultants, evaluation of patient's response to treatment, examination of patient, ordering and review of laboratory studies, ordering and review of radiographic studies, ordering and performing treatments and interventions, pulse oximetry, re-evaluation of patient's condition and review of old charts    Medications Ordered in ED Medications  vancomycin (VANCOREADY) IVPB 1500 mg/300 mL (1,500 mg Intravenous New Bag/Given 12/15/21 1340)  clindamycin (CLEOCIN) IVPB 600 mg (has no administration in time range)  enoxaparin (LOVENOX) injection 40 mg (has no administration in time range)  acetaminophen (TYLENOL) tablet 650 mg (has no administration in time range)    Or  acetaminophen (TYLENOL) suppository 650 mg (has no administration in time range)  ondansetron (ZOFRAN) tablet 4 mg (has no administration in time range)    Or  ondansetron (ZOFRAN) injection 4 mg (has no administration in time range)  insulin aspart (novoLOG) injection 0-9 Units (has no administration in time range)  potassium chloride SA (KLOR-CON M)  CR tablet 40 mEq (has no administration in time range)  atenolol (TENORMIN) tablet 100 mg (has no administration in time range)  aspirin EC tablet 81 mg (has no administration in time range)  atorvastatin (LIPITOR) tablet 40 mg (has no administration in time range)  clopidogrel (PLAVIX) tablet 75 mg (has no administration in time range)  ferrous sulfate tablet 325 mg (has no administration in time range)  hydrALAZINE (APRESOLINE) tablet 100 mg (has no administration in time range)  HYDROcodone-acetaminophen (NORCO/VICODIN) 5-325 MG per tablet 1 tablet (has no administration in time range)  insulin glargine (LANTUS) Solostar Pen 13 Units (has no administration in time range)  mirtazapine (REMERON) tablet 7.5 mg (has no administration in time range)  multivitamin with minerals tablet 1 tablet (has no administration in time range)  pantoprazole (PROTONIX) EC tablet 20 mg (has no administration in time range)  polyethylene glycol (MIRALAX / GLYCOLAX) packet 17 g (has no administration in time range)  senna-docusate (Senokot-S) tablet 2 tablet (has no administration in time range)  piperacillin-tazobactam (ZOSYN) IVPB 3.375 g (0 g Intravenous Stopped 12/15/21 1440)    ED Course/ Medical Decision Making/ A&P                           Medical Decision Making Amount and/or Complexity of Data Reviewed Labs: ordered. Radiology: ordered.  Risk Prescription drug management. Decision regarding hospitalization.   This patient presents to the ED for concern of nail problem, this involves an extensive number of treatment options, and is a complaint that carries with it a high risk of complications and morbidity. The emergent differential diagnosis prior to evaluation includes, but is not limited to,  paronychia, ingrown toenail, abscess, diabetic foot infection, sepsis, ischemia.   Past Medical History / Co-morbidities: HTN, HLD, T2DM  Physical Exam: Physical exam performed. The pertinent findings  include: Paronychia of right great toe with surrounding erythema and purulence, no necrosis.  Lab Tests: I ordered, and personally interpreted labs.  The pertinent results include:  no leukocytosis, blood glucose 217, hemoglobin of 10.6 improved compared to prior, lactic acid 1.6.    Imaging Studies: I ordered imaging studies including right great toe x-ray. I independently visualized and interpreted imaging which showed no definite radiologic evidence of  osteomyelitis. I agree with the radiologist interpretation.   Medications: I ordered medication including IV antibiotics  for diabetic foot wound. I have reviewed the patients home medicines and have made adjustments as needed.  Consultations Obtained: I requested consultation with the hospitalist Dr. Dayna Barker,  and discussed lab and imaging findings as well as pertinent plan - they recommend: hospitalization for diabetic foot wound after failure of oral antibiotics.    Disposition: After consideration of the diagnostic results and the patients response to treatment, I feel that patient requires admission for IV antibiotics. The patient appears reasonably stabilized for admission considering the current resources, flow, and capabilities available in the ED at this time, and I doubt any other [facility] requiring further screening and/or treatment in the ED prior to admission.    Final Clinical Impression(s) / ED Diagnoses Final diagnoses:  Diabetic foot infection (HCC)    Rx / DC Orders ED Discharge Orders     None      Portions of this report may have been transcribed using voice recognition software. Every effort was made to ensure accuracy; however, inadvertent computerized transcription errors may be present.    Jeanella Flattery 12/15/21 1523    Lorre Nick, MD 12/16/21 [phone]

## 2021-12-15 NOTE — Assessment & Plan Note (Signed)
Hemoglobin is stable.  Monitor. 

## 2021-12-15 NOTE — Hospital Course (Addendum)
66 year old female  with multiple comorbidities with history of CVA October/2020 following which she has been in nursing home, type 2 diabetes mellitus on long-term insulin, hypertension, hyperlipidemia, depression, low back pain, right femur fracture in December/2020 chronic anemia sent from nursing home to the ED for evaluation of worsening infection of the right great toe. Patient is nonambulatory at baseline, was recently being treated with oral Keflex at the facility for ingrown toenail infection but without improvement so sent to the ED for evaluation. Patient complains of pain and tenderness on the right great toe with erythema, refusing to be touched, alert awake oriented but appears poor historian. In the ED patient afebrile routine labs showed blood sugar 217 potassium 3.4 normal lactic acid normal CBC. X-ray of the right great toe showed no evidence of osteomyelitis.patient was started on antibiotics and admitted for further management.  Seen by podiatry-underwent irrigation and debridement removal of the first and second toenails 2/23, wound cultured.  Patient feels significantly improved after the surgery.  Requesting walker/cane for NH Seen by podiatry in postop follow-up, okay for discharge to nursing home on oral antibiotics x10 days and follow-up outpatient

## 2021-12-15 NOTE — Consult Note (Signed)
Reason for Consult:Cellulitis, ingrown toenail Referring Physician: Dr. Lanae Boast, MD  Lauren Waller is an 66 y.o. female.  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 Patient is nonambulatory at baseline, was recently being treated with oral Keflex at the facility for ingrown toenail infection but without improvement so sent to the ED for evaluation.  Upon admission started on clindamycin and vancomycin.    Past Medical History:  Diagnosis Date   Chronic lower back pain    Depression    Facial cellulitis 04/17/2018   HLD (hyperlipidemia)    Hypertension    Type II diabetes mellitus (HCC)     Past Surgical History:  Procedure Laterality Date   BUBBLE STUDY  08/06/2019   Procedure: BUBBLE STUDY;  Surgeon: Chrystie Nose, MD;  Location: [facility] ENDOSCOPY;  Service: Cardiovascular;;   CATARACT EXTRACTION W/ INTRAOCULAR LENS  IMPLANT, BILATERAL Bilateral    EYE SURGERY Bilateral    2016   fatty tissue removal from upper back     1998   INGUINAL HERNIA REPAIR Left    INTRAMEDULLARY (IM) NAIL INTERTROCHANTERIC Right 10/17/2019   Procedure: INTRAMEDULLARY (IM) NAIL INTERTROCHANTRIC;  Surgeon: Yolonda Kida, MD;  Location: [facility] OR;  Service: Orthopedics;  Laterality: Right;   MYOMECTOMY     ROBOTIC ASSISTED LAPAROSCOPIC VAGINAL HYSTERECTOMY WITH FIBROID REMOVAL     2009   TEE WITHOUT CARDIOVERSION N/A 08/06/2019   Procedure: TRANSESOPHAGEAL ECHOCARDIOGRAM (TEE);  Surgeon: Chrystie Nose, MD;  Location: [facility] ENDOSCOPY;  Service: Cardiovascular;  Laterality: N/A;    Family History  Problem Relation Age of Onset   Hypertension Mother     Pancreatic cancer Brother    Stroke Neg Hx     Social History:  reports that she has never smoked. She has never used smokeless tobacco. She reports that she does not currently use alcohol. She reports that she does not use drugs.  Allergies:  Allergies  Allergen Reactions   Januvia [Sitagliptin] Swelling   Metformin And Related Nausea And Vomiting    Medications: I have reviewed the patient's current medications.  Results for orders placed or performed during the hospital encounter of 12/15/21 (from the past 48 hour(s))  Lactic acid, plasma     Status: None   Collection Time: 12/15/21 12:29 PM  Result Value Ref Range   Lactic Acid, Venous 1.6 0.5 - 1.9 mmol/L    Comment: Performed at [facility], [address] [place], [place] [address]  Comprehensive metabolic panel     Status: Abnormal   Collection Time: 12/15/21 12:29 PM  Result Value Ref Range   Sodium 138 135 - 145 mmol/L   Potassium 3.4 (L) 3.5 - 5.1 mmol/L   Chloride 104 98 - 111 mmol/L   CO2 28 22 - 32 mmol/L   Glucose, Bld 217 (H) 70 - 99 mg/dL    Comment: Glucose reference range applies only to samples taken after fasting for at least 8 hours.   BUN 14 8 - 23 mg/dL   Creatinine, Ser 0.03 0.44 - 1.00 mg/dL   Calcium 9.3 8.9 - 70.4 mg/dL   Total Protein 7.7 6.5 - 8.1 g/dL   Albumin 3.7 3.5 - 5.0 g/dL  AST 25 15 - 41 U/L   ALT 27 0 - 44 U/L   Alkaline Phosphatase 100 38 - 126 U/L   Total Bilirubin 0.2 (L) 0.3 - 1.2 mg/dL   GFR, Estimated >45>60 >40>60 mL/min    Comment: (NOTE) Calculated using the CKD-EPI Creatinine Equation (2021)    Anion gap 6 5 - 15    Comment: Performed at [facility], [address] [place], [place] [address]  CBC with Differential     Status: Abnormal   Collection Time: 12/15/21 12:29 PM  Result Value Ref Range   WBC 9.0 4.0 - 10.5 K/uL   RBC 3.81 (L) 3.87 - 5.11 MIL/uL   Hemoglobin 10.6 (L) 12.0 - 15.0 g/dL   HCT 14.733.6 (L) 82.936.0 - 56.246.0 %   MCV  88.2 80.0 - 100.0 fL   MCH 27.8 26.0 - 34.0 pg   MCHC 31.5 30.0 - 36.0 g/dL   RDW 13.012.9 86.511.5 - 78.415.5 %   Platelets 347 150 - 400 K/uL   nRBC 0.0 0.0 - 0.2 %   Neutrophils Relative % 69 %   Neutro Abs 6.2 1.7 - 7.7 K/uL   Lymphocytes Relative 24 %   Lymphs Abs 2.1 0.7 - 4.0 K/uL   Monocytes Relative 5 %   Monocytes Absolute 0.4 0.1 - 1.0 K/uL   Eosinophils Relative 2 %   Eosinophils Absolute 0.2 0.0 - 0.5 K/uL   Basophils Relative 0 %   Basophils Absolute 0.0 0.0 - 0.1 K/uL   Immature Granulocytes 0 %   Abs Immature Granulocytes 0.02 0.00 - 0.07 K/uL    Comment: Performed at [facility], [address] [place], [place] [address]  Resp Panel by RT-PCR (Flu A&B, Covid) Nasopharyngeal Swab     Status: None   Collection Time: 12/15/21  1:52 PM   Specimen: Nasopharyngeal Swab; Nasopharyngeal(NP) swabs in vial transport medium  Result Value Ref Range   SARS Coronavirus 2 by RT PCR NEGATIVE NEGATIVE    Comment: (NOTE) SARS-CoV-2 target nucleic acids are NOT DETECTED.  The SARS-CoV-2 RNA is generally detectable in upper respiratory specimens during the acute phase of infection. The lowest concentration of SARS-CoV-2 viral copies this assay can detect is 138 copies/mL. A negative result does not preclude SARS-Cov-2 infection and should not be used as the sole basis for treatment or other patient management decisions. A negative result may occur with  improper specimen collection/handling, submission of specimen other than nasopharyngeal swab, presence of viral mutation(s) within the areas targeted by this assay, and inadequate number of viral copies(<138 copies/mL). A negative result must be combined with clinical observations, patient history, and epidemiological information. The expected result is Negative.  Fact Sheet for Patients:  BloggerCourse.comhttps://www.fda.gov/media/152166/download  Fact Sheet for Healthcare Providers:  SeriousBroker.ithttps://www.fda.gov/media/152162/download  This  test is no t yet approved or cleared by the [place] FDA and  has been authorized for detection and/or diagnosis of SARS-CoV-2 by FDA under an Emergency Use Authorization (EUA). This EUA will remain  in effect (meaning this test can be used) for the duration of the COVID-19 declaration under Section 564(b)(1) of the Act, 21 U.S.C.section 360bbb-3(b)(1), unless the authorization is terminated  or revoked sooner.       Influenza A by PCR NEGATIVE NEGATIVE   Influenza B by PCR NEGATIVE NEGATIVE    Comment: (NOTE) The Xpert Xpress SARS-CoV-2/FLU/RSV plus assay is intended as an aid in the diagnosis of influenza from Nasopharyngeal swab specimens and should not  be used as a sole basis for treatment. Nasal washings and aspirates are unacceptable for Xpert Xpress SARS-CoV-2/FLU/RSV testing.  Fact Sheet for Patients: BloggerCourse.com  Fact Sheet for Healthcare Providers: SeriousBroker.it  This test is not yet approved or cleared by the [place] FDA and has been authorized for detection and/or diagnosis of SARS-CoV-2 by FDA under an Emergency Use Authorization (EUA). This EUA will remain in effect (meaning this test can be used) for the duration of the COVID-19 declaration under Section 564(b)(1) of the Act, 21 U.S.C. section 360bbb-3(b)(1), unless the authorization is terminated or revoked.  Performed at [facility], [address] [place], [place] [address]     DG Toe Great Right  Result Date: 12/15/2021 CLINICAL DATA:  66 year old female with concern for osteomyelitis. EXAM: RIGHT GREAT TOE COMPARISON:  None. FINDINGS: There is no evidence of fracture or dislocation. There is no evidence of arthropathy or other focal bone abnormality. Diffuse osteopenia. Soft tissues are unremarkable. IMPRESSION: No definite radiographic evidence of osteomyelitis. Recommend MRI if clinical suspicion for osteomyelitis  persists. Electronically Signed   By: Marliss Coots M.D.   On: 12/15/2021 13:26    Review of Systems Blood pressure 130/61, pulse 69, temperature 97.7 F (36.5 C), temperature source Oral, resp. rate 16, height 5\' 4"  (1.626 m), weight 77 kg, SpO2 100 %. Physical Exam General: AAO x3, NAD  Dermatological: The right hallux toenail is hypertrophic, dystrophic with incurvation present on the both the medial and lateral nail borders.  The nail is loose from the nailbed.  The nail is quite tender to touch.  There is edema and erythema on the nail.  There is also incurvation present on the medial aspect of the second digit toenail on the right side with localized edema and erythema.         Vascular: Dorsalis Pedis artery and Posterior Tibial artery pedal pulses are palpable bilateral with immedate capillary fill time. There is no pain with calf compression, swelling, warmth, erythema.   Neruologic: Grossly intact via light touch bilateral.   Musculoskeletal: Quite a bit of tenderness to the right hallux toenail.  Muscular strength 5/5 in all groups tested bilateral.   Assessment/Plan: Ingrown toenail, cellulitis  -Xray reviewed. No signs of osteomyelitis.  Continue IV antibiotics.  The patient continued to have the nail removed as well as at least the medial border of the second digit toenail on the right side.  I discussed trying to sit bedside with a digital block and nail removal.  She seemed quite scared of this today.  We discussed with her tomorrow if not able to visit bedside we will plan on removal in the OR, wound debridement on Friday.  Podiatry will continue to follow.  Sunday 12/15/2021, 2:53 PM

## 2021-12-15 NOTE — ED Notes (Signed)
Attempted blood draw for 2nd set of blood cultures in R wrist. Pt did not tolerate well. Pt began emotionally upset and insisted on no further venipuncture. Unable to access.

## 2021-12-16 ENCOUNTER — Encounter (HOSPITAL_COMMUNITY)
Admission: EM | Disposition: A | Discharge status: Skilled Nursing Facility | Payer: Self-pay | Source: Skilled Nursing Facility | Attending: Internal Medicine

## 2021-12-16 ENCOUNTER — Observation Stay (HOSPITAL_COMMUNITY): Payer: Medicare Other | Admitting: Certified Registered Nurse Anesthetist

## 2021-12-16 DIAGNOSIS — Z20822 Contact with and (suspected) exposure to covid-19: Secondary | ICD-10-CM | POA: Diagnosis present

## 2021-12-16 DIAGNOSIS — Z794 Long term (current) use of insulin: Secondary | ICD-10-CM | POA: Diagnosis not present

## 2021-12-16 DIAGNOSIS — Z961 Presence of intraocular lens: Secondary | ICD-10-CM | POA: Diagnosis present

## 2021-12-16 DIAGNOSIS — E785 Hyperlipidemia, unspecified: Secondary | ICD-10-CM | POA: Diagnosis present

## 2021-12-16 DIAGNOSIS — Z7982 Long term (current) use of aspirin: Secondary | ICD-10-CM | POA: Diagnosis not present

## 2021-12-16 DIAGNOSIS — Z90710 Acquired absence of both cervix and uterus: Secondary | ICD-10-CM | POA: Diagnosis not present

## 2021-12-16 DIAGNOSIS — Z9841 Cataract extraction status, right eye: Secondary | ICD-10-CM | POA: Diagnosis not present

## 2021-12-16 DIAGNOSIS — E119 Type 2 diabetes mellitus without complications: Secondary | ICD-10-CM | POA: Diagnosis not present

## 2021-12-16 DIAGNOSIS — Z9842 Cataract extraction status, left eye: Secondary | ICD-10-CM | POA: Diagnosis not present

## 2021-12-16 DIAGNOSIS — F32A Depression, unspecified: Secondary | ICD-10-CM | POA: Diagnosis present

## 2021-12-16 DIAGNOSIS — Z79899 Other long term (current) drug therapy: Secondary | ICD-10-CM | POA: Diagnosis not present

## 2021-12-16 DIAGNOSIS — M545 Low back pain, unspecified: Secondary | ICD-10-CM | POA: Diagnosis present

## 2021-12-16 DIAGNOSIS — E876 Hypokalemia: Secondary | ICD-10-CM | POA: Diagnosis present

## 2021-12-16 DIAGNOSIS — L02611 Cutaneous abscess of right foot: Secondary | ICD-10-CM

## 2021-12-16 DIAGNOSIS — R404 Transient alteration of awareness: Secondary | ICD-10-CM | POA: Diagnosis not present

## 2021-12-16 DIAGNOSIS — Z800 Family history of malignant neoplasm of digestive organs: Secondary | ICD-10-CM | POA: Diagnosis not present

## 2021-12-16 DIAGNOSIS — I10 Essential (primary) hypertension: Secondary | ICD-10-CM | POA: Diagnosis not present

## 2021-12-16 DIAGNOSIS — I6930 Unspecified sequelae of cerebral infarction: Secondary | ICD-10-CM | POA: Diagnosis not present

## 2021-12-16 DIAGNOSIS — Z888 Allergy status to other drugs, medicaments and biological substances status: Secondary | ICD-10-CM | POA: Diagnosis not present

## 2021-12-16 DIAGNOSIS — L600 Ingrowing nail: Secondary | ICD-10-CM | POA: Diagnosis not present

## 2021-12-16 DIAGNOSIS — L0390 Cellulitis, unspecified: Secondary | ICD-10-CM | POA: Diagnosis not present

## 2021-12-16 DIAGNOSIS — E1165 Type 2 diabetes mellitus with hyperglycemia: Secondary | ICD-10-CM | POA: Diagnosis present

## 2021-12-16 DIAGNOSIS — G459 Transient cerebral ischemic attack, unspecified: Secondary | ICD-10-CM | POA: Diagnosis not present

## 2021-12-16 DIAGNOSIS — Z7902 Long term (current) use of antithrombotics/antiplatelets: Secondary | ICD-10-CM | POA: Diagnosis not present

## 2021-12-16 DIAGNOSIS — D649 Anemia, unspecified: Secondary | ICD-10-CM | POA: Diagnosis present

## 2021-12-16 DIAGNOSIS — Z743 Need for continuous supervision: Secondary | ICD-10-CM | POA: Diagnosis not present

## 2021-12-16 DIAGNOSIS — E11628 Type 2 diabetes mellitus with other skin complications: Secondary | ICD-10-CM | POA: Diagnosis present

## 2021-12-16 DIAGNOSIS — L03031 Cellulitis of right toe: Secondary | ICD-10-CM | POA: Diagnosis not present

## 2021-12-16 DIAGNOSIS — Z8249 Family history of ischemic heart disease and other diseases of the circulatory system: Secondary | ICD-10-CM | POA: Diagnosis not present

## 2021-12-16 HISTORY — PX: IRRIGATION AND DEBRIDEMENT FOOT: SHX6602

## 2021-12-16 LAB — GLUCOSE, CAPILLARY
Glucose-Capillary: 119 mg/dL — ABNORMAL HIGH (ref 70–99)
Glucose-Capillary: 118 mg/dL — ABNORMAL HIGH (ref 70–99)
Glucose-Capillary: 98 mg/dL (ref 70–99)
Glucose-Capillary: 105 mg/dL — ABNORMAL HIGH (ref 70–99)
Glucose-Capillary: 101 mg/dL — ABNORMAL HIGH (ref 70–99)
Glucose-Capillary: 201 mg/dL — ABNORMAL HIGH (ref 70–99)

## 2021-12-16 LAB — HIV ANTIBODY (ROUTINE TESTING W REFLEX): HIV Screen 4th Generation wRfx: NONREACTIVE

## 2021-12-16 LAB — BASIC METABOLIC PANEL
Anion gap: 7 (ref 5–15)
BUN: 13 mg/dL (ref 8–23)
CO2: 28 mmol/L (ref 22–32)
Calcium: 9.5 mg/dL (ref 8.9–10.3)
Chloride: 106 mmol/L (ref 98–111)
Creatinine, Ser: 0.71 mg/dL (ref 0.44–1.00)
GFR, Estimated: >60 mL/min (ref >60)
Glucose, Bld: 120 mg/dL — ABNORMAL HIGH (ref 70–99)
Potassium: 3.7 mmol/L (ref 3.5–5.1)
Sodium: 141 mmol/L (ref 135–145)

## 2021-12-16 SURGERY — IRRIGATION AND DEBRIDEMENT FOOT
Anesthesia: General | Laterality: Right

## 2021-12-16 MED ORDER — LACTATED RINGERS IV SOLN: INTRAVENOUS | Status: DC | PRN

## 2021-12-16 MED ORDER — ONDANSETRON HCL 4 MG/2ML IJ SOLN
INTRAMUSCULAR | Status: DC | PRN
  Administered 2021-12-16: 4 mg via INTRAVENOUS

## 2021-12-16 MED ORDER — BUPIVACAINE HCL (PF) 0.5 % IJ SOLN
INTRAMUSCULAR | Status: AC
  Filled 2021-12-16: qty 30

## 2021-12-16 MED ORDER — ACETAMINOPHEN 10 MG/ML IV SOLN: 1000.0000 mg | Freq: Once | INTRAVENOUS | Status: DC | PRN

## 2021-12-16 MED ORDER — MEPERIDINE HCL 50 MG/ML IJ SOLN: 6.2500 mg | INTRAMUSCULAR | Status: DC | PRN

## 2021-12-16 MED ORDER — BUPIVACAINE HCL (PF) 0.5 % IJ SOLN
INTRAMUSCULAR | Status: DC | PRN
  Administered 2021-12-16: 10 mL

## 2021-12-16 MED ORDER — PROPOFOL 10 MG/ML IV BOLUS
INTRAVENOUS | Status: DC | PRN
  Administered 2021-12-16: 10 mg via INTRAVENOUS
  Administered 2021-12-16: 20 mg via INTRAVENOUS

## 2021-12-16 MED ORDER — MIDAZOLAM HCL 2 MG/2ML IJ SOLN
INTRAMUSCULAR | Status: AC
  Filled 2021-12-16: qty 2

## 2021-12-16 MED ORDER — ONDANSETRON HCL 4 MG/2ML IJ SOLN: 4.0000 mg | Freq: Once | INTRAMUSCULAR | Status: DC | PRN

## 2021-12-16 MED ORDER — LACTATED RINGERS IV SOLN: INTRAVENOUS | Status: DC

## 2021-12-16 MED ORDER — CHLORHEXIDINE GLUCONATE 0.12 % MT SOLN
15.0000 mL | Freq: Once | OROMUCOSAL | Status: AC
  Administered 2021-12-16: 15 mL via OROMUCOSAL

## 2021-12-16 MED ORDER — VANCOMYCIN HCL 750 MG/150ML IV SOLN
750.0000 mg | Freq: Two times a day (BID) | INTRAVENOUS | Status: DC
  Administered 2021-12-16 – 2021-12-17 (×3): 750 mg via INTRAVENOUS
  Filled 2021-12-16 (×4): qty 150

## 2021-12-16 MED ORDER — FENTANYL CITRATE (PF) 100 MCG/2ML IJ SOLN
INTRAMUSCULAR | Status: AC
  Filled 2021-12-16: qty 2

## 2021-12-16 MED ORDER — FENTANYL CITRATE (PF) 100 MCG/2ML IJ SOLN
INTRAMUSCULAR | Status: DC | PRN
  Administered 2021-12-16: 50 ug via INTRAVENOUS

## 2021-12-16 MED ORDER — MIDAZOLAM HCL 2 MG/2ML IJ SOLN
INTRAMUSCULAR | Status: DC | PRN
  Administered 2021-12-16: 1 mg via INTRAVENOUS

## 2021-12-16 MED ORDER — LIDOCAINE 2% (20 MG/ML) 5 ML SYRINGE
INTRAMUSCULAR | Status: DC | PRN
  Administered 2021-12-16: 40 mg via INTRAVENOUS

## 2021-12-16 MED ORDER — VANCOMYCIN HCL 1000 MG IV SOLR
INTRAVENOUS | Status: AC
  Filled 2021-12-16: qty 20

## 2021-12-16 MED ORDER — HYDRALAZINE HCL 20 MG/ML IJ SOLN
INTRAMUSCULAR | Status: AC
  Filled 2021-12-16: qty 1

## 2021-12-16 MED ORDER — 0.9 % SODIUM CHLORIDE (POUR BTL) OPTIME
TOPICAL | Status: DC | PRN
  Administered 2021-12-16: 1000 mL

## 2021-12-16 MED ORDER — HYDRALAZINE HCL 20 MG/ML IJ SOLN
5.0000 mg | Freq: Once | INTRAMUSCULAR | Status: AC
  Administered 2021-12-16: 5 mg via INTRAVENOUS

## 2021-12-16 MED ORDER — HYDROMORPHONE HCL 1 MG/ML IJ SOLN: 0.2500 mg | INTRAMUSCULAR | Status: DC | PRN

## 2021-12-16 MED ORDER — CHLORHEXIDINE GLUCONATE CLOTH 2 % EX PADS: 6.0000 | MEDICATED_PAD | Freq: Once | CUTANEOUS | Status: DC

## 2021-12-16 SURGICAL SUPPLY — 54 items
BLADE SURG 15 STRL LF DISP TIS (BLADE) ×1 IMPLANT
BLADE SURG 15 STRL SS (BLADE) ×1
BNDG COHESIVE 1X5 TAN STRL LF (GAUZE/BANDAGES/DRESSINGS) ×1 IMPLANT
BNDG ELASTIC 3X5.8 VLCR STR LF (GAUZE/BANDAGES/DRESSINGS) ×2 IMPLANT
BNDG ELASTIC 4X5.8 VLCR STR LF (GAUZE/BANDAGES/DRESSINGS) ×2 IMPLANT
BNDG ESMARK 4X9 LF (GAUZE/BANDAGES/DRESSINGS) ×2 IMPLANT
BNDG GAUZE ELAST 4 BULKY (GAUZE/BANDAGES/DRESSINGS) ×2 IMPLANT
CHLORAPREP W/TINT 26 (MISCELLANEOUS) ×2 IMPLANT
CNTNR URN SCR LID CUP LEK RST (MISCELLANEOUS) ×1 IMPLANT
CONT SPEC 4OZ STRL OR WHT (MISCELLANEOUS) ×1
COVER BACK TABLE 60X90IN (DRAPES) ×2 IMPLANT
CUFF TOURN SGL QUICK 18X4 (TOURNIQUET CUFF) ×2 IMPLANT
CUFF TOURN SGL QUICK 24 (TOURNIQUET CUFF) ×1
CUFF TRNQT CYL 24X4X16.5-23 (TOURNIQUET CUFF) ×1 IMPLANT
DRAPE 3/4 80X56 (DRAPES) ×2 IMPLANT
DRAPE EXTREMITY T 121X128X90 (DISPOSABLE) ×2 IMPLANT
DRAPE SHEET LG 3/4 BI-LAMINATE (DRAPES) ×2 IMPLANT
DRAPE U-SHAPE 47X51 STRL (DRAPES) ×2 IMPLANT
ELECT REM PT RETURN 15FT ADLT (MISCELLANEOUS) ×2 IMPLANT
GAUZE SPONGE 4X4 12PLY STRL (GAUZE/BANDAGES/DRESSINGS) ×2 IMPLANT
GAUZE XEROFORM 1X8 LF (GAUZE/BANDAGES/DRESSINGS) ×2 IMPLANT
GLOVE SRG 8 PF TXTR STRL LF DI (GLOVE) ×1 IMPLANT
GLOVE SURG ENC MOIS LTX SZ7.5 (GLOVE) ×2 IMPLANT
GLOVE SURG NEOPR MICRO LF SZ8 (GLOVE) ×2 IMPLANT
GLOVE SURG UNDER POLY LF SZ8 (GLOVE) ×1
GOWN STRL REUS W/ TWL XL LVL3 (GOWN DISPOSABLE) ×1 IMPLANT
GOWN STRL REUS W/TWL XL LVL3 (GOWN DISPOSABLE) ×1
KIT BASIN OR (CUSTOM PROCEDURE TRAY) ×2 IMPLANT
KIT TURNOVER KIT A (KITS) IMPLANT
MANIFOLD NEPTUNE II (INSTRUMENTS) ×2 IMPLANT
NDL HYPO 25X1 1.5 SAFETY (NEEDLE) ×1 IMPLANT
NEEDLE HYPO 25X1 1.5 SAFETY (NEEDLE) ×2 IMPLANT
NS IRRIG 1000ML POUR BTL (IV SOLUTION) ×2 IMPLANT
PADDING CAST ABS 4INX4YD NS (CAST SUPPLIES) ×1
PADDING CAST ABS COTTON 4X4 ST (CAST SUPPLIES) ×1 IMPLANT
PENCIL SMOKE EVACUATOR (MISCELLANEOUS) IMPLANT
SET IRRIG Y TYPE TUR BLADDER L (SET/KITS/TRAYS/PACK) ×2 IMPLANT
SPONGE T-LAP 4X18 ~~LOC~~+RFID (SPONGE) ×2 IMPLANT
STAPLER VISISTAT 35W (STAPLE) ×2 IMPLANT
STOCKINETTE 6  STRL (DRAPES) ×1
STOCKINETTE 6 STRL (DRAPES) ×1 IMPLANT
SUCTION FRAZIER HANDLE 10FR (MISCELLANEOUS) ×1
SUCTION TUBE FRAZIER 10FR DISP (MISCELLANEOUS) ×1 IMPLANT
SUT ETHILON 3 0 PS 1 (SUTURE) ×2 IMPLANT
SUT ETHILON 4 0 PS 2 18 (SUTURE) ×2 IMPLANT
SUT MNCRL AB 3-0 PS2 18 (SUTURE) ×2 IMPLANT
SUT MNCRL AB 4-0 PS2 18 (SUTURE) ×2 IMPLANT
SUT VIC AB 2-0 SH 27 (SUTURE) ×1
SUT VIC AB 2-0 SH 27XBRD (SUTURE) ×1 IMPLANT
SYR BULB EAR ULCER 3OZ GRN STR (SYRINGE) ×2 IMPLANT
SYR CONTROL 10ML LL (SYRINGE) ×2 IMPLANT
TUBE IRRIGATION SET MISONIX (TUBING) ×2 IMPLANT
UNDERPAD 30X36 HEAVY ABSORB (UNDERPADS AND DIAPERS) ×2 IMPLANT
YANKAUER SUCT BULB TIP NO VENT (SUCTIONS) ×2 IMPLANT

## 2021-12-16 NOTE — Anesthesia Procedure Notes (Signed)
Date/Time: 12/16/2021 5:20 PM Performed by: Minerva Ends, CRNA Pre-anesthesia Checklist: Patient identified, Emergency Drugs available, Suction available, Timeout performed and Patient being monitored Oxygen Delivery Method: Simple face mask Placement Confirmation: positive ETCO2 and breath sounds checked- equal and bilateral Dental Injury: Teeth and Oropharynx as per pre-operative assessment

## 2021-12-16 NOTE — Anesthesia Preprocedure Evaluation (Addendum)
Anesthesia Evaluation  Patient identified by MRN, date of birth, ID band Patient awake    Reviewed: Allergy & Precautions, NPO status , Patient's Chart, lab work & pertinent test results  Airway Mallampati: II  TM Distance: >3 FB     Dental   Pulmonary    breath sounds clear to auscultation       Cardiovascular hypertension, Pt. on medications and Pt. on home beta blockers  Rhythm:Regular Rate:Normal     Neuro/Psych PSYCHIATRIC DISORDERS CVA    GI/Hepatic   Endo/Other  diabetes, Type 2, Insulin Dependent  Renal/GU      Musculoskeletal   Abdominal   Peds  Hematology  (+) Blood dyscrasia, anemia ,   Anesthesia Other Findings   Reproductive/Obstetrics                           Anesthesia Physical Anesthesia Plan  ASA: 3  Anesthesia Plan: MAC   Post-op Pain Management:    Induction: Intravenous  PONV Risk Score and Plan: Ondansetron and Treatment may vary due to age or medical condition  Airway Management Planned: Simple Face Mask and Nasal Cannula  Additional Equipment: None  Intra-op Plan:   Post-operative Plan:   Informed Consent: I have reviewed the patients History and Physical, chart, labs and discussed the procedure including the risks, benefits and alternatives for the proposed anesthesia with the patient or authorized representative who has indicated his/her understanding and acceptance.     Dental advisory given  Plan Discussed with: CRNA and Anesthesiologist  Anesthesia Plan Comments:       Anesthesia Quick Evaluation

## 2021-12-16 NOTE — Progress Notes (Signed)
Updated patients niece, Ms. Harris. Went over the procedure and the post-op course. She has no further questions.

## 2021-12-16 NOTE — TOC Initial Note (Signed)
Transition of Care [facility]) - Initial/Assessment Note    Patient Details  Name: Lauren Waller MRN: [id number] Date of Birth: 30-Mar-1956  Transition of Care [facility]) CM/SW Contact:    Lennart Pall, LCSW Phone Number: 12/16/2021, 12:45 PM  Clinical Narrative:                 Met with pt and spoke with niece via phone today to introduce self/ TOC role with dc planning needs.  Pt very gracious but admits feeling nervous about upcoming procedure on toe.  They confirm that pt is a LTC resident at [facility] - now known as [facility] - and they plan for her to return when medically cleared.  Deny any concerns/ questions at this time.  Will follow for dc needs.  Expected Discharge Plan: [place] Barriers to Discharge: Continued Medical Work up   Patient Goals and CMS Choice Patient states their goals for this hospitalization and ongoing recovery are:: return to SNF      Expected Discharge Plan and Services Expected Discharge Plan: [place] In-house Referral: Clinical Social Work     Living arrangements for the past 2 months: [place] ([place] (now [facility]))                 DME Arranged: N/A DME Agency: NA                  Prior Living Arrangements/Services Living arrangements for the past 2 months: [place] ([place] (now [facility])) Lives with:: Facility Resident Patient language and need for interpreter reviewed:: Yes Do you feel safe going back to the place where you live?: Yes      Need for Family Participation in Patient Care: No (Comment) Care giver support system in place?: Yes (comment)   Criminal Activity/Legal Involvement Pertinent to Current Situation/Hospitalization: No - Comment as needed  Activities of Daily Living Home Assistive Devices/Equipment: Wheelchair ADL Screening (condition at time of admission) Patient's cognitive ability adequate  to safely complete daily activities?: Yes Is the patient deaf or have difficulty hearing?: No Does the patient have difficulty seeing, even when wearing glasses/contacts?: No Does the patient have difficulty concentrating, remembering, or making decisions?: No Patient able to express need for assistance with ADLs?: Yes Does the patient have difficulty dressing or bathing?: Yes Independently performs ADLs?: No Communication: Independent Dressing (OT): Needs assistance Is this a change from baseline?: Pre-admission baseline Grooming: Needs assistance Is this a change from baseline?: Pre-admission baseline Feeding: Independent Bathing: Needs assistance Is this a change from baseline?: Pre-admission baseline Toileting: Needs assistance Is this a change from baseline?: Pre-admission baseline In/Out Bed: Needs assistance Is this a change from baseline?: Pre-admission baseline Walks in Home: Dependent Is this a change from baseline?: Pre-admission baseline Does the patient have difficulty walking or climbing stairs?: Yes Weakness of Legs: Both Weakness of Arms/Hands: Left  Permission Sought/Granted Permission sought to share information with : Family Supports, Chartered certified accountant granted to share information with : Yes, Verbal Permission Granted  Share Information with NAME: Elliot Dally     Permission granted to share info w Relationship: niece  Permission granted to share info w Contact Information: [phone]  Emotional Assessment Appearance:: Appears stated age Attitude/Demeanor/Rapport: Gracious Affect (typically observed): Pleasant, Quiet Orientation: : Oriented to Self, Oriented to Place, Oriented to  Time, Oriented to Situation Alcohol / Substance Use: Not Applicable Psych Involvement: No (comment)  Admission diagnosis:  Cellulitis of right toe [L03.031] Patient Active Problem List   Diagnosis Date Noted   Cellulitis of right toe 12/15/2021    Ingrown nail of great toe of right foot 12/15/2021   Hyperglycemia due to type 2 diabetes mellitus ([place]) 12/15/2021   Pure hypercholesterolemia 12/15/2021   Anemia 12/15/2021   Hypokalemia 12/15/2021   Intertrochanteric fracture of right femur ([place]) 10/17/2019   History of CVA with residual deficit 10/17/2019   Hypoalbuminemia 10/17/2019   Normocytic anemia 10/17/2019   Fall 10/17/2019   Cerebellar cerebrovascular accident (CVA) without late effect 08/02/2019   Cerebrovascular accident ([place]) 08/02/2019   HLD (hyperlipidemia) 04/17/2018   Hypertensive urgency 04/17/2018   Facial cellulitis 04/17/2018   Sepsis ([place]) 04/17/2018   Diabetes mellitus without complication ([place])    Hypertension    Varicose veins of bilateral lower extremities with other complications 41/74/0814   PCP:  Benito Mccreedy, MD Pharmacy:  No Pharmacies Listed    Social Determinants of Health (SDOH) Interventions    Readmission Risk Interventions No flowsheet data found.

## 2021-12-16 NOTE — Progress Notes (Signed)
PROGRESS NOTE Lauren Waller  HYW:[id number] DOB: 06-03-1956 DOA: 12/15/2021 PCP: Jackie Plum, MD   Brief Narrative/Hospital Course: 66 year old female  with multiple comorbidities with history of CVA October/2020 following which she has been in nursing home, type 2 diabetes mellitus on long-term insulin, hypertension, hyperlipidemia, depression, low back pain, right femur fracture in December/2020 chronic anemia sent from nursing home to the ED for evaluation of worsening infection of the right great toe. Patient is nonambulatory at baseline, was recently being treated with oral Keflex at the facility for ingrown toenail infection but without improvement so sent to the ED for evaluation. Patient complains of pain and tenderness on the right great toe with erythema, refusing to be touched, alert awake oriented but appears poor historian. In the ED patient afebrile routine labs showed blood sugar 217 potassium 3.4 normal lactic acid normal CBC. X-ray of the right great toe showed no evidence of osteomyelitis. Patient was given bolus vancomycin Zosyn and admission was requested for further management.    Subjective:  Seen examined this morning. Alert awake oriented no new complaints Overnight afebrile Tmax 99, blood pressure stable BMP stable with improved hypokalemia  Assessment and Plan: * Ingrown nail of great toe of right foot- (present on admission) Right great toe cellulitis: X-ray no signs of osteomyelitis, did not improve with oral Keflex at nursing home.  Podiatry consulted appreciate input-planning for toenail removal and debridement in OR, likely today weaning.  Keep NPO.  Discussed with pharmacy assisting vancomycin for appropriate antibiotic coverage follow-up or culture sample . Cont pain control.  Distal pulses are intact  Cellulitis of right toe- (present on admission) See above  Hypokalemia Resolved  Cerebrovascular accident [facility])- (present on  admission) History of CVA in 2020, cont her aspirin and Plavix and statin  Hyperglycemia due to type 2 diabetes mellitus (HCC)- (present on admission) Type 2 diabetes with hyperglycemia on long-term insulin: Blood sugar is stable continue home long-acting insulin, sliding scale monitor A1c. Recent Labs  Lab 12/15/21 1636 12/15/21 2101 12/16/21 0802  GLUCAP 156* 128* 119*    Normocytic anemia- (present on admission) Hemoglobin is stable.  Monitor  HLD (hyperlipidemia)- (present on admission) Continue her statin  Hypertension- (present on admission) Blood pressure stable continue home clonidine amlodipine and atenolol   DVT prophylaxis: enoxaparin (LOVENOX) injection 40 mg Start: 12/15/21 1800 Code Status:   Code Status: Full Code Family Communication: plan of care discussed with patient/at bedside.  Disposition: Currently  NOT medically stable for discharge. Status is: admitted as Observation The patient will require care spanning > 2 midnights and should be moved to inpatient because: Due to ongoing IV antibiotic needs and need for operative intervention possibly Friday Objective: Vitals last 24 hrs: Vitals:   12/15/21 1526 12/15/21 2059 12/16/21 0113 12/16/21 0506  BP: 136/72 (!) 151/73 (!) 157/69 (!) 146/72  Pulse: 66 62 73 71  Resp: 16 18 17 18   Temp: 98.2 F (36.8 C) 98.1 F (36.7 C) 98.8 F (37.1 C) 99 F (37.2 C)  TempSrc: Oral     SpO2: 99% 99% 98% 100%  Weight:      Height:       Weight change:   Physical Examination: General exam: AAOX3,older than stated age, weak appearing. HEENT:Oral mucosa moist, Ear/Nose WNL grossly, dentition normal. Respiratory system: bilaterally diminished BS, no use of accessory muscle Cardiovascular system: S1 & S2 +, No JVD,. Gastrointestinal system: Abdomen soft,NT,ND, BS+ Nervous System:Alert, awake, oriented x3  Extremities: LE edema NONE, right great toe edematous  reduced swollen (not allowing to touch due to extreme  pain),distal peripheral pulses palpable.  Skin: No rashes,no icterus. MSK: Normal muscle bulk,tone, power  Medications reviewed:  Scheduled Meds:  aspirin EC  81 mg Oral Daily   atenolol  100 mg Oral Daily   atorvastatin  40 mg Oral Daily   clopidogrel  75 mg Oral Daily   enoxaparin (LOVENOX) injection  40 mg Subcutaneous Q24H   ferrous sulfate  325 mg Oral BID WC   hydrALAZINE  100 mg Oral Q8H   insulin aspart  0-9 Units Subcutaneous TID WC   insulin glargine-yfgn  13 Units Subcutaneous Daily   mirtazapine  7.5 mg Oral QHS   multivitamin with minerals  1 tablet Oral Daily   pantoprazole  20 mg Oral Daily   polyethylene glycol  17 g Oral Daily   senna-docusate  2 tablet Oral BID   Continuous Infusions:  vancomycin        Diet Order             Diet NPO time specified  Diet effective now                    Intake/Output Summary (Last 24 hours) at 12/16/2021 1039 Last data filed at 12/16/2021 0506 Gross per 24 hour  Intake 390 ml  Output 400 ml  Net -10 ml   Net IO Since Admission: -10 mL [12/16/21 1039]  Wt Readings from Last 3 Encounters:  12/15/21 77 kg  08/08/19 77 kg  04/17/18 77.1 kg     Unresulted Labs (From admission, onward)     Start     Ordered   12/22/21 0500  Creatinine, serum  (enoxaparin (LOVENOX)    CrCl >/= 30 ml/min)  Weekly,   R     Comments: while on enoxaparin therapy    12/15/21 1432   12/16/21 0500  Hemoglobin A1c  Tomorrow morning,   R       Comments: To assess prior glycemic control    12/15/21 1432           Data Reviewed: I have personally reviewed following labs and imaging studies CBC: Recent Labs  Lab 12/15/21 1229  WBC 9.0  NEUTROABS 6.2  HGB 10.6*  HCT 33.6*  MCV 88.2  PLT 347   Basic Metabolic Panel: Recent Labs  Lab 12/15/21 1229 12/16/21 0325  NA 138 141  K 3.4* 3.7  CL 104 106  CO2 28 28  GLUCOSE 217* 120*  BUN 14 13  CREATININE 0.68 0.71  CALCIUM 9.3 9.5   GFR: Estimated Creatinine  Clearance: 71.3 mL/min (by C-G formula based on SCr of 0.71 mg/dL). Liver Function Tests: Recent Labs  Lab 12/15/21 1229  AST 25  ALT 27  ALKPHOS 100  BILITOT 0.2*  PROT 7.7  ALBUMIN 3.7   No results for input(s): LIPASE, AMYLASE in the last 168 hours. No results for input(s): AMMONIA in the last 168 hours. Coagulation Profile: No results for input(s): INR, PROTIME in the last 168 hours. Cardiac Enzymes: No results for input(s): CKTOTAL, CKMB, CKMBINDEX, TROPONINI in the last 168 hours. BNP (last 3 results) No results for input(s): PROBNP in the last 8760 hours. HbA1C: No results for input(s): HGBA1C in the last 72 hours. CBG: Recent Labs  Lab 12/15/21 1636 12/15/21 2101 12/16/21 0802  GLUCAP 156* 128* 119*   Lipid Profile: No results for input(s): CHOL, HDL, LDLCALC, TRIG, CHOLHDL, LDLDIRECT in the last 72 hours. Thyroid Function Tests:  No results for input(s): TSH, T4TOTAL, FREET4, T3FREE, THYROIDAB in the last 72 hours. Anemia Panel: No results for input(s): VITAMINB12, FOLATE, FERRITIN, TIBC, IRON, RETICCTPCT in the last 72 hours. Sepsis Labs: Recent Labs  Lab 12/15/21 1229 12/15/21 1552  LATICACIDVEN 1.6 0.8    Recent Results (from the past 240 hour(s))  Blood culture (routine x 2)     Status: None (Preliminary result)   Collection Time: 12/15/21 12:30 PM   Specimen: BLOOD  Result Value Ref Range Status   Specimen Description   Final    BLOOD BOTTLES DRAWN AEROBIC AND ANAEROBIC Performed at [facility], [address] [place], [place] [address]    Special Requests   Final    BLOOD LEFT FOREARM Blood Culture adequate volume Performed at [facility], [address] [place], [place] [address]    Culture   Final    NO GROWTH < 24 HOURS Performed at [facility], [address] [place], [place] [address]    Report Status PENDING  Incomplete  Resp Panel by RT-PCR (Flu A&B, Covid) Nasopharyngeal Swab      Status: None   Collection Time: 12/15/21  1:52 PM   Specimen: Nasopharyngeal Swab; Nasopharyngeal(NP) swabs in vial transport medium  Result Value Ref Range Status   SARS Coronavirus 2 by RT PCR NEGATIVE NEGATIVE Final    Comment: (NOTE) SARS-CoV-2 target nucleic acids are NOT DETECTED.  The SARS-CoV-2 RNA is generally detectable in upper respiratory specimens during the acute phase of infection. The lowest concentration of SARS-CoV-2 viral copies this assay can detect is 138 copies/mL. A negative result does not preclude SARS-Cov-2 infection and should not be used as the sole basis for treatment or other patient management decisions. A negative result may occur with  improper specimen collection/handling, submission of specimen other than nasopharyngeal swab, presence of viral mutation(s) within the areas targeted by this assay, and inadequate number of viral copies(<138 copies/mL). A negative result must be combined with clinical observations, patient history, and epidemiological information. The expected result is Negative.  Fact Sheet for Patients:  BloggerCourse.comhttps://www.fda.gov/media/152166/download  Fact Sheet for Healthcare Providers:  SeriousBroker.ithttps://www.fda.gov/media/152162/download  This test is no t yet approved or cleared by the [place] FDA and  has been authorized for detection and/or diagnosis of SARS-CoV-2 by FDA under an Emergency Use Authorization (EUA). This EUA will remain  in effect (meaning this test can be used) for the duration of the COVID-19 declaration under Section 564(b)(1) of the Act, 21 U.S.C.section 360bbb-3(b)(1), unless the authorization is terminated  or revoked sooner.       Influenza A by PCR NEGATIVE NEGATIVE Final   Influenza B by PCR NEGATIVE NEGATIVE Final    Comment: (NOTE) The Xpert Xpress SARS-CoV-2/FLU/RSV plus assay is intended as an aid in the diagnosis of influenza from Nasopharyngeal swab specimens and should not be used as a sole basis  for treatment. Nasal washings and aspirates are unacceptable for Xpert Xpress SARS-CoV-2/FLU/RSV testing.  Fact Sheet for Patients: BloggerCourse.comhttps://www.fda.gov/media/152166/download  Fact Sheet for Healthcare Providers: SeriousBroker.ithttps://www.fda.gov/media/152162/download  This test is not yet approved or cleared by the [place] FDA and has been authorized for detection and/or diagnosis of SARS-CoV-2 by FDA under an Emergency Use Authorization (EUA). This EUA will remain in effect (meaning this test can be used) for the duration of the COVID-19 declaration under Section 564(b)(1) of the Act, 21 U.S.C. section 360bbb-3(b)(1), unless the authorization is terminated or revoked.  Performed at [facility], [address], [place], [place] [address]   Blood culture (routine x 2)     Status: None (Preliminary result)   Collection Time: 12/15/21  3:52 PM   Specimen: BLOOD  Result Value Ref Range Status   Specimen Description   Final    BLOOD RIGHT ANTECUBITAL Performed at [facility], [address] [place], [place] [address]    Special Requests   Final    BOTTLES DRAWN AEROBIC ONLY Blood Culture results may not be optimal due to an inadequate volume of blood received in culture bottles Performed at [facility], [address] [place], [place] [address]    Culture   Final    NO GROWTH < 24 HOURS Performed at [facility], [address], [place], [place] [address]    Report Status PENDING  Incomplete    Antimicrobials: Anti-infectives (From admission, onward)    Start     Dose/Rate Route Frequency Ordered Stop   12/16/21 1115  vancomycin (VANCOREADY) IVPB 750 mg/150 mL        750 mg 150 mL/hr over 60 Minutes Intravenous Every 12 hours 12/16/21 1015     12/15/21 1800  clindamycin (CLEOCIN) IVPB 600 mg  Status:  Discontinued        600 mg 100 mL/hr over 30 Minutes Intravenous Every 8 hours 12/15/21 1432 12/16/21 1012    12/15/21 1315  vancomycin (VANCOREADY) IVPB 1500 mg/300 mL        1,500 mg 150 mL/hr over 120 Minutes Intravenous  Once 12/15/21 1251 12/15/21 1540   12/15/21 1245  piperacillin-tazobactam (ZOSYN) IVPB 3.375 g        3.375 g 100 mL/hr over 30 Minutes Intravenous  Once 12/15/21 1236 12/15/21 1440      Culture/Microbiology    Component Value Date/Time   SDES  12/15/2021 1552    BLOOD RIGHT ANTECUBITAL Performed at [facility], [address], [place], [place] [address]    SPECREQUEST  12/15/2021 1552    BOTTLES DRAWN AEROBIC ONLY Blood Culture results may not be optimal due to an inadequate volume of blood received in culture bottles Performed at [facility], [address], [place], [place] [address]    CULT  12/15/2021 1552    NO GROWTH < 24 HOURS Performed at [facility], [address] [place], [place] [address]    REPTSTATUS PENDING 12/15/2021 1552      Radiology Studies: DG Toe Great Right  Result Date: 12/15/2021 CLINICAL DATA:  66 year old female with concern for osteomyelitis. EXAM: RIGHT GREAT TOE COMPARISON:  None. FINDINGS: There is no evidence of fracture or dislocation. There is no evidence of arthropathy or other focal bone abnormality. Diffuse osteopenia. Soft tissues are unremarkable. IMPRESSION: No definite radiographic evidence of osteomyelitis. Recommend MRI if clinical suspicion for osteomyelitis persists. Electronically Signed   By: Marliss Coots M.D.   On: 12/15/2021 13:26     LOS: 0 days   Lanae Boast, MD [facility]  12/16/2021, 10:39 AM

## 2021-12-16 NOTE — Progress Notes (Signed)
Orthopedic Tech Progress Note Patient Details:  Lauren Waller 1956/06/20 [id number]  Patient ID: Lauren Waller, female   DOB: [birth year], 66 y.o.   MRN: [id number]  [name] 12/16/2021, 6:13 PM Right post op shoe applied in pacu

## 2021-12-16 NOTE — Progress Notes (Addendum)
Pharmacy Antibiotic Note  Lauren Waller is a 66 y.o. female admitted on 12/15/2021 with cellulitis secondary to an ingrown toenail.  Plans for patient to go to OR with podiatry later today. Pt current Scr (0.71) with a CrCl (71.31ml/min). Pharmacy has been consulted for vancomycin dosing.  Plan: Pt received Vancomycin loading dose of 1500mg  on 2/22. Will start vancomycin 750mg  Q12H maintenance dose. 2/23 Calculated AUC of 489.1 with Scr (0.8) used. Monitor patients renal function and levels as appropriate.  Height: 5\' 4"  (162.6 cm) Weight: 77 kg (169 lb 12.1 oz) IBW/kg (Calculated) : 54.7  Temp (24hrs), Avg:98.4 F (36.9 C), Min:97.7 F (36.5 C), Max:99 F (37.2 C)  Recent Labs  Lab 12/15/21 1229 12/15/21 1552 12/16/21 0325  WBC 9.0  --   --   CREATININE 0.68  --  0.71  LATICACIDVEN 1.6 0.8  --     Estimated Creatinine Clearance: 71.3 mL/min (by C-G formula based on SCr of 0.71 mg/dL).    Allergies  Allergen Reactions   Januvia [Sitagliptin] Swelling   Metformin And Related Nausea And Vomiting    Thank you for allowing pharmacy to be a part of this patients care.  12/17/21, PharmD Candidate [phone] 12/16/2021 11:03 AM  Adah Salvage, PharmD, BCPS, BCIDP Infectious Diseases Clinical Pharmacist Phone: [phone] 12/16/2021 11:13 AM

## 2021-12-16 NOTE — Assessment & Plan Note (Signed)
See above

## 2021-12-16 NOTE — Interval H&P Note (Signed)
History and Physical Interval Note:  12/16/2021 5:14 PM  Lauren Waller  has presented today for surgery, with the diagnosis of abcess right foot.  The various methods of treatment have been discussed with the patient and family. After consideration of risks, benefits and other options for treatment, the patient has consented to  Procedure(s): IRRIGATION AND DEBRIDEMENT FOOT (Right) with toenail removal of the 1st and 2nd toenail as a surgical intervention.  The patient's history has been reviewed, patient examined, no change in status, stable for surgery.  I have reviewed the patient's chart and labs.  Questions were answered to the patient's satisfaction.    I again discussed the surgery with the patient. The 2nd toenail has pus coming from an ingrown nail as well so therefore will remove both the 1st and 2nd toenails and debride the wounds.   I attempted to call the patient's niece but no answer.    Vivi Barrack

## 2021-12-16 NOTE — Transfer of Care (Signed)
Immediate Anesthesia Transfer of Care Note  Patient: Lauren Waller  Procedure(s) Performed: IRRIGATION AND DEBRIDEMENT FOOT (Right)  Patient Location: PACU  Anesthesia Type:MAC  Level of Consciousness: awake  Airway & Oxygen Therapy: Patient Spontanous Breathing and Patient connected to face mask oxygen  Post-op Assessment: Report given to RN and Post -op Vital signs reviewed and stable  Post vital signs: Reviewed and stable  Last Vitals:  Vitals Value Taken Time  BP    Temp    Pulse 62 12/16/21 1803  Resp 11 12/16/21 1803  SpO2 100 % 12/16/21 1803  Vitals shown include unvalidated device data.  Last Pain:  Vitals:   12/16/21 1312  TempSrc: Oral  PainSc:          Complications: No notable events documented.

## 2021-12-16 NOTE — Anesthesia Postprocedure Evaluation (Signed)
Anesthesia Post Note  Patient: Lauren Waller  Procedure(s) Performed: IRRIGATION AND DEBRIDEMENT FOOT (Right)     Anesthesia Type: General Anesthetic complications: no   No notable events documented.  Last Vitals:  Vitals:   12/16/21 1835 12/16/21 1900  BP: (!) 153/71 (!) 146/78  Pulse: 61 64  Resp: 14 14  Temp:  37 C  SpO2: 98% 97%    Last Pain:  Vitals:   12/16/21 1940  TempSrc:   PainSc: 0-No pain                 Favian Kittleson

## 2021-12-16 NOTE — Brief Op Note (Signed)
12/16/2021  5:59 PM  PATIENT:  Lauren Waller  66 y.o. female  PRE-OPERATIVE DIAGNOSIS:  abcess right foot  POST-OPERATIVE DIAGNOSIS:  abcess right foot  PROCEDURE:  Procedure(s): IRRIGATION AND DEBRIDEMENT FOOT (Right), Removal of the first and second toenails  SURGEON:  Surgeon(s) and Role:    * Trula Slade, DPM - Primary  PHYSICIAN ASSISTANT:   ASSISTANTS: none   ANESTHESIA:   MAC  EBL:  minimal   BLOOD ADMINISTERED:none  DRAINS: none   LOCAL MEDICATIONS USED:  OTHER 10 cc marcaine plain   SPECIMEN:  Source of Specimen:  wound culture  DISPOSITION OF SPECIMEN:  PATHOLOGY  COUNTS:  YES  TOURNIQUET:  * Missing tourniquet times found for documented tourniquets in log: [id number] *  DICTATION: .Dragon Dictation  PLAN OF CARE: Admit to inpatient   PATIENT DISPOSITION:  PACU - hemodynamically stable.   Delay start of Pharmacological VTE agent (>24hrs) due to surgical blood loss or risk of bleeding: no  Intraoperative findings: Removed the 1st and 2nd toenails. Purulence noted, wound culture obtained. Debrided the wounds. Penrose drains used for tourniquets. At the conclusion of the procedure immediate CRT noted. Due to open wounds I am going to order ABI while she is here. Likely ok for discharge Friday afternoon/Saturday morning. Podiatry will see her tomorrow prior to discharge.

## 2021-12-17 ENCOUNTER — Encounter (HOSPITAL_COMMUNITY): Payer: Self-pay | Admitting: Podiatry

## 2021-12-17 ENCOUNTER — Inpatient Hospital Stay (HOSPITAL_COMMUNITY): Payer: Medicare Other

## 2021-12-17 DIAGNOSIS — L0390 Cellulitis, unspecified: Secondary | ICD-10-CM

## 2021-12-17 DIAGNOSIS — L600 Ingrowing nail: Secondary | ICD-10-CM | POA: Diagnosis not present

## 2021-12-17 LAB — BASIC METABOLIC PANEL
Anion gap: 7 (ref 5–15)
BUN: 21 mg/dL (ref 8–23)
CO2: 26 mmol/L (ref 22–32)
Calcium: 9 mg/dL (ref 8.9–10.3)
Chloride: 106 mmol/L (ref 98–111)
Creatinine, Ser: 0.85 mg/dL (ref 0.44–1.00)
GFR, Estimated: >60 mL/min (ref >60)
Glucose, Bld: 135 mg/dL — ABNORMAL HIGH (ref 70–99)
Potassium: 3.4 mmol/L — ABNORMAL LOW (ref 3.5–5.1)
Sodium: 139 mmol/L (ref 135–145)

## 2021-12-17 LAB — GLUCOSE, CAPILLARY
Glucose-Capillary: 112 mg/dL — ABNORMAL HIGH (ref 70–99)
Glucose-Capillary: 169 mg/dL — ABNORMAL HIGH (ref 70–99)
Glucose-Capillary: 130 mg/dL — ABNORMAL HIGH (ref 70–99)

## 2021-12-17 LAB — HEMOGLOBIN A1C
Hgb A1c MFr Bld: 7.8 % — ABNORMAL HIGH (ref 4.8–5.6)
Mean Plasma Glucose: 177 mg/dL

## 2021-12-17 MED ORDER — CEPHALEXIN 500 MG PO CAPS: 500.0000 mg | ORAL_CAPSULE | Freq: Three times a day (TID) | ORAL | 0 refills | Status: AC

## 2021-12-17 MED ORDER — HYDROCODONE-ACETAMINOPHEN 5-325 MG PO TABS: 1.0000 | ORAL_TABLET | Freq: Four times a day (QID) | ORAL | 0 refills | Status: AC | PRN

## 2021-12-17 MED ORDER — POTASSIUM CHLORIDE CRYS ER 20 MEQ PO TBCR
20.0000 meq | EXTENDED_RELEASE_TABLET | Freq: Once | ORAL | Status: AC
  Administered 2021-12-17: 20 meq via ORAL
  Filled 2021-12-17: qty 1

## 2021-12-17 NOTE — Plan of Care (Signed)
Report called to Angelique Holm at [employer] [facility]. Patient discharging this evening via PTAR to SNF where she resided prior to hospitalization. Haydee Salter, RN 12/17/21 4:55 PM

## 2021-12-17 NOTE — Plan of Care (Signed)
°  Problem: Clinical Measurements: Goal: Ability to avoid or minimize complications of infection will improve Outcome: Progressing   Problem: Education: Goal: Knowledge of General Education information will improve Description: Including pain rating scale, medication(s)/side effects and non-pharmacologic comfort measures Outcome: Progressing   Problem: Clinical Measurements: Goal: Ability to maintain clinical measurements within normal limits will improve Outcome: Progressing   Problem: Pain Managment: Goal: General experience of comfort will improve Outcome: Progressing   Problem: Safety: Goal: Ability to remain free from injury will improve Outcome: Progressing

## 2021-12-17 NOTE — TOC Transition Note (Signed)
Transition of Care [facility]) - CM/SW Discharge Note   Patient Details  Name: Lauren Waller MRN: [id number] Date of Birth: May 11, 1956  Transition of Care [facility]) CM/SW Contact:  Lennart Pall, LCSW Phone Number: 12/17/2021, 2:39 PM   Clinical Narrative:    Pt medically cleared for return to LTC bed today at [facility] Facility ready for return.  Notified niece of pt's d/c.  PTAR called at 2:35pm.  RN to call report to [phone]. No further TOC needs.   Final next level of care: Long Term Nursing Home Barriers to Discharge: Barriers Resolved   Patient Goals and CMS Choice Patient states their goals for this hospitalization and ongoing recovery are:: return to SNF      Discharge Placement              Patient [facility] bed at: [facility] Patient to be transferred to facility by: [place] Name of family member notified: niece Patient and family notified of of transfer: 12/17/21  Discharge Plan and Services In-house Referral: Clinical Social Work              DME Arranged: N/A DME Agency: NA                  Social Determinants of Health ([place]) Interventions     Readmission Risk Interventions No flowsheet data found.

## 2021-12-17 NOTE — Discharge Summary (Signed)
Physician Discharge Summary   Patient: Lauren Waller MRN: [id number] DOB: 01/25/1956  Admit date:     12/15/2021  Discharge date: 12/17/21  Discharge Physician: Lanae Boast   PCP: Jackie Plum, MD   Recommendations at discharge:   Dr Rowan Blase in 1 wk pcp in 1 wk  Cbc bmp in 1 wk    Hospital Course: 66 year old female  with multiple comorbidities with history of CVA October/2020 following which she has been in nursing home, type 2 diabetes mellitus on long-term insulin, hypertension, hyperlipidemia, depression, low back pain, right femur fracture in December/2020 chronic anemia sent from nursing home to the ED for evaluation of worsening infection of the right great toe. Patient is nonambulatory at baseline, was recently being treated with oral Keflex at the facility for ingrown toenail infection but without improvement so sent to the ED for evaluation. Patient complains of pain and tenderness on the right great toe with erythema, refusing to be touched, alert awake oriented but appears poor historian. In the ED patient afebrile routine labs showed blood sugar 217 potassium 3.4 normal lactic acid normal CBC. X-ray of the right great toe showed no evidence of osteomyelitis.patient was started on antibiotics and admitted for further management.  Seen by podiatry-underwent irrigation and debridement removal of the first and second toenails 2/23, wound cultured.  Patient feels significantly improved after the surgery.  Requesting walker/cane for NH Seen by podiatry in postop follow-up, okay for discharge to nursing home on oral antibiotics x10 days and follow-up outpatient  Discharge Diagnoses: Principal Problem:   Ingrown nail of great toe of right foot Active Problems:   Cellulitis and abscess of toe of right foot   Hypertension   HLD (hyperlipidemia)   Normocytic anemia   Hyperglycemia due to type 2 diabetes mellitus (HCC)   Cerebrovascular accident (HCC)   Hypokalemia *  Ingrown nail of great toe of right foot- (present on admission) Right great toe cellulitis and abscess: X-ray no signs of osteomyelitis, did not improve with oral Keflex at nursing home. Seen by podiatry-underwent irrigation and debridement removal of the first and second toenails 2/23, wound cultured-treated with  Vancomycin.seen by podiatry.  Advised to discharge on oral antibiotics x10 days, continue wound care.  Wound culture from the OR are pending.   Cellulitis and abscess of toe of right foot- (present on admission) See above  Hypokalemia Replete.  Cerebrovascular accident [facility])- (present on admission) History of CVA in 2020, cont her aspirin and Plavix and statin  Hyperglycemia due to type 2 diabetes mellitus (HCC)- (present on admission) Type 2 diabetes with hyperglycemia on long-term insulin: Blood sugar is controlled continue home long-acting insulin sliding scale insulin, A1c 7.8.  Borderline Recent Labs  Lab 12/16/21 0325 12/16/21 0802 12/16/21 1647 12/16/21 1806 12/16/21 1850 12/16/21 2135 12/17/21 0718  GLUCAP  --    < > 98 105* 101* 201* 112*  HGBA1C 7.8*  --   --   --   --   --   --    < > = values in this interval not displayed.    Normocytic anemia- (present on admission) Hemoglobin is stable.  Monitor  HLD (hyperlipidemia)- (present on admission) Continue her statin  Hypertension- (present on admission) Stable, cont clonidine amlodipine and atenolol         Consultants: Podiatry Procedures performed: I&D and toe nail removal of right 2 TOES Disposition: Nursing home Diet recommendation:  Diet Orders (From admission, onward)     Start     Ordered  12/16/21 1849  Diet Carb Modified Fluid consistency: Thin; Room service appropriate? Yes  Diet effective now       Question Answer Comment  Diet-HS Snack? Nothing   Calorie Level Medium 1600-2000   Fluid consistency: Thin   Room service appropriate? Yes      12/16/21 1848             DISCHARGE MEDICATION: Allergies as of 12/17/2021       Reactions   Januvia [sitagliptin] Swelling   Metformin And Related Nausea And Vomiting        Medication List     TAKE these medications    acetaminophen 325 MG tablet Commonly known as: TYLENOL Take 650 mg by mouth every 6 (six) hours as needed for mild pain or fever.   amLODipine 10 MG tablet Commonly known as: NORVASC Take 10 mg by mouth daily.   aspirin 81 MG EC tablet Take 1 tablet (81 mg total) by mouth daily.   atenolol 100 MG tablet Commonly known as: TENORMIN Take 1 tablet by mouth daily   atorvastatin 40 MG tablet Commonly known as: LIPITOR Take 1 tablet (40 mg total) by mouth daily.   B-complex with vitamin C tablet Take 1 tablet by mouth daily.   cephALEXin 500 MG capsule Commonly known as: KEFLEX Take 1 capsule (500 mg total) by mouth 3 (three) times daily for 10 days.   cloNIDine 0.2 mg/24hr patch Commonly known as: CATAPRES - Dosed in mg/24 hr Place 0.2 mg onto the skin once a week. Saturday   clopidogrel 75 MG tablet Commonly known as: PLAVIX Take 1 tablet (75 mg total) by mouth daily.   diclofenac Sodium 1 % Gel Commonly known as: VOLTAREN Apply 2 g topically 2 (two) times daily.   EMOLLIA-LOTION EX Apply 1 application topically 2 (two) times daily. To hands   Ensure Take 237 mLs by mouth daily.   ferrous sulfate 325 (65 FE) MG tablet Take 1 tablet (325 mg total) by mouth 2 (two) times daily with a meal.   HumaLOG KwikPen 100 UNIT/ML KwikPen Generic drug: insulin lispro Inject 0-5 Units into the skin 3 (three) times daily with meals. CBG = 0-100 = 0 units CBG 101-400 = 5 unts   hydrALAZINE 100 MG tablet Commonly known as: APRESOLINE Take 1 tablet (100 mg total) by mouth every 8 (eight) hours.   HYDROcodone-acetaminophen 5-325 MG tablet Commonly known as: NORCO/VICODIN Take 1 tablet by mouth every 6 (six) hours as needed for moderate pain.   Lantus SoloStar 100 UNIT/ML  Solostar Pen Generic drug: insulin glargine Inject 13 Units into the skin daily.   mirtazapine 7.5 MG tablet Commonly known as: REMERON Take 7.5 mg by mouth at bedtime.   multivitamin with minerals Tabs tablet Take 1 tablet by mouth daily.   OVER THE COUNTER MEDICATION Take 237 mLs by mouth 2 (two) times daily. Health Shake for Nutritional supplement   pantoprazole 20 MG tablet Commonly known as: PROTONIX Take 20 mg by mouth daily.   polyethylene glycol 17 g packet Commonly known as: MIRALAX / GLYCOLAX Take 17 g by mouth daily.   senna-docusate 8.6-50 MG tablet Commonly known as: Senokot-S Take 2 tablets by mouth 2 (two) times daily.               Durable Medical Equipment  (From admission, onward)           Start     Ordered   12/17/21 1130  For home use only  DME Cane  Once       Comments: Right toe wound   12/17/21 1129              Discharge Care Instructions  (From admission, onward)           Start     Ordered   12/17/21 0000  Discharge wound care:       Comments: soak in epsom salts/warm water for 20 minutes daily. cover with antibiotic ointment, nonstick dressing and cover with  a bandage  Continued dressing instruction as per podiatry, follow-up with podiatry office   12/17/21 1316            Follow-up Information     Osei-Bonsu, Greggory StallionGeorge, MD Follow up in 1 week(s).   Specialty: Internal Medicine Contact information: [address] [place] [address] [phone]         Vivi BarrackWagoner, Matthew R, DPM Follow up in 1 week(s).   Specialty: Podiatry Contact information: [address] [place] [address] [phone]                 Discharge Exam: Ceasar MonsFiled Weights   12/15/21 1302  Weight: 77 kg   Condition at discharge: fair  The results of significant diagnostics from this hospitalization (including imaging, microbiology, ancillary and laboratory) are listed below for reference.    Imaging Studies: DG Toe Great Right  Result Date: 12/15/2021 CLINICAL DATA:  66 year old female with concern for osteomyelitis. EXAM: RIGHT GREAT TOE COMPARISON:  None. FINDINGS: There is no evidence of fracture or dislocation. There is no evidence of arthropathy or other focal bone abnormality. Diffuse osteopenia. Soft tissues are unremarkable. IMPRESSION: No definite radiographic evidence of osteomyelitis. Recommend MRI if clinical suspicion for osteomyelitis persists. Electronically Signed   By: Marliss Cootsylan  Suttle M.D.   On: 12/15/2021 13:26    Microbiology: Results for orders placed or performed during the hospital encounter of 12/15/21  Blood culture (routine x 2)     Status: None (Preliminary result)   Collection Time: 12/15/21 12:30 PM   Specimen: BLOOD  Result Value Ref Range Status   Specimen Description   Final    BLOOD BOTTLES DRAWN AEROBIC AND ANAEROBIC Performed at [facility], [address] [place], [place] [address]    Special Requests   Final    BLOOD LEFT FOREARM Blood Culture adequate volume Performed at [facility], [address] [place], [place] [address]    Culture   Final    NO GROWTH 2 DAYS Performed at [facility], [address] [place], [place] [address]    Report Status PENDING  Incomplete  Resp Panel by RT-PCR (Flu A&B, Covid) Nasopharyngeal Swab     Status: None   Collection Time: 12/15/21  1:52 PM   Specimen: Nasopharyngeal Swab; Nasopharyngeal(NP) swabs in vial transport medium  Result Value Ref Range Status   SARS Coronavirus 2 by RT PCR NEGATIVE NEGATIVE Final    Comment: (NOTE) SARS-CoV-2 target nucleic acids are NOT DETECTED.  The SARS-CoV-2 RNA is generally detectable in upper respiratory specimens during the acute phase of infection. The lowest concentration of SARS-CoV-2 viral copies this assay can detect is 138 copies/mL. A negative result does not preclude SARS-Cov-2 infection and should not  be used as the sole basis for treatment or other patient management decisions. A negative result may occur with  improper specimen collection/handling, submission of specimen other than nasopharyngeal swab, presence of viral  mutation(s) within the areas targeted by this assay, and inadequate number of viral copies(<138 copies/mL). A negative result must be combined with clinical observations, patient history, and epidemiological information. The expected result is Negative.  Fact Sheet for Patients:  BloggerCourse.comhttps://www.fda.gov/media/152166/download  Fact Sheet for Healthcare Providers:  SeriousBroker.ithttps://www.fda.gov/media/152162/download  This test is no t yet approved or cleared by the [place] FDA and  has been authorized for detection and/or diagnosis of SARS-CoV-2 by FDA under an Emergency Use Authorization (EUA). This EUA will remain  in effect (meaning this test can be used) for the duration of the COVID-19 declaration under Section 564(b)(1) of the Act, 21 U.S.C.section 360bbb-3(b)(1), unless the authorization is terminated  or revoked sooner.       Influenza A by PCR NEGATIVE NEGATIVE Final   Influenza B by PCR NEGATIVE NEGATIVE Final    Comment: (NOTE) The Xpert Xpress SARS-CoV-2/FLU/RSV plus assay is intended as an aid in the diagnosis of influenza from Nasopharyngeal swab specimens and should not be used as a sole basis for treatment. Nasal washings and aspirates are unacceptable for Xpert Xpress SARS-CoV-2/FLU/RSV testing.  Fact Sheet for Patients: BloggerCourse.comhttps://www.fda.gov/media/152166/download  Fact Sheet for Healthcare Providers: SeriousBroker.ithttps://www.fda.gov/media/152162/download  This test is not yet approved or cleared by the [place] FDA and has been authorized for detection and/or diagnosis of SARS-CoV-2 by FDA under an Emergency Use Authorization (EUA). This EUA will remain in effect (meaning this test can be used) for the duration of the COVID-19 declaration under Section  564(b)(1) of the Act, 21 U.S.C. section 360bbb-3(b)(1), unless the authorization is terminated or revoked.  Performed at [facility], [address] [place], [place] [address]   Blood culture (routine x 2)     Status: None (Preliminary result)   Collection Time: 12/15/21  3:52 PM   Specimen: BLOOD  Result Value Ref Range Status   Specimen Description   Final    BLOOD RIGHT ANTECUBITAL Performed at [facility], [address] [place], [place] [address]    Special Requests   Final    BOTTLES DRAWN AEROBIC ONLY Blood Culture results may not be optimal due to an inadequate volume of blood received in culture bottles Performed at [facility], [address] [place], [place] [address]    Culture   Final    NO GROWTH 2 DAYS Performed at [facility], [address] [place], [place] [address]    Report Status PENDING  Incomplete  Aerobic/Anaerobic Culture w Gram Stain (surgical/deep wound)     Status: None (Preliminary result)   Collection Time: 12/16/21  5:48 PM   Specimen: PATH Other; Tissue  Result Value Ref Range Status   Specimen Description   Final    ABSCESS Performed at [facility], [address] [place], [place] [address]    Special Requests   Final    RIGHT BIG TOE Performed at [facility], [address] [place], [place] [address]    Gram Stain   Final    NO SQUAMOUS EPITHELIAL CELLS SEEN RARE WBC SEEN NO ORGANISMS SEEN Performed at [facility], [address] [place], [place] [address]    Culture PENDING  Incomplete   Report Status PENDING  Incomplete    Labs: CBC: Recent Labs  Lab 12/15/21 1229  WBC 9.0  NEUTROABS 6.2  HGB 10.6*  HCT 33.6*  MCV 88.2  PLT 347   Basic Metabolic Panel: Recent Labs  Lab  12/15/21 1229 12/16/21 0325 12/17/21 0316  NA 138 141 139  K 3.4* 3.7 3.4*  CL 104 106 106  CO2 28 28 26   GLUCOSE 217* 120* 135*   BUN 14 13 21   CREATININE 0.68 0.71 0.85  CALCIUM 9.3 9.5 9.0   Liver Function Tests: Recent Labs  Lab 12/15/21 1229  AST 25  ALT 27  ALKPHOS 100  BILITOT 0.2*  PROT 7.7  ALBUMIN 3.7   CBG: Recent Labs  Lab 12/16/21 1806 12/16/21 1850 12/16/21 2135 12/17/21 0718 12/17/21 1149  GLUCAP 105* 101* 201* 112* 169*    Discharge time spent: 35 minutes.  Signed: 12/19/21, MD [facility] 12/17/2021

## 2021-12-17 NOTE — Discharge Instructions (Signed)
Soak Instructions    THE DAY AFTER THE PROCEDURE- PLEASE HAVE SOMEONE CHECK THE TEMPERATURE OF THE WATER. AFTER SOAKING MAKE SURE THE FEET AND IN BETWEEN THE TOES ARE DRY.   Place 1/4 cup of epsom salts in a quart of warm tap water.  Submerge your foot or feet with outer bandage intact for the initial soak; this will allow the bandage to become moist and wet for easy lift off.  Once you remove your bandage, continue to soak in the solution for 20 minutes.  This soak should be done twice a day.  Next, remove your foot or feet from solution, blot dry the affected area and cover.  You may use a band aid large enough to cover the area or use gauze and tape.  Apply other medications to the area as directed by the doctor such as polysporin neosporin.  IF YOUR SKIN BECOMES IRRITATED WHILE USING THESE INSTRUCTIONS, IT IS OKAY TO SWITCH TO  WHITE VINEGAR AND WATER. Or you may use antibacterial soap and water to keep the toe clean  Monitor for any signs/symptoms of infection. Call the office immediately if any occur or go directly to the emergency room. Call with any questions/concerns.

## 2021-12-17 NOTE — Progress Notes (Signed)
ABI exam has been completed.  Results can be found under chart review under CV PROC. 12/17/2021 4:03 PM Jakarie Pember RVT, RDMS

## 2021-12-19 ENCOUNTER — Other Ambulatory Visit: Payer: Self-pay | Admitting: Podiatry

## 2021-12-19 DIAGNOSIS — I739 Peripheral vascular disease, unspecified: Secondary | ICD-10-CM

## 2021-12-20 DIAGNOSIS — L03031 Cellulitis of right toe: Secondary | ICD-10-CM | POA: Diagnosis not present

## 2021-12-20 DIAGNOSIS — L600 Ingrowing nail: Secondary | ICD-10-CM | POA: Diagnosis not present

## 2021-12-20 DIAGNOSIS — E119 Type 2 diabetes mellitus without complications: Secondary | ICD-10-CM | POA: Diagnosis not present

## 2021-12-20 DIAGNOSIS — I10 Essential (primary) hypertension: Secondary | ICD-10-CM | POA: Diagnosis not present

## 2021-12-20 LAB — CULTURE, BLOOD (ROUTINE X 2)
Culture: NO GROWTH
Culture: NO GROWTH
Special Requests: ADEQUATE

## 2021-12-20 NOTE — Progress Notes (Signed)
°  Subjective:  Patient ID: Len Kluver, female    DOB: [birth year],  MRN: [id number]   Doing well not having much pain dressing stayed on overnight  Negative for chest pain and shortness of breath Fever: no Constitutional signs: no Objective:   Vitals:   12/17/21 0948 12/17/21 1342  BP: (!) 147/67 (!) 143/67  Pulse: 69 73  Resp: 18 16  Temp: 99.3 F (37.4 C) 98.7 F (37.1 C)  SpO2: 96% 98%   General AA&O x3. Normal mood and affect.  Vascular Dorsalis pedis and posterior tibial pulses 2/4 bilat. Brisk capillary refill to all digits. Pedal hair present.  Neurologic Epicritic sensation grossly intact.  Dermatologic Dressing clean dry and intact  Orthopedic: MMT 5/5 in dorsiflexion, plantarflexion, inversion, and eversion. Normal joint ROM without pain or crepitus.    Assessment & Plan:  Patient was evaluated and treated and all questions answered.  POD 1 status post removal of nail plate -Doing well okay to discharge. -Soaking twice daily.  Instructions put in AVS for discharge   Edwin Cap, DPM  Accessible via secure chat for questions or concerns.

## 2021-12-21 DIAGNOSIS — L03031 Cellulitis of right toe: Secondary | ICD-10-CM | POA: Diagnosis not present

## 2021-12-21 DIAGNOSIS — E11628 Type 2 diabetes mellitus with other skin complications: Secondary | ICD-10-CM | POA: Diagnosis not present

## 2021-12-21 DIAGNOSIS — M6281 Muscle weakness (generalized): Secondary | ICD-10-CM | POA: Diagnosis not present

## 2021-12-22 DIAGNOSIS — B3731 Acute candidiasis of vulva and vagina: Secondary | ICD-10-CM | POA: Diagnosis not present

## 2021-12-22 DIAGNOSIS — Z5181 Encounter for therapeutic drug level monitoring: Secondary | ICD-10-CM | POA: Diagnosis not present

## 2021-12-22 DIAGNOSIS — E0811 Diabetes mellitus due to underlying condition with ketoacidosis with coma: Secondary | ICD-10-CM | POA: Diagnosis not present

## 2021-12-22 DIAGNOSIS — L03031 Cellulitis of right toe: Secondary | ICD-10-CM | POA: Diagnosis not present

## 2021-12-22 DIAGNOSIS — I10 Essential (primary) hypertension: Secondary | ICD-10-CM | POA: Diagnosis not present

## 2021-12-22 LAB — AEROBIC/ANAEROBIC CULTURE W GRAM STAIN (SURGICAL/DEEP WOUND): Gram Stain: NONE SEEN

## 2021-12-23 ENCOUNTER — Ambulatory Visit (INDEPENDENT_AMBULATORY_CARE_PROVIDER_SITE_OTHER): Payer: Medicare Other | Admitting: Podiatry

## 2021-12-23 ENCOUNTER — Other Ambulatory Visit: Payer: Self-pay

## 2021-12-23 DIAGNOSIS — M1990 Unspecified osteoarthritis, unspecified site: Secondary | ICD-10-CM | POA: Diagnosis not present

## 2021-12-23 DIAGNOSIS — D649 Anemia, unspecified: Secondary | ICD-10-CM | POA: Diagnosis not present

## 2021-12-23 DIAGNOSIS — L03031 Cellulitis of right toe: Secondary | ICD-10-CM | POA: Diagnosis not present

## 2021-12-23 DIAGNOSIS — M79674 Pain in right toe(s): Secondary | ICD-10-CM

## 2021-12-23 DIAGNOSIS — L600 Ingrowing nail: Secondary | ICD-10-CM

## 2021-12-23 DIAGNOSIS — K219 Gastro-esophageal reflux disease without esophagitis: Secondary | ICD-10-CM | POA: Diagnosis not present

## 2021-12-23 DIAGNOSIS — I10 Essential (primary) hypertension: Secondary | ICD-10-CM | POA: Diagnosis not present

## 2021-12-23 DIAGNOSIS — E119 Type 2 diabetes mellitus without complications: Secondary | ICD-10-CM | POA: Diagnosis not present

## 2021-12-23 DIAGNOSIS — M79675 Pain in left toe(s): Secondary | ICD-10-CM

## 2021-12-23 NOTE — Op Note (Signed)
PATIENT:  Lauren Waller  66 y.o. female   PRE-OPERATIVE DIAGNOSIS:  abcess right foot   POST-OPERATIVE DIAGNOSIS:  abcess right foot   PROCEDURE:  Procedure(s): IRRIGATION AND DEBRIDEMENT FOOT (Right), Removal of the first and second toenails   SURGEON:  Surgeon(s) and Role:    * Trula Slade, DPM - Primary   PHYSICIAN ASSISTANT:    ASSISTANTS: none    ANESTHESIA:   MAC   EBL:  minimal    BLOOD ADMINISTERED:none   DRAINS: none    LOCAL MEDICATIONS USED:  OTHER 10 cc marcaine plain    SPECIMEN:  Source of Specimen:  wound culture   DISPOSITION OF SPECIMEN:  PATHOLOGY   COUNTS:  YES   TOURNIQUET:  * Missing tourniquet times found for documented tourniquets in log: [id number] *   DICTATION: .Dragon Dictation   PLAN OF CARE: Admit to inpatient    PATIENT DISPOSITION:  PACU - hemodynamically stable.   Delay start of Pharmacological VTE agent (>24hrs) due to surgical blood loss or risk of bleeding: no  Indications for surgery: 66 year old female admitted to the hospital for concerns of infection, cellulitis with ingrown toenail to her right big toe.  Upon clinical exam there is notably purulence coming from the right second toe as well.  Due to this I discussed with her nail removals, I&D and wound debridement.  She was not able to tolerate this at bedside so brought her to the operating for this.  Discussed alternatives risks and complications.  No promises or guarantees given second of the procedure and all questions were answered to the best my ability.  Procedure in detail: The patient was both verbally and visually identified by myself, nursing staff, the anesthesia staff preoperatively.  She was then transferred the operating via stretcher where the surgery took place.  After adequate plane of anesthesia was obtained a mixture of lidocaine, Marcaine plain was infiltrated in a regional block fashion after timeout was performed.  The right lower extremities and  scrubbed, prepped, draped in normal sterile fashion.  I utilized Penrose drains for tourniquets on both the first and second toes.  At this time I used a freer elevator to free of the nail borders on the first and second toenails of the nails were removed in total.  Culture obtained of purulence. There is found to be hypergranulation tissue present underneath the hallux toenail and this was debrided down to healthy tissue.  There is no lacerations of the nailbed and nail beds intact.  No further drainage or purulence noted.  I copiously irrigated the wounds and Xeroform was applied followed by dry sterile dressing.  The tourniquets were released and there was found to be an immediate cap refill time to the digits.  She was awoken anesthesia and found to tolerate the procedure well and complications.  She was transferred to PACU vital signs stable vascular status intact.

## 2021-12-27 NOTE — Progress Notes (Signed)
Subjective: ?66 year old female presents the office today for postop visit status post right first and second digit toenail removals.  She states that she still does get some discomfort and she is asking if the dressing still need to be changed as often.  She denies any fevers or chills.  No other concerns. ? ?Objective: ?AAO x3, NAD ?DP/PT pulses palpable bilaterally, CRT less than 3 seconds ?Status post removal of the right first and second digit toenails.  Appears to be granulating in well and there is no exposed bone or tendon.  There is mild edema.  There is cellulitis that was noted prior has much improved and almost completely resolved.  There is no drainage or pus.  No signs of an abscess.  There is tenderness palpation along the nail beds. ?No pain with calf compression, swelling, warmth, erythema ? ?Assessment: ?66 year old female status post total nail avulsions ? ?Plan: ?-All treatment options discussed with the patient including all alternatives, risks, complications.  ?-Procedure is considered to be healing well.  I cleansed with saline and antibiotic ointment and a bandage applied.  I will continue with daily dressing changes for now.  ?-Given abnormal ABI referral to vascular surgery was placed. ?-Monitor for any clinical signs or symptoms of infection and directed to call the office immediately should any occur or go to the ER. ?-Patient encouraged to call the office with any questions, concerns, change in symptoms.  ? ?Trula Slade DPM ? ?

## 2021-12-28 DIAGNOSIS — L97512 Non-pressure chronic ulcer of other part of right foot with fat layer exposed: Secondary | ICD-10-CM | POA: Diagnosis not present

## 2021-12-28 DIAGNOSIS — L03031 Cellulitis of right toe: Secondary | ICD-10-CM | POA: Diagnosis not present

## 2021-12-28 DIAGNOSIS — L97511 Non-pressure chronic ulcer of other part of right foot limited to breakdown of skin: Secondary | ICD-10-CM | POA: Diagnosis not present

## 2021-12-28 DIAGNOSIS — M6281 Muscle weakness (generalized): Secondary | ICD-10-CM | POA: Diagnosis not present

## 2021-12-28 DIAGNOSIS — E11628 Type 2 diabetes mellitus with other skin complications: Secondary | ICD-10-CM | POA: Diagnosis not present

## 2021-12-28 DIAGNOSIS — S91204D Unspecified open wound of right lesser toe(s) with damage to nail, subsequent encounter: Secondary | ICD-10-CM | POA: Diagnosis not present

## 2021-12-30 ENCOUNTER — Encounter: Payer: Medicare Other | Admitting: Vascular Surgery

## 2022-01-04 ENCOUNTER — Other Ambulatory Visit: Payer: Self-pay

## 2022-01-04 ENCOUNTER — Encounter: Payer: Self-pay | Admitting: Vascular Surgery

## 2022-01-04 ENCOUNTER — Ambulatory Visit (INDEPENDENT_AMBULATORY_CARE_PROVIDER_SITE_OTHER): Payer: Medicare Other | Admitting: Vascular Surgery

## 2022-01-04 DIAGNOSIS — I70221 Atherosclerosis of native arteries of extremities with rest pain, right leg: Secondary | ICD-10-CM | POA: Diagnosis not present

## 2022-01-04 NOTE — Progress Notes (Signed)
? ? ?Patient name: Lauren Waller MRN: [id number] DOB: 1956/06/25 Sex: female ? ?REASON FOR CONSULT: Evaluate PAD ? ?HPI: ?Lauren Waller is a 66 y.o. female, with history of diabetes, hypertension, hyperlipidemia, previous stroke in a nursing facility that presents for evaluation of PAD of her right lower extremity.  Patient was referred by podiatry given she had an abscess in her right foot requiring removal of the first and second toenails on 12/17/2021.  She had noninvasive imaging that showed ABIs of 0.93 on the right monophasic and 0.98 on the left monophasic.  She denies any previous lower extremity interventions in the past. ? ?She is a poor historian.  In discussing with the nurse aide here from her facility she is able to stand and transfer with assistance. ? ?Past Medical History:  ?Diagnosis Date  ? Chronic lower back pain   ? Depression   ? Facial cellulitis 04/17/2018  ? HLD (hyperlipidemia)   ? Hypertension   ? Type II diabetes mellitus (HCC)   ? ? ?Past Surgical History:  ?Procedure Laterality Date  ? BUBBLE STUDY  08/06/2019  ? Procedure: BUBBLE STUDY;  Surgeon: Chrystie NoseHilty, Kenneth C, MD;  [facility];  Service: Cardiovascular;;  ? CATARACT EXTRACTION W/ INTRAOCULAR LENS  IMPLANT, BILATERAL Bilateral   ? EYE SURGERY Bilateral   ? 2016  ? fatty tissue removal from upper back    ? 1998  ? INGUINAL HERNIA REPAIR Left   ? INTRAMEDULLARY (IM) NAIL INTERTROCHANTERIC Right 10/17/2019  ? Procedure: INTRAMEDULLARY (IM) NAIL INTERTROCHANTRIC;  Surgeon: Yolonda Kidaogers, Jason Patrick, MD;  [facility];  Service: Orthopedics;  Laterality: Right;  ? IRRIGATION AND DEBRIDEMENT FOOT Right 12/16/2021  ? Procedure: IRRIGATION AND DEBRIDEMENT FOOT;  Surgeon: Vivi BarrackWagoner, Matthew R, DPM;  Location: [facility] ORS;  Service: Podiatry;  Laterality: Right;  ? MYOMECTOMY    ? ROBOTIC ASSISTED LAPAROSCOPIC VAGINAL HYSTERECTOMY WITH FIBROID REMOVAL    ? 2009  ? TEE WITHOUT CARDIOVERSION N/A 08/06/2019  ? Procedure:  TRANSESOPHAGEAL ECHOCARDIOGRAM (TEE);  Surgeon: Chrystie NoseHilty, Kenneth C, MD;  [facility];  Service: Cardiovascular;  Laterality: N/A;  ? ? ?Family History  ?Problem Relation Age of Onset  ? Hypertension Mother   ? Pancreatic cancer Brother   ? Stroke Neg Hx   ? ? ?SOCIAL HISTORY: ?Social History  ? ?Socioeconomic History  ? Marital status: Divorced  ?  Spouse name: Not on file  ? Number of children: 1  ? Years of education: Not on file  ? Highest education level: Not on file  ?Occupational History  ? Occupation: unemployed  ?Tobacco Use  ? Smoking status: Never  ? Smokeless tobacco: Never  ?Vaping Use  ? Vaping Use: Never used  ?Substance and Sexual Activity  ? Alcohol use: Not Currently  ? Drug use: Never  ? Sexual activity: Not on file  ?Other Topics Concern  ? Not on file  ?Social History Narrative  ? Not on file  ? ?Social Determinants of Health  ? ?Financial Resource Strain: Not on file  ?Food Insecurity: Not on file  ?Transportation Needs: Not on file  ?Physical Activity: Not on file  ?Stress: Not on file  ?Social Connections: Not on file  ?Intimate Partner Violence: Not on file  ? ? ?Allergies  ?Allergen Reactions  ? Januvia [Sitagliptin] Swelling  ? Metformin And Related Nausea And Vomiting  ? ? ?Current Outpatient Medications  ?Medication Sig Dispense Refill  ? acetaminophen (TYLENOL) 325 MG tablet Take 650 mg by mouth every 6 (six) hours  as needed for mild pain or fever.    ? amLODipine (NORVASC) 10 MG tablet Take 10 mg by mouth daily.  3  ? aspirin EC 81 MG EC tablet Take 1 tablet (81 mg total) by mouth daily. 30 tablet 0  ? atenolol (TENORMIN) 100 MG tablet Take 1 tablet by mouth daily (Patient taking differently: Take 100 mg by mouth daily.) 30 tablet 6  ? atorvastatin (LIPITOR) 40 MG tablet Take 1 tablet (40 mg total) by mouth daily. 30 tablet 0  ? B Complex-C (B-COMPLEX WITH VITAMIN C) tablet Take 1 tablet by mouth daily. 30 tablet 0  ? cloNIDine (CATAPRES - DOSED IN MG/24 HR) 0.2 mg/24hr patch  Place 0.2 mg onto the skin once a week. Saturday    ? clopidogrel (PLAVIX) 75 MG tablet Take 1 tablet (75 mg total) by mouth daily. 30 tablet 0  ? diclofenac Sodium (VOLTAREN) 1 % GEL Apply 2 g topically 2 (two) times daily.    ? Emollient (EMOLLIA-LOTION EX) Apply 1 application topically 2 (two) times daily. To hands    ? Ensure (ENSURE) Take 237 mLs by mouth daily.    ? ferrous sulfate 325 (65 FE) MG tablet Take 1 tablet (325 mg total) by mouth 2 (two) times daily with a meal. 60 tablet 0  ? HUMALOG KWIKPEN 100 UNIT/ML KwikPen Inject 0-5 Units into the skin 3 (three) times daily with meals. CBG = 0-100 = 0 units CBG 101-400 = 5 unts    ? hydrALAZINE (APRESOLINE) 100 MG tablet Take 1 tablet (100 mg total) by mouth every 8 (eight) hours. 90 tablet 0  ? HYDROcodone-acetaminophen (NORCO/VICODIN) 5-325 MG tablet Take 1 tablet by mouth every 6 (six) hours as needed for up to 4 doses for moderate pain. 4 tablet 0  ? LANTUS SOLOSTAR 100 UNIT/ML Solostar Pen Inject 13 Units into the skin daily.    ? mirtazapine (REMERON) 7.5 MG tablet Take 7.5 mg by mouth at bedtime.    ? Multiple Vitamin (MULTIVITAMIN WITH MINERALS) TABS tablet Take 1 tablet by mouth daily. 30 tablet 0  ? OVER THE COUNTER MEDICATION Take 237 mLs by mouth 2 (two) times daily. Health Shake for Nutritional supplement    ? pantoprazole (PROTONIX) 20 MG tablet Take 20 mg by mouth daily.    ? polyethylene glycol (MIRALAX / GLYCOLAX) 17 g packet Take 17 g by mouth daily. 14 each 0  ? senna-docusate (SENOKOT-S) 8.6-50 MG tablet Take 2 tablets by mouth 2 (two) times daily. 120 tablet 0  ? ?No current facility-administered medications for this visit.  ? ? ?REVIEW OF SYSTEMS:  ?[X]  denotes positive finding, [ ]  denotes negative finding ?Cardiac  Comments:  ?Chest pain or chest pressure:    ?Shortness of breath upon exertion:    ?Short of breath when lying flat:    ?Irregular heart rhythm:    ?    ?Vascular    ?Pain in calf, thigh, or hip brought on by ambulation:     ?Pain in feet at night that wakes you up from your sleep:     ?Blood clot in your veins:    ?Leg swelling:     ?    ?Pulmonary    ?Oxygen at home:    ?Productive cough:     ?Wheezing:     ?    ?Neurologic    ?Sudden weakness in arms or legs:     ?Sudden numbness in arms or legs:     ?Sudden onset  of difficulty speaking or slurred speech:    ?Temporary loss of vision in one eye:     ?Problems with dizziness:     ?    ?Gastrointestinal    ?Blood in stool:     ?Vomited blood:     ?    ?Genitourinary    ?Burning when urinating:     ?Blood in urine:    ?    ?Psychiatric    ?Major depression:     ?    ?Hematologic    ?Bleeding problems:    ?Problems with blood clotting too easily:    ?    ?Skin    ?Rashes or ulcers:    ?    ?Constitutional    ?Fever or chills:    ? ? ?PHYSICAL EXAM: ?Vitals:  ? 01/04/22 1342  ?BP: (!) 144/84  ?Pulse: 61  ?Resp: 16  ?Temp: (!) 97.5 ?F (36.4 ?C)  ?TempSrc: Temporal  ?SpO2: 95%  ?Weight: 159 lb (72.1 kg)  ?Height: 5\' 4"  (1.626 m)  ? ? ?GENERAL: The patient is a well-nourished female, in no acute distress. The vital signs are documented above. ?CARDIAC: There is a regular rate and rhythm.  ?VASCULAR:  ?Bilateral femoral pulses palpable ?No palpable pedal pulses ?Right first and second toe wounds pictured below ?PULMONARY: No respiratory distress. ?ABDOMEN: Soft and non-tender. ?MUSCULOSKELETAL: There are no major deformities or cyanosis. ?NEUROLOGIC: No focal weakness or paresthesias are detected. ?PSYCHIATRIC: The patient has a normal affect. ? ? ? ? ?DATA:  ? ?ABIs two 12/17/21 are 0.93 on the right monophasic and 0.98 on the left monophasic ? ?Assessment/Plan: ? ?66 year old female with with history of diabetes, hypertension, hyperlipidemia, previous CVA in a nursing facility that presents for evaluation of PAD of her right lower extremity.  Patient was referred by podiatry given she had an abscess in her right foot requiring removal of the first and second toenails on 12/17/2021.  The  patient is a poor historian but according to the nurse aide here the patient is able to stand and transfer with assistance.  As a result, I have recommended an aortogram with lower extremity arteriogram to ensure sh

## 2022-01-05 DIAGNOSIS — E11628 Type 2 diabetes mellitus with other skin complications: Secondary | ICD-10-CM | POA: Diagnosis not present

## 2022-01-05 DIAGNOSIS — S91204D Unspecified open wound of right lesser toe(s) with damage to nail, subsequent encounter: Secondary | ICD-10-CM | POA: Diagnosis not present

## 2022-01-05 DIAGNOSIS — L03031 Cellulitis of right toe: Secondary | ICD-10-CM | POA: Diagnosis not present

## 2022-01-05 DIAGNOSIS — M6281 Muscle weakness (generalized): Secondary | ICD-10-CM | POA: Diagnosis not present

## 2022-01-06 ENCOUNTER — Ambulatory Visit (INDEPENDENT_AMBULATORY_CARE_PROVIDER_SITE_OTHER): Payer: Medicare Other | Admitting: Podiatry

## 2022-01-06 ENCOUNTER — Other Ambulatory Visit: Payer: Self-pay

## 2022-01-06 DIAGNOSIS — L600 Ingrowing nail: Secondary | ICD-10-CM

## 2022-01-06 DIAGNOSIS — I739 Peripheral vascular disease, unspecified: Secondary | ICD-10-CM

## 2022-01-10 DIAGNOSIS — I739 Peripheral vascular disease, unspecified: Secondary | ICD-10-CM | POA: Insufficient documentation

## 2022-01-10 NOTE — Progress Notes (Signed)
Subjective: ?66 year old female presents the office today for postop visit status post right first and second digit toenail removals.  She states that she is doing well and she denies any drainage and the pain is improved.  She did follow-up with vascular surgery and she again states that she does not want to have the procedure to check her blood flow.  She denies any fevers or chills.  She has no other concerns.   ? ?Objective: ?AAO x3, NAD ?DP/PT pulses palpable bilaterally, CRT less than 3 seconds ?Status post removal of the right first and second digit toenails.  Nailbeds appear to be healing well.  Small amount of scabbing still present with small opening of the second digit nail bed.  Is no probing, undermining or tunneling.  No surrounding erythema, ascending cellulitis.  No fluctuation or crepitation.  There is no malodor. ?No pain with calf compression, swelling, warmth, erythema ? ? ? ? ?Assessment: ?66 year old female status post total nail avulsions ? ?Plan: ?-All treatment options discussed with the patient including all alternatives, risks, complications.  ?-Procedure is considered to be healing well.  I cleansed with saline and antibiotic ointment and a bandage applied.  I will continue with daily dressing changes for now.  Can start to leave the area open at nighttime. ?-Declines any further vascular intervention at this time. ?-Monitor for any clinical signs or symptoms of infection and directed to call the office immediately should any occur or go to the ER. ?-Patient encouraged to call the office with any questions, concerns, change in symptoms.  ? ?Vivi Barrack DPM ? ?

## 2022-01-11 DIAGNOSIS — L97512 Non-pressure chronic ulcer of other part of right foot with fat layer exposed: Secondary | ICD-10-CM | POA: Diagnosis not present

## 2022-01-12 DIAGNOSIS — E11628 Type 2 diabetes mellitus with other skin complications: Secondary | ICD-10-CM | POA: Diagnosis not present

## 2022-01-12 DIAGNOSIS — L03031 Cellulitis of right toe: Secondary | ICD-10-CM | POA: Diagnosis not present

## 2022-01-12 DIAGNOSIS — S91204D Unspecified open wound of right lesser toe(s) with damage to nail, subsequent encounter: Secondary | ICD-10-CM | POA: Diagnosis not present

## 2022-01-12 DIAGNOSIS — M6281 Muscle weakness (generalized): Secondary | ICD-10-CM | POA: Diagnosis not present

## 2022-01-17 DIAGNOSIS — I2510 Atherosclerotic heart disease of native coronary artery without angina pectoris: Secondary | ICD-10-CM | POA: Diagnosis not present

## 2022-01-17 DIAGNOSIS — I10 Essential (primary) hypertension: Secondary | ICD-10-CM | POA: Diagnosis not present

## 2022-01-17 DIAGNOSIS — K219 Gastro-esophageal reflux disease without esophagitis: Secondary | ICD-10-CM | POA: Diagnosis not present

## 2022-01-17 DIAGNOSIS — E119 Type 2 diabetes mellitus without complications: Secondary | ICD-10-CM | POA: Diagnosis not present

## 2022-01-19 DIAGNOSIS — E11628 Type 2 diabetes mellitus with other skin complications: Secondary | ICD-10-CM | POA: Diagnosis not present

## 2022-01-19 DIAGNOSIS — M6281 Muscle weakness (generalized): Secondary | ICD-10-CM | POA: Diagnosis not present

## 2022-01-19 DIAGNOSIS — S91204D Unspecified open wound of right lesser toe(s) with damage to nail, subsequent encounter: Secondary | ICD-10-CM | POA: Diagnosis not present

## 2022-01-19 DIAGNOSIS — L03031 Cellulitis of right toe: Secondary | ICD-10-CM | POA: Diagnosis not present

## 2022-01-20 ENCOUNTER — Ambulatory Visit: Payer: Medicare Other | Admitting: Podiatry

## 2022-01-20 DIAGNOSIS — D519 Vitamin B12 deficiency anemia, unspecified: Secondary | ICD-10-CM | POA: Diagnosis not present

## 2022-01-20 DIAGNOSIS — R7989 Other specified abnormal findings of blood chemistry: Secondary | ICD-10-CM | POA: Diagnosis not present

## 2022-01-20 DIAGNOSIS — E119 Type 2 diabetes mellitus without complications: Secondary | ICD-10-CM | POA: Diagnosis not present

## 2022-02-01 DIAGNOSIS — M79674 Pain in right toe(s): Secondary | ICD-10-CM | POA: Diagnosis not present

## 2022-02-01 DIAGNOSIS — M79675 Pain in left toe(s): Secondary | ICD-10-CM | POA: Diagnosis not present

## 2022-02-01 DIAGNOSIS — L603 Nail dystrophy: Secondary | ICD-10-CM | POA: Diagnosis not present

## 2022-02-07 DIAGNOSIS — I6330 Cerebral infarction due to thrombosis of unspecified cerebral artery: Secondary | ICD-10-CM | POA: Diagnosis not present

## 2022-02-07 DIAGNOSIS — E11649 Type 2 diabetes mellitus with hypoglycemia without coma: Secondary | ICD-10-CM | POA: Diagnosis not present

## 2022-02-07 DIAGNOSIS — I10 Essential (primary) hypertension: Secondary | ICD-10-CM | POA: Diagnosis not present

## 2022-02-08 ENCOUNTER — Encounter: Payer: Self-pay | Admitting: Vascular Surgery

## 2022-02-08 ENCOUNTER — Ambulatory Visit (INDEPENDENT_AMBULATORY_CARE_PROVIDER_SITE_OTHER): Payer: Medicare Other | Admitting: Vascular Surgery

## 2022-02-08 VITALS — BP 178/91 | HR 68 | Temp 97.2°F | Resp 14 | Ht 64.0 in | Wt 160.0 lb

## 2022-02-08 DIAGNOSIS — I70221 Atherosclerosis of native arteries of extremities with rest pain, right leg: Secondary | ICD-10-CM

## 2022-02-08 NOTE — Progress Notes (Signed)
? ? ?Patient name: Lauren Pellegriniileen Creese-Joslyn MRN: [id number] DOB: 1956/07/13 Sex: female ? ?REASON FOR CONSULT: 1 month follow-up, right leg wound check ? ?HPI: ?Lauren Pellegriniileen Creese-Joslyn is a 66 y.o. female, with history of diabetes, hypertension, hyperlipidemia, previous stroke that was previously referred for evaluation of PAD of her right lower extremity.  Patient was referred by podiatry given she had an abscess in her right foot requiring removal of the first and second toenails on 12/17/2021.  She had noninvasive imaging that showed ABIs of 0.93 on the right monophasic and 0.98 on the left monophasic.  She lives in a nursing facility but is able to stand and transfer.  I offered her a lower extremity arteriogram as I was concerned about her inflow for healing last month and she declined and felt the wounds were healing.  I recommended 1 month follow-up.  Today she feels the wounds have completely healed.  She has no pain in the foot. ? ?Past Medical History:  ?Diagnosis Date  ? Chronic lower back pain   ? Depression   ? Facial cellulitis 04/17/2018  ? HLD (hyperlipidemia)   ? Hypertension   ? Type II diabetes mellitus (HCC)   ? ? ?Past Surgical History:  ?Procedure Laterality Date  ? BUBBLE STUDY  08/06/2019  ? Procedure: BUBBLE STUDY;  Surgeon: Chrystie NoseHilty, Kenneth C, MD;  [facility];  Service: Cardiovascular;;  ? CATARACT EXTRACTION W/ INTRAOCULAR LENS  IMPLANT, BILATERAL Bilateral   ? EYE SURGERY Bilateral   ? 2016  ? fatty tissue removal from upper back    ? 1998  ? INGUINAL HERNIA REPAIR Left   ? INTRAMEDULLARY (IM) NAIL INTERTROCHANTERIC Right 10/17/2019  ? Procedure: INTRAMEDULLARY (IM) NAIL INTERTROCHANTRIC;  Surgeon: Yolonda Kidaogers, Jason Patrick, MD;  [facility];  Service: Orthopedics;  Laterality: Right;  ? IRRIGATION AND DEBRIDEMENT FOOT Right 12/16/2021  ? Procedure: IRRIGATION AND DEBRIDEMENT FOOT;  Surgeon: Vivi BarrackWagoner, Matthew R, DPM;  Location: [facility] ORS;  Service: Podiatry;  Laterality: Right;  ? MYOMECTOMY     ? ROBOTIC ASSISTED LAPAROSCOPIC VAGINAL HYSTERECTOMY WITH FIBROID REMOVAL    ? 2009  ? TEE WITHOUT CARDIOVERSION N/A 08/06/2019  ? Procedure: TRANSESOPHAGEAL ECHOCARDIOGRAM (TEE);  Surgeon: Chrystie NoseHilty, Kenneth C, MD;  [facility];  Service: Cardiovascular;  Laterality: N/A;  ? ? ?Family History  ?Problem Relation Age of Onset  ? Hypertension Mother   ? Pancreatic cancer Brother   ? Stroke Neg Hx   ? ? ?SOCIAL HISTORY: ?Social History  ? ?Socioeconomic History  ? Marital status: Divorced  ?  Spouse name: Not on file  ? Number of children: 1  ? Years of education: Not on file  ? Highest education level: Not on file  ?Occupational History  ? Occupation: unemployed  ?Tobacco Use  ? Smoking status: Never  ? Smokeless tobacco: Never  ?Vaping Use  ? Vaping Use: Never used  ?Substance and Sexual Activity  ? Alcohol use: Not Currently  ? Drug use: Never  ? Sexual activity: Not on file  ?Other Topics Concern  ? Not on file  ?Social History Narrative  ? Not on file  ? ?Social Determinants of Health  ? ?Financial Resource Strain: Not on file  ?Food Insecurity: Not on file  ?Transportation Needs: Not on file  ?Physical Activity: Not on file  ?Stress: Not on file  ?Social Connections: Not on file  ?Intimate Partner Violence: Not on file  ? ? ?Allergies  ?Allergen Reactions  ? Januvia [Sitagliptin] Swelling  ? Metformin And  Related Nausea And Vomiting  ? ? ?Current Outpatient Medications  ?Medication Sig Dispense Refill  ? acetaminophen (TYLENOL) 325 MG tablet Take 650 mg by mouth every 6 (six) hours as needed for mild pain or fever.    ? amLODipine (NORVASC) 10 MG tablet Take 10 mg by mouth daily.  3  ? aspirin EC 81 MG EC tablet Take 1 tablet (81 mg total) by mouth daily. 30 tablet 0  ? atenolol (TENORMIN) 100 MG tablet Take 1 tablet by mouth daily (Patient taking differently: Take 100 mg by mouth daily.) 30 tablet 6  ? atorvastatin (LIPITOR) 40 MG tablet Take 1 tablet (40 mg total) by mouth daily. 30 tablet 0  ? B  Complex-C (B-COMPLEX WITH VITAMIN C) tablet Take 1 tablet by mouth daily. 30 tablet 0  ? cloNIDine (CATAPRES - DOSED IN MG/24 HR) 0.2 mg/24hr patch Place 0.2 mg onto the skin once a week. Saturday    ? clopidogrel (PLAVIX) 75 MG tablet Take 1 tablet (75 mg total) by mouth daily. 30 tablet 0  ? diclofenac Sodium (VOLTAREN) 1 % GEL Apply 2 g topically 2 (two) times daily.    ? Emollient (EMOLLIA-LOTION EX) Apply 1 application topically 2 (two) times daily. To hands    ? Ensure (ENSURE) Take 237 mLs by mouth daily.    ? ferrous sulfate 325 (65 FE) MG tablet Take 1 tablet (325 mg total) by mouth 2 (two) times daily with a meal. 60 tablet 0  ? HUMALOG KWIKPEN 100 UNIT/ML KwikPen Inject 0-5 Units into the skin 3 (three) times daily with meals. CBG = 0-100 = 0 units CBG 101-400 = 5 unts    ? hydrALAZINE (APRESOLINE) 100 MG tablet Take 1 tablet (100 mg total) by mouth every 8 (eight) hours. 90 tablet 0  ? HYDROcodone-acetaminophen (NORCO/VICODIN) 5-325 MG tablet Take 1 tablet by mouth every 6 (six) hours as needed for up to 4 doses for moderate pain. 4 tablet 0  ? LANTUS SOLOSTAR 100 UNIT/ML Solostar Pen Inject 13 Units into the skin daily.    ? mirtazapine (REMERON) 7.5 MG tablet Take 7.5 mg by mouth at bedtime.    ? Multiple Vitamin (MULTIVITAMIN WITH MINERALS) TABS tablet Take 1 tablet by mouth daily. 30 tablet 0  ? OVER THE COUNTER MEDICATION Take 237 mLs by mouth 2 (two) times daily. Health Shake for Nutritional supplement    ? pantoprazole (PROTONIX) 20 MG tablet Take 20 mg by mouth daily.    ? polyethylene glycol (MIRALAX / GLYCOLAX) 17 g packet Take 17 g by mouth daily. 14 each 0  ? senna-docusate (SENOKOT-S) 8.6-50 MG tablet Take 2 tablets by mouth 2 (two) times daily. 120 tablet 0  ? ?No current facility-administered medications for this visit.  ? ? ?REVIEW OF SYSTEMS:  ?[X]  denotes positive finding, [ ]  denotes negative finding ?Cardiac  Comments:  ?Chest pain or chest pressure:    ?Shortness of breath upon  exertion:    ?Short of breath when lying flat:    ?Irregular heart rhythm:    ?    ?Vascular    ?Pain in calf, thigh, or hip brought on by ambulation:    ?Pain in feet at night that wakes you up from your sleep:     ?Blood clot in your veins:    ?Leg swelling:     ?    ?Pulmonary    ?Oxygen at home:    ?Productive cough:     ?Wheezing:     ?    ?  Neurologic    ?Sudden weakness in arms or legs:     ?Sudden numbness in arms or legs:     ?Sudden onset of difficulty speaking or slurred speech:    ?Temporary loss of vision in one eye:     ?Problems with dizziness:     ?    ?Gastrointestinal    ?Blood in stool:     ?Vomited blood:     ?    ?Genitourinary    ?Burning when urinating:     ?Blood in urine:    ?    ?Psychiatric    ?Major depression:     ?    ?Hematologic    ?Bleeding problems:    ?Problems with blood clotting too easily:    ?    ?Skin    ?Rashes or ulcers:    ?    ?Constitutional    ?Fever or chills:    ? ? ?PHYSICAL EXAM: ?Vitals:  ? 02/08/22 1359  ?BP: (!) 178/91  ?Pulse: 68  ?Resp: 14  ?Temp: (!) 97.2 ?F (36.2 ?C)  ?TempSrc: Temporal  ?SpO2: 96%  ?Weight: 160 lb (72.6 kg)  ?Height: 5\' 4"  (1.626 m)  ? ? ?GENERAL: The patient is a well-nourished female, in no acute distress. The vital signs are documented above. ?CARDIAC: There is a regular rate and rhythm.  ?VASCULAR:  ?Bilateral femoral pulses palpable ?No palpable pedal pulses ?Right first and second toe wounds healed as pictured below ?PULMONARY: No respiratory distress. ? ? ? ? ? ? ?DATA:  ? ?ABIs 12/17/21 are 0.93 on the right monophasic and 0.98 on the left monophasic ? ?Assessment/Plan: ? ?66 year old female with history of diabetes, hypertension, hyperlipidemia, previous CVA in a nursing facility that previously presented for evaluation of PAD of her right lower extremity.  She was referred by podiatry given she had an abscess in her right foot requiring removal of the first and second toenails on 12/17/2021.  I previously offered a right lower  extremity arteriogram with possible intervention last month.  I was worried about her inflow for healing.  She declined and felt the wounds were healing.  On 1 month follow-up today she's shown remarkable improvement

## 2022-02-16 DIAGNOSIS — I10 Essential (primary) hypertension: Secondary | ICD-10-CM | POA: Diagnosis not present

## 2022-02-16 DIAGNOSIS — E11649 Type 2 diabetes mellitus with hypoglycemia without coma: Secondary | ICD-10-CM | POA: Diagnosis not present

## 2022-02-18 DIAGNOSIS — N390 Urinary tract infection, site not specified: Secondary | ICD-10-CM | POA: Diagnosis not present

## 2022-02-23 DIAGNOSIS — N390 Urinary tract infection, site not specified: Secondary | ICD-10-CM | POA: Diagnosis not present

## 2022-03-03 DIAGNOSIS — I2510 Atherosclerotic heart disease of native coronary artery without angina pectoris: Secondary | ICD-10-CM | POA: Diagnosis not present

## 2022-03-03 DIAGNOSIS — N390 Urinary tract infection, site not specified: Secondary | ICD-10-CM | POA: Diagnosis not present

## 2022-03-03 DIAGNOSIS — I10 Essential (primary) hypertension: Secondary | ICD-10-CM | POA: Diagnosis not present

## 2022-03-03 DIAGNOSIS — E119 Type 2 diabetes mellitus without complications: Secondary | ICD-10-CM | POA: Diagnosis not present

## 2022-03-08 DIAGNOSIS — E11649 Type 2 diabetes mellitus with hypoglycemia without coma: Secondary | ICD-10-CM | POA: Diagnosis not present

## 2022-03-08 DIAGNOSIS — I10 Essential (primary) hypertension: Secondary | ICD-10-CM | POA: Diagnosis not present

## 2022-03-16 DIAGNOSIS — I10 Essential (primary) hypertension: Secondary | ICD-10-CM | POA: Diagnosis not present

## 2022-03-16 DIAGNOSIS — I2510 Atherosclerotic heart disease of native coronary artery without angina pectoris: Secondary | ICD-10-CM | POA: Diagnosis not present

## 2022-03-16 DIAGNOSIS — D649 Anemia, unspecified: Secondary | ICD-10-CM | POA: Diagnosis not present

## 2022-03-16 DIAGNOSIS — E119 Type 2 diabetes mellitus without complications: Secondary | ICD-10-CM | POA: Diagnosis not present

## 2022-03-24 DIAGNOSIS — R7309 Other abnormal glucose: Secondary | ICD-10-CM | POA: Diagnosis not present

## 2022-03-24 DIAGNOSIS — I10 Essential (primary) hypertension: Secondary | ICD-10-CM | POA: Diagnosis not present

## 2022-03-25 DIAGNOSIS — E11649 Type 2 diabetes mellitus with hypoglycemia without coma: Secondary | ICD-10-CM | POA: Diagnosis not present

## 2022-03-25 DIAGNOSIS — I10 Essential (primary) hypertension: Secondary | ICD-10-CM | POA: Diagnosis not present

## 2022-04-04 DIAGNOSIS — E119 Type 2 diabetes mellitus without complications: Secondary | ICD-10-CM | POA: Diagnosis not present

## 2022-04-04 DIAGNOSIS — Z91199 Patient's noncompliance with other medical treatment and regimen due to unspecified reason: Secondary | ICD-10-CM | POA: Diagnosis not present

## 2022-04-13 DIAGNOSIS — E1149 Type 2 diabetes mellitus with other diabetic neurological complication: Secondary | ICD-10-CM | POA: Diagnosis not present

## 2022-04-27 DIAGNOSIS — E11649 Type 2 diabetes mellitus with hypoglycemia without coma: Secondary | ICD-10-CM | POA: Diagnosis not present

## 2022-04-27 DIAGNOSIS — I10 Essential (primary) hypertension: Secondary | ICD-10-CM | POA: Diagnosis not present

## 2022-05-16 DIAGNOSIS — E119 Type 2 diabetes mellitus without complications: Secondary | ICD-10-CM | POA: Diagnosis not present

## 2022-05-16 DIAGNOSIS — I10 Essential (primary) hypertension: Secondary | ICD-10-CM | POA: Diagnosis not present

## 2022-05-16 DIAGNOSIS — I2510 Atherosclerotic heart disease of native coronary artery without angina pectoris: Secondary | ICD-10-CM | POA: Diagnosis not present

## 2022-05-16 DIAGNOSIS — D649 Anemia, unspecified: Secondary | ICD-10-CM | POA: Diagnosis not present

## 2022-05-25 DIAGNOSIS — I10 Essential (primary) hypertension: Secondary | ICD-10-CM | POA: Diagnosis not present

## 2022-05-25 DIAGNOSIS — E11649 Type 2 diabetes mellitus with hypoglycemia without coma: Secondary | ICD-10-CM | POA: Diagnosis not present

## 2022-06-03 DIAGNOSIS — J810 Acute pulmonary edema: Secondary | ICD-10-CM | POA: Diagnosis not present

## 2022-06-03 DIAGNOSIS — I509 Heart failure, unspecified: Secondary | ICD-10-CM | POA: Diagnosis not present

## 2022-06-03 DIAGNOSIS — U071 COVID-19: Secondary | ICD-10-CM | POA: Diagnosis not present

## 2022-06-07 DIAGNOSIS — H109 Unspecified conjunctivitis: Secondary | ICD-10-CM | POA: Diagnosis not present

## 2022-06-08 DIAGNOSIS — I509 Heart failure, unspecified: Secondary | ICD-10-CM | POA: Diagnosis not present

## 2022-06-08 DIAGNOSIS — U071 COVID-19: Secondary | ICD-10-CM | POA: Diagnosis not present

## 2022-06-08 DIAGNOSIS — J810 Acute pulmonary edema: Secondary | ICD-10-CM | POA: Diagnosis not present

## 2022-06-09 DIAGNOSIS — J810 Acute pulmonary edema: Secondary | ICD-10-CM | POA: Diagnosis not present

## 2022-06-09 DIAGNOSIS — U071 COVID-19: Secondary | ICD-10-CM | POA: Diagnosis not present

## 2022-06-09 DIAGNOSIS — I509 Heart failure, unspecified: Secondary | ICD-10-CM | POA: Diagnosis not present

## 2022-06-10 DIAGNOSIS — I509 Heart failure, unspecified: Secondary | ICD-10-CM | POA: Diagnosis not present

## 2022-06-10 DIAGNOSIS — J810 Acute pulmonary edema: Secondary | ICD-10-CM | POA: Diagnosis not present

## 2022-06-10 DIAGNOSIS — U071 COVID-19: Secondary | ICD-10-CM | POA: Diagnosis not present

## 2022-06-13 DIAGNOSIS — J810 Acute pulmonary edema: Secondary | ICD-10-CM | POA: Diagnosis not present

## 2022-06-13 DIAGNOSIS — U071 COVID-19: Secondary | ICD-10-CM | POA: Diagnosis not present

## 2022-06-13 DIAGNOSIS — H109 Unspecified conjunctivitis: Secondary | ICD-10-CM | POA: Diagnosis not present

## 2022-06-13 DIAGNOSIS — I509 Heart failure, unspecified: Secondary | ICD-10-CM | POA: Diagnosis not present

## 2022-06-14 DIAGNOSIS — I509 Heart failure, unspecified: Secondary | ICD-10-CM | POA: Diagnosis not present

## 2022-06-14 DIAGNOSIS — J810 Acute pulmonary edema: Secondary | ICD-10-CM | POA: Diagnosis not present

## 2022-06-14 DIAGNOSIS — U071 COVID-19: Secondary | ICD-10-CM | POA: Diagnosis not present

## 2022-06-17 DIAGNOSIS — I509 Heart failure, unspecified: Secondary | ICD-10-CM | POA: Diagnosis not present

## 2022-06-17 DIAGNOSIS — U071 COVID-19: Secondary | ICD-10-CM | POA: Diagnosis not present

## 2022-06-17 DIAGNOSIS — J810 Acute pulmonary edema: Secondary | ICD-10-CM | POA: Diagnosis not present

## 2022-06-20 DIAGNOSIS — H109 Unspecified conjunctivitis: Secondary | ICD-10-CM | POA: Diagnosis not present

## 2022-06-21 DIAGNOSIS — U071 COVID-19: Secondary | ICD-10-CM | POA: Diagnosis not present

## 2022-06-21 DIAGNOSIS — J810 Acute pulmonary edema: Secondary | ICD-10-CM | POA: Diagnosis not present

## 2022-06-21 DIAGNOSIS — I509 Heart failure, unspecified: Secondary | ICD-10-CM | POA: Diagnosis not present

## 2022-06-22 DIAGNOSIS — J810 Acute pulmonary edema: Secondary | ICD-10-CM | POA: Diagnosis not present

## 2022-06-22 DIAGNOSIS — U071 COVID-19: Secondary | ICD-10-CM | POA: Diagnosis not present

## 2022-06-22 DIAGNOSIS — I509 Heart failure, unspecified: Secondary | ICD-10-CM | POA: Diagnosis not present

## 2022-06-27 DIAGNOSIS — U071 COVID-19: Secondary | ICD-10-CM | POA: Diagnosis not present

## 2022-06-27 DIAGNOSIS — J810 Acute pulmonary edema: Secondary | ICD-10-CM | POA: Diagnosis not present

## 2022-06-27 DIAGNOSIS — H109 Unspecified conjunctivitis: Secondary | ICD-10-CM | POA: Diagnosis not present

## 2022-06-27 DIAGNOSIS — I6330 Cerebral infarction due to thrombosis of unspecified cerebral artery: Secondary | ICD-10-CM | POA: Diagnosis not present

## 2022-06-27 DIAGNOSIS — I509 Heart failure, unspecified: Secondary | ICD-10-CM | POA: Diagnosis not present

## 2022-06-28 DIAGNOSIS — I509 Heart failure, unspecified: Secondary | ICD-10-CM | POA: Diagnosis not present

## 2022-06-28 DIAGNOSIS — U071 COVID-19: Secondary | ICD-10-CM | POA: Diagnosis not present

## 2022-06-28 DIAGNOSIS — J810 Acute pulmonary edema: Secondary | ICD-10-CM | POA: Diagnosis not present

## 2022-06-28 DIAGNOSIS — I6330 Cerebral infarction due to thrombosis of unspecified cerebral artery: Secondary | ICD-10-CM | POA: Diagnosis not present

## 2022-06-29 DIAGNOSIS — I509 Heart failure, unspecified: Secondary | ICD-10-CM | POA: Diagnosis not present

## 2022-06-29 DIAGNOSIS — J810 Acute pulmonary edema: Secondary | ICD-10-CM | POA: Diagnosis not present

## 2022-06-29 DIAGNOSIS — I6330 Cerebral infarction due to thrombosis of unspecified cerebral artery: Secondary | ICD-10-CM | POA: Diagnosis not present

## 2022-06-29 DIAGNOSIS — U071 COVID-19: Secondary | ICD-10-CM | POA: Diagnosis not present

## 2022-06-30 DIAGNOSIS — H109 Unspecified conjunctivitis: Secondary | ICD-10-CM | POA: Diagnosis not present

## 2022-06-30 DIAGNOSIS — I10 Essential (primary) hypertension: Secondary | ICD-10-CM | POA: Diagnosis not present

## 2022-06-30 DIAGNOSIS — K219 Gastro-esophageal reflux disease without esophagitis: Secondary | ICD-10-CM | POA: Diagnosis not present

## 2022-06-30 DIAGNOSIS — E119 Type 2 diabetes mellitus without complications: Secondary | ICD-10-CM | POA: Diagnosis not present

## 2022-06-30 DIAGNOSIS — R5381 Other malaise: Secondary | ICD-10-CM | POA: Diagnosis not present

## 2022-07-01 DIAGNOSIS — J810 Acute pulmonary edema: Secondary | ICD-10-CM | POA: Diagnosis not present

## 2022-07-01 DIAGNOSIS — I6330 Cerebral infarction due to thrombosis of unspecified cerebral artery: Secondary | ICD-10-CM | POA: Diagnosis not present

## 2022-07-01 DIAGNOSIS — U071 COVID-19: Secondary | ICD-10-CM | POA: Diagnosis not present

## 2022-07-01 DIAGNOSIS — I509 Heart failure, unspecified: Secondary | ICD-10-CM | POA: Diagnosis not present

## 2022-07-04 DIAGNOSIS — I509 Heart failure, unspecified: Secondary | ICD-10-CM | POA: Diagnosis not present

## 2022-07-04 DIAGNOSIS — U071 COVID-19: Secondary | ICD-10-CM | POA: Diagnosis not present

## 2022-07-04 DIAGNOSIS — I6330 Cerebral infarction due to thrombosis of unspecified cerebral artery: Secondary | ICD-10-CM | POA: Diagnosis not present

## 2022-07-04 DIAGNOSIS — I10 Essential (primary) hypertension: Secondary | ICD-10-CM | POA: Diagnosis not present

## 2022-07-04 DIAGNOSIS — E11649 Type 2 diabetes mellitus with hypoglycemia without coma: Secondary | ICD-10-CM | POA: Diagnosis not present

## 2022-07-04 DIAGNOSIS — J810 Acute pulmonary edema: Secondary | ICD-10-CM | POA: Diagnosis not present

## 2022-07-06 DIAGNOSIS — I509 Heart failure, unspecified: Secondary | ICD-10-CM | POA: Diagnosis not present

## 2022-07-06 DIAGNOSIS — U071 COVID-19: Secondary | ICD-10-CM | POA: Diagnosis not present

## 2022-07-06 DIAGNOSIS — J810 Acute pulmonary edema: Secondary | ICD-10-CM | POA: Diagnosis not present

## 2022-07-06 DIAGNOSIS — I6330 Cerebral infarction due to thrombosis of unspecified cerebral artery: Secondary | ICD-10-CM | POA: Diagnosis not present

## 2022-07-08 DIAGNOSIS — H10012 Acute follicular conjunctivitis, left eye: Secondary | ICD-10-CM | POA: Diagnosis not present

## 2022-07-08 DIAGNOSIS — U071 COVID-19: Secondary | ICD-10-CM | POA: Diagnosis not present

## 2022-07-08 DIAGNOSIS — H10023 Other mucopurulent conjunctivitis, bilateral: Secondary | ICD-10-CM | POA: Diagnosis not present

## 2022-07-08 DIAGNOSIS — J810 Acute pulmonary edema: Secondary | ICD-10-CM | POA: Diagnosis not present

## 2022-07-08 DIAGNOSIS — I6330 Cerebral infarction due to thrombosis of unspecified cerebral artery: Secondary | ICD-10-CM | POA: Diagnosis not present

## 2022-07-08 DIAGNOSIS — I509 Heart failure, unspecified: Secondary | ICD-10-CM | POA: Diagnosis not present

## 2022-07-11 DIAGNOSIS — H109 Unspecified conjunctivitis: Secondary | ICD-10-CM | POA: Diagnosis not present

## 2022-07-12 DIAGNOSIS — J810 Acute pulmonary edema: Secondary | ICD-10-CM | POA: Diagnosis not present

## 2022-07-12 DIAGNOSIS — I6330 Cerebral infarction due to thrombosis of unspecified cerebral artery: Secondary | ICD-10-CM | POA: Diagnosis not present

## 2022-07-12 DIAGNOSIS — I509 Heart failure, unspecified: Secondary | ICD-10-CM | POA: Diagnosis not present

## 2022-07-12 DIAGNOSIS — U071 COVID-19: Secondary | ICD-10-CM | POA: Diagnosis not present

## 2022-07-13 DIAGNOSIS — U071 COVID-19: Secondary | ICD-10-CM | POA: Diagnosis not present

## 2022-07-13 DIAGNOSIS — I509 Heart failure, unspecified: Secondary | ICD-10-CM | POA: Diagnosis not present

## 2022-07-13 DIAGNOSIS — J810 Acute pulmonary edema: Secondary | ICD-10-CM | POA: Diagnosis not present

## 2022-07-13 DIAGNOSIS — I6330 Cerebral infarction due to thrombosis of unspecified cerebral artery: Secondary | ICD-10-CM | POA: Diagnosis not present

## 2022-07-15 DIAGNOSIS — I509 Heart failure, unspecified: Secondary | ICD-10-CM | POA: Diagnosis not present

## 2022-07-15 DIAGNOSIS — I6330 Cerebral infarction due to thrombosis of unspecified cerebral artery: Secondary | ICD-10-CM | POA: Diagnosis not present

## 2022-07-15 DIAGNOSIS — U071 COVID-19: Secondary | ICD-10-CM | POA: Diagnosis not present

## 2022-07-15 DIAGNOSIS — J810 Acute pulmonary edema: Secondary | ICD-10-CM | POA: Diagnosis not present

## 2022-07-16 DIAGNOSIS — J810 Acute pulmonary edema: Secondary | ICD-10-CM | POA: Diagnosis not present

## 2022-07-16 DIAGNOSIS — I509 Heart failure, unspecified: Secondary | ICD-10-CM | POA: Diagnosis not present

## 2022-07-16 DIAGNOSIS — I6330 Cerebral infarction due to thrombosis of unspecified cerebral artery: Secondary | ICD-10-CM | POA: Diagnosis not present

## 2022-07-16 DIAGNOSIS — U071 COVID-19: Secondary | ICD-10-CM | POA: Diagnosis not present

## 2022-07-18 DIAGNOSIS — I509 Heart failure, unspecified: Secondary | ICD-10-CM | POA: Diagnosis not present

## 2022-07-18 DIAGNOSIS — J810 Acute pulmonary edema: Secondary | ICD-10-CM | POA: Diagnosis not present

## 2022-07-18 DIAGNOSIS — I6330 Cerebral infarction due to thrombosis of unspecified cerebral artery: Secondary | ICD-10-CM | POA: Diagnosis not present

## 2022-07-18 DIAGNOSIS — U071 COVID-19: Secondary | ICD-10-CM | POA: Diagnosis not present

## 2022-07-20 DIAGNOSIS — H109 Unspecified conjunctivitis: Secondary | ICD-10-CM | POA: Diagnosis not present

## 2022-07-25 DIAGNOSIS — L603 Nail dystrophy: Secondary | ICD-10-CM | POA: Diagnosis not present

## 2022-08-08 DIAGNOSIS — I10 Essential (primary) hypertension: Secondary | ICD-10-CM | POA: Diagnosis not present

## 2022-08-08 DIAGNOSIS — E11649 Type 2 diabetes mellitus with hypoglycemia without coma: Secondary | ICD-10-CM | POA: Diagnosis not present

## 2022-08-17 DIAGNOSIS — R52 Pain, unspecified: Secondary | ICD-10-CM | POA: Diagnosis not present

## 2022-09-02 DIAGNOSIS — I6330 Cerebral infarction due to thrombosis of unspecified cerebral artery: Secondary | ICD-10-CM | POA: Diagnosis not present

## 2022-09-02 DIAGNOSIS — M6281 Muscle weakness (generalized): Secondary | ICD-10-CM | POA: Diagnosis not present

## 2022-09-02 DIAGNOSIS — R2689 Other abnormalities of gait and mobility: Secondary | ICD-10-CM | POA: Diagnosis not present

## 2022-09-06 DIAGNOSIS — M6281 Muscle weakness (generalized): Secondary | ICD-10-CM | POA: Diagnosis not present

## 2022-09-06 DIAGNOSIS — R2689 Other abnormalities of gait and mobility: Secondary | ICD-10-CM | POA: Diagnosis not present

## 2022-09-06 DIAGNOSIS — I6330 Cerebral infarction due to thrombosis of unspecified cerebral artery: Secondary | ICD-10-CM | POA: Diagnosis not present

## 2022-09-07 DIAGNOSIS — R2689 Other abnormalities of gait and mobility: Secondary | ICD-10-CM | POA: Diagnosis not present

## 2022-09-07 DIAGNOSIS — M6281 Muscle weakness (generalized): Secondary | ICD-10-CM | POA: Diagnosis not present

## 2022-09-07 DIAGNOSIS — I6330 Cerebral infarction due to thrombosis of unspecified cerebral artery: Secondary | ICD-10-CM | POA: Diagnosis not present

## 2022-09-08 DIAGNOSIS — R2689 Other abnormalities of gait and mobility: Secondary | ICD-10-CM | POA: Diagnosis not present

## 2022-09-08 DIAGNOSIS — I6330 Cerebral infarction due to thrombosis of unspecified cerebral artery: Secondary | ICD-10-CM | POA: Diagnosis not present

## 2022-09-08 DIAGNOSIS — M6281 Muscle weakness (generalized): Secondary | ICD-10-CM | POA: Diagnosis not present

## 2022-09-09 DIAGNOSIS — M6281 Muscle weakness (generalized): Secondary | ICD-10-CM | POA: Diagnosis not present

## 2022-09-09 DIAGNOSIS — R2689 Other abnormalities of gait and mobility: Secondary | ICD-10-CM | POA: Diagnosis not present

## 2022-09-09 DIAGNOSIS — I6330 Cerebral infarction due to thrombosis of unspecified cerebral artery: Secondary | ICD-10-CM | POA: Diagnosis not present

## 2022-09-11 DIAGNOSIS — M6281 Muscle weakness (generalized): Secondary | ICD-10-CM | POA: Diagnosis not present

## 2022-09-11 DIAGNOSIS — I6330 Cerebral infarction due to thrombosis of unspecified cerebral artery: Secondary | ICD-10-CM | POA: Diagnosis not present

## 2022-09-11 DIAGNOSIS — R2689 Other abnormalities of gait and mobility: Secondary | ICD-10-CM | POA: Diagnosis not present

## 2022-09-13 DIAGNOSIS — R2689 Other abnormalities of gait and mobility: Secondary | ICD-10-CM | POA: Diagnosis not present

## 2022-09-13 DIAGNOSIS — I6330 Cerebral infarction due to thrombosis of unspecified cerebral artery: Secondary | ICD-10-CM | POA: Diagnosis not present

## 2022-09-13 DIAGNOSIS — M6281 Muscle weakness (generalized): Secondary | ICD-10-CM | POA: Diagnosis not present

## 2022-09-14 DIAGNOSIS — M6281 Muscle weakness (generalized): Secondary | ICD-10-CM | POA: Diagnosis not present

## 2022-09-14 DIAGNOSIS — I6330 Cerebral infarction due to thrombosis of unspecified cerebral artery: Secondary | ICD-10-CM | POA: Diagnosis not present

## 2022-09-14 DIAGNOSIS — R2689 Other abnormalities of gait and mobility: Secondary | ICD-10-CM | POA: Diagnosis not present

## 2022-09-16 DIAGNOSIS — I6330 Cerebral infarction due to thrombosis of unspecified cerebral artery: Secondary | ICD-10-CM | POA: Diagnosis not present

## 2022-09-16 DIAGNOSIS — R2689 Other abnormalities of gait and mobility: Secondary | ICD-10-CM | POA: Diagnosis not present

## 2022-09-16 DIAGNOSIS — M6281 Muscle weakness (generalized): Secondary | ICD-10-CM | POA: Diagnosis not present

## 2022-09-19 DIAGNOSIS — R2689 Other abnormalities of gait and mobility: Secondary | ICD-10-CM | POA: Diagnosis not present

## 2022-09-19 DIAGNOSIS — I6330 Cerebral infarction due to thrombosis of unspecified cerebral artery: Secondary | ICD-10-CM | POA: Diagnosis not present

## 2022-09-19 DIAGNOSIS — M6281 Muscle weakness (generalized): Secondary | ICD-10-CM | POA: Diagnosis not present

## 2022-09-21 DIAGNOSIS — I10 Essential (primary) hypertension: Secondary | ICD-10-CM | POA: Diagnosis not present

## 2022-09-21 DIAGNOSIS — E11649 Type 2 diabetes mellitus with hypoglycemia without coma: Secondary | ICD-10-CM | POA: Diagnosis not present

## 2022-09-22 DIAGNOSIS — M6281 Muscle weakness (generalized): Secondary | ICD-10-CM | POA: Diagnosis not present

## 2022-09-22 DIAGNOSIS — I6330 Cerebral infarction due to thrombosis of unspecified cerebral artery: Secondary | ICD-10-CM | POA: Diagnosis not present

## 2022-09-22 DIAGNOSIS — R2689 Other abnormalities of gait and mobility: Secondary | ICD-10-CM | POA: Diagnosis not present

## 2022-09-23 DIAGNOSIS — E559 Vitamin D deficiency, unspecified: Secondary | ICD-10-CM | POA: Diagnosis not present

## 2022-09-23 DIAGNOSIS — D519 Vitamin B12 deficiency anemia, unspecified: Secondary | ICD-10-CM | POA: Diagnosis not present

## 2022-09-23 DIAGNOSIS — E612 Magnesium deficiency: Secondary | ICD-10-CM | POA: Diagnosis not present

## 2022-09-23 DIAGNOSIS — E119 Type 2 diabetes mellitus without complications: Secondary | ICD-10-CM | POA: Diagnosis not present

## 2022-09-28 DIAGNOSIS — I10 Essential (primary) hypertension: Secondary | ICD-10-CM | POA: Diagnosis not present

## 2022-09-28 DIAGNOSIS — R52 Pain, unspecified: Secondary | ICD-10-CM | POA: Diagnosis not present

## 2022-09-28 DIAGNOSIS — E785 Hyperlipidemia, unspecified: Secondary | ICD-10-CM | POA: Diagnosis not present

## 2022-09-28 DIAGNOSIS — D649 Anemia, unspecified: Secondary | ICD-10-CM | POA: Diagnosis not present

## 2022-09-28 DIAGNOSIS — I2510 Atherosclerotic heart disease of native coronary artery without angina pectoris: Secondary | ICD-10-CM | POA: Diagnosis not present

## 2022-09-28 DIAGNOSIS — E119 Type 2 diabetes mellitus without complications: Secondary | ICD-10-CM | POA: Diagnosis not present

## 2022-10-14 DIAGNOSIS — E11649 Type 2 diabetes mellitus with hypoglycemia without coma: Secondary | ICD-10-CM | POA: Diagnosis not present

## 2022-10-14 DIAGNOSIS — I10 Essential (primary) hypertension: Secondary | ICD-10-CM | POA: Diagnosis not present

## 2022-10-27 DIAGNOSIS — N390 Urinary tract infection, site not specified: Secondary | ICD-10-CM | POA: Diagnosis not present

## 2022-10-27 DIAGNOSIS — B3731 Acute candidiasis of vulva and vagina: Secondary | ICD-10-CM | POA: Diagnosis not present

## 2022-10-27 DIAGNOSIS — E119 Type 2 diabetes mellitus without complications: Secondary | ICD-10-CM | POA: Diagnosis not present

## 2022-10-27 DIAGNOSIS — I10 Essential (primary) hypertension: Secondary | ICD-10-CM | POA: Diagnosis not present

## 2022-10-27 DIAGNOSIS — M545 Low back pain, unspecified: Secondary | ICD-10-CM | POA: Diagnosis not present

## 2022-10-27 DIAGNOSIS — M1990 Unspecified osteoarthritis, unspecified site: Secondary | ICD-10-CM | POA: Diagnosis not present

## 2022-10-27 DIAGNOSIS — L03031 Cellulitis of right toe: Secondary | ICD-10-CM | POA: Diagnosis not present

## 2022-11-16 DIAGNOSIS — E11649 Type 2 diabetes mellitus with hypoglycemia without coma: Secondary | ICD-10-CM | POA: Diagnosis not present

## 2022-11-16 DIAGNOSIS — I10 Essential (primary) hypertension: Secondary | ICD-10-CM | POA: Diagnosis not present

## 2022-11-25 DIAGNOSIS — M6281 Muscle weakness (generalized): Secondary | ICD-10-CM | POA: Diagnosis not present

## 2022-11-25 DIAGNOSIS — R279 Unspecified lack of coordination: Secondary | ICD-10-CM | POA: Diagnosis not present

## 2022-11-25 DIAGNOSIS — I6330 Cerebral infarction due to thrombosis of unspecified cerebral artery: Secondary | ICD-10-CM | POA: Diagnosis not present

## 2022-11-26 DIAGNOSIS — E559 Vitamin D deficiency, unspecified: Secondary | ICD-10-CM | POA: Diagnosis not present

## 2022-11-26 DIAGNOSIS — D519 Vitamin B12 deficiency anemia, unspecified: Secondary | ICD-10-CM | POA: Diagnosis not present

## 2022-11-26 DIAGNOSIS — E612 Magnesium deficiency: Secondary | ICD-10-CM | POA: Diagnosis not present

## 2022-11-26 DIAGNOSIS — I10 Essential (primary) hypertension: Secondary | ICD-10-CM | POA: Diagnosis not present

## 2022-11-26 DIAGNOSIS — M6281 Muscle weakness (generalized): Secondary | ICD-10-CM | POA: Diagnosis not present

## 2022-11-26 DIAGNOSIS — R279 Unspecified lack of coordination: Secondary | ICD-10-CM | POA: Diagnosis not present

## 2022-11-26 DIAGNOSIS — I6330 Cerebral infarction due to thrombosis of unspecified cerebral artery: Secondary | ICD-10-CM | POA: Diagnosis not present

## 2022-11-26 DIAGNOSIS — E119 Type 2 diabetes mellitus without complications: Secondary | ICD-10-CM | POA: Diagnosis not present

## 2022-11-27 DIAGNOSIS — M6281 Muscle weakness (generalized): Secondary | ICD-10-CM | POA: Diagnosis not present

## 2022-11-27 DIAGNOSIS — I6330 Cerebral infarction due to thrombosis of unspecified cerebral artery: Secondary | ICD-10-CM | POA: Diagnosis not present

## 2022-11-27 DIAGNOSIS — R279 Unspecified lack of coordination: Secondary | ICD-10-CM | POA: Diagnosis not present

## 2022-11-29 DIAGNOSIS — R279 Unspecified lack of coordination: Secondary | ICD-10-CM | POA: Diagnosis not present

## 2022-11-29 DIAGNOSIS — E785 Hyperlipidemia, unspecified: Secondary | ICD-10-CM | POA: Diagnosis not present

## 2022-11-29 DIAGNOSIS — I6330 Cerebral infarction due to thrombosis of unspecified cerebral artery: Secondary | ICD-10-CM | POA: Diagnosis not present

## 2022-11-29 DIAGNOSIS — M6281 Muscle weakness (generalized): Secondary | ICD-10-CM | POA: Diagnosis not present

## 2022-11-30 DIAGNOSIS — I6330 Cerebral infarction due to thrombosis of unspecified cerebral artery: Secondary | ICD-10-CM | POA: Diagnosis not present

## 2022-11-30 DIAGNOSIS — R279 Unspecified lack of coordination: Secondary | ICD-10-CM | POA: Diagnosis not present

## 2022-11-30 DIAGNOSIS — M6281 Muscle weakness (generalized): Secondary | ICD-10-CM | POA: Diagnosis not present

## 2022-12-02 DIAGNOSIS — I6330 Cerebral infarction due to thrombosis of unspecified cerebral artery: Secondary | ICD-10-CM | POA: Diagnosis not present

## 2022-12-02 DIAGNOSIS — M6281 Muscle weakness (generalized): Secondary | ICD-10-CM | POA: Diagnosis not present

## 2022-12-02 DIAGNOSIS — R279 Unspecified lack of coordination: Secondary | ICD-10-CM | POA: Diagnosis not present

## 2022-12-03 DIAGNOSIS — M6281 Muscle weakness (generalized): Secondary | ICD-10-CM | POA: Diagnosis not present

## 2022-12-03 DIAGNOSIS — R279 Unspecified lack of coordination: Secondary | ICD-10-CM | POA: Diagnosis not present

## 2022-12-03 DIAGNOSIS — I6330 Cerebral infarction due to thrombosis of unspecified cerebral artery: Secondary | ICD-10-CM | POA: Diagnosis not present

## 2022-12-05 DIAGNOSIS — R279 Unspecified lack of coordination: Secondary | ICD-10-CM | POA: Diagnosis not present

## 2022-12-05 DIAGNOSIS — I6330 Cerebral infarction due to thrombosis of unspecified cerebral artery: Secondary | ICD-10-CM | POA: Diagnosis not present

## 2022-12-05 DIAGNOSIS — M6281 Muscle weakness (generalized): Secondary | ICD-10-CM | POA: Diagnosis not present

## 2022-12-07 DIAGNOSIS — M6281 Muscle weakness (generalized): Secondary | ICD-10-CM | POA: Diagnosis not present

## 2022-12-07 DIAGNOSIS — R279 Unspecified lack of coordination: Secondary | ICD-10-CM | POA: Diagnosis not present

## 2022-12-07 DIAGNOSIS — I6330 Cerebral infarction due to thrombosis of unspecified cerebral artery: Secondary | ICD-10-CM | POA: Diagnosis not present

## 2022-12-08 DIAGNOSIS — E119 Type 2 diabetes mellitus without complications: Secondary | ICD-10-CM | POA: Diagnosis not present

## 2022-12-08 DIAGNOSIS — R5381 Other malaise: Secondary | ICD-10-CM | POA: Diagnosis not present

## 2022-12-08 DIAGNOSIS — I10 Essential (primary) hypertension: Secondary | ICD-10-CM | POA: Diagnosis not present

## 2022-12-08 DIAGNOSIS — I6330 Cerebral infarction due to thrombosis of unspecified cerebral artery: Secondary | ICD-10-CM | POA: Diagnosis not present

## 2022-12-08 DIAGNOSIS — M4800 Spinal stenosis, site unspecified: Secondary | ICD-10-CM | POA: Diagnosis not present

## 2022-12-08 DIAGNOSIS — R279 Unspecified lack of coordination: Secondary | ICD-10-CM | POA: Diagnosis not present

## 2022-12-08 DIAGNOSIS — M6281 Muscle weakness (generalized): Secondary | ICD-10-CM | POA: Diagnosis not present

## 2022-12-09 DIAGNOSIS — M6281 Muscle weakness (generalized): Secondary | ICD-10-CM | POA: Diagnosis not present

## 2022-12-09 DIAGNOSIS — R279 Unspecified lack of coordination: Secondary | ICD-10-CM | POA: Diagnosis not present

## 2022-12-09 DIAGNOSIS — I6330 Cerebral infarction due to thrombosis of unspecified cerebral artery: Secondary | ICD-10-CM | POA: Diagnosis not present

## 2022-12-12 DIAGNOSIS — M6281 Muscle weakness (generalized): Secondary | ICD-10-CM | POA: Diagnosis not present

## 2022-12-12 DIAGNOSIS — R279 Unspecified lack of coordination: Secondary | ICD-10-CM | POA: Diagnosis not present

## 2022-12-12 DIAGNOSIS — I6330 Cerebral infarction due to thrombosis of unspecified cerebral artery: Secondary | ICD-10-CM | POA: Diagnosis not present

## 2022-12-13 DIAGNOSIS — E119 Type 2 diabetes mellitus without complications: Secondary | ICD-10-CM | POA: Diagnosis not present

## 2022-12-13 DIAGNOSIS — M6281 Muscle weakness (generalized): Secondary | ICD-10-CM | POA: Diagnosis not present

## 2022-12-13 DIAGNOSIS — Z91199 Patient's noncompliance with other medical treatment and regimen due to unspecified reason: Secondary | ICD-10-CM | POA: Diagnosis not present

## 2022-12-13 DIAGNOSIS — R279 Unspecified lack of coordination: Secondary | ICD-10-CM | POA: Diagnosis not present

## 2022-12-13 DIAGNOSIS — I6330 Cerebral infarction due to thrombosis of unspecified cerebral artery: Secondary | ICD-10-CM | POA: Diagnosis not present

## 2022-12-14 DIAGNOSIS — R279 Unspecified lack of coordination: Secondary | ICD-10-CM | POA: Diagnosis not present

## 2022-12-14 DIAGNOSIS — M6281 Muscle weakness (generalized): Secondary | ICD-10-CM | POA: Diagnosis not present

## 2022-12-14 DIAGNOSIS — I6330 Cerebral infarction due to thrombosis of unspecified cerebral artery: Secondary | ICD-10-CM | POA: Diagnosis not present

## 2022-12-14 DIAGNOSIS — E119 Type 2 diabetes mellitus without complications: Secondary | ICD-10-CM | POA: Diagnosis not present

## 2022-12-15 DIAGNOSIS — M6281 Muscle weakness (generalized): Secondary | ICD-10-CM | POA: Diagnosis not present

## 2022-12-15 DIAGNOSIS — R279 Unspecified lack of coordination: Secondary | ICD-10-CM | POA: Diagnosis not present

## 2022-12-15 DIAGNOSIS — I6330 Cerebral infarction due to thrombosis of unspecified cerebral artery: Secondary | ICD-10-CM | POA: Diagnosis not present

## 2022-12-17 DIAGNOSIS — R279 Unspecified lack of coordination: Secondary | ICD-10-CM | POA: Diagnosis not present

## 2022-12-17 DIAGNOSIS — M6281 Muscle weakness (generalized): Secondary | ICD-10-CM | POA: Diagnosis not present

## 2022-12-17 DIAGNOSIS — I6330 Cerebral infarction due to thrombosis of unspecified cerebral artery: Secondary | ICD-10-CM | POA: Diagnosis not present

## 2022-12-19 DIAGNOSIS — I6330 Cerebral infarction due to thrombosis of unspecified cerebral artery: Secondary | ICD-10-CM | POA: Diagnosis not present

## 2022-12-19 DIAGNOSIS — M6281 Muscle weakness (generalized): Secondary | ICD-10-CM | POA: Diagnosis not present

## 2022-12-19 DIAGNOSIS — R279 Unspecified lack of coordination: Secondary | ICD-10-CM | POA: Diagnosis not present

## 2022-12-20 DIAGNOSIS — R279 Unspecified lack of coordination: Secondary | ICD-10-CM | POA: Diagnosis not present

## 2022-12-20 DIAGNOSIS — M6281 Muscle weakness (generalized): Secondary | ICD-10-CM | POA: Diagnosis not present

## 2022-12-20 DIAGNOSIS — E11649 Type 2 diabetes mellitus with hypoglycemia without coma: Secondary | ICD-10-CM | POA: Diagnosis not present

## 2022-12-20 DIAGNOSIS — I6330 Cerebral infarction due to thrombosis of unspecified cerebral artery: Secondary | ICD-10-CM | POA: Diagnosis not present

## 2022-12-20 DIAGNOSIS — I10 Essential (primary) hypertension: Secondary | ICD-10-CM | POA: Diagnosis not present

## 2022-12-20 DIAGNOSIS — E119 Type 2 diabetes mellitus without complications: Secondary | ICD-10-CM | POA: Diagnosis not present

## 2022-12-21 DIAGNOSIS — M6281 Muscle weakness (generalized): Secondary | ICD-10-CM | POA: Diagnosis not present

## 2022-12-21 DIAGNOSIS — R279 Unspecified lack of coordination: Secondary | ICD-10-CM | POA: Diagnosis not present

## 2022-12-21 DIAGNOSIS — I6330 Cerebral infarction due to thrombosis of unspecified cerebral artery: Secondary | ICD-10-CM | POA: Diagnosis not present

## 2022-12-22 DIAGNOSIS — R279 Unspecified lack of coordination: Secondary | ICD-10-CM | POA: Diagnosis not present

## 2022-12-22 DIAGNOSIS — I6330 Cerebral infarction due to thrombosis of unspecified cerebral artery: Secondary | ICD-10-CM | POA: Diagnosis not present

## 2022-12-22 DIAGNOSIS — M6281 Muscle weakness (generalized): Secondary | ICD-10-CM | POA: Diagnosis not present

## 2022-12-23 DIAGNOSIS — M6281 Muscle weakness (generalized): Secondary | ICD-10-CM | POA: Diagnosis not present

## 2022-12-23 DIAGNOSIS — R279 Unspecified lack of coordination: Secondary | ICD-10-CM | POA: Diagnosis not present

## 2022-12-23 DIAGNOSIS — I6330 Cerebral infarction due to thrombosis of unspecified cerebral artery: Secondary | ICD-10-CM | POA: Diagnosis not present

## 2022-12-26 DIAGNOSIS — M6281 Muscle weakness (generalized): Secondary | ICD-10-CM | POA: Diagnosis not present

## 2022-12-26 DIAGNOSIS — R279 Unspecified lack of coordination: Secondary | ICD-10-CM | POA: Diagnosis not present

## 2022-12-26 DIAGNOSIS — I6330 Cerebral infarction due to thrombosis of unspecified cerebral artery: Secondary | ICD-10-CM | POA: Diagnosis not present

## 2022-12-27 DIAGNOSIS — E785 Hyperlipidemia, unspecified: Secondary | ICD-10-CM | POA: Diagnosis not present

## 2022-12-27 DIAGNOSIS — R279 Unspecified lack of coordination: Secondary | ICD-10-CM | POA: Diagnosis not present

## 2022-12-27 DIAGNOSIS — I6330 Cerebral infarction due to thrombosis of unspecified cerebral artery: Secondary | ICD-10-CM | POA: Diagnosis not present

## 2022-12-27 DIAGNOSIS — E119 Type 2 diabetes mellitus without complications: Secondary | ICD-10-CM | POA: Diagnosis not present

## 2022-12-27 DIAGNOSIS — M6281 Muscle weakness (generalized): Secondary | ICD-10-CM | POA: Diagnosis not present

## 2022-12-27 DIAGNOSIS — I10 Essential (primary) hypertension: Secondary | ICD-10-CM | POA: Diagnosis not present

## 2022-12-28 DIAGNOSIS — I6330 Cerebral infarction due to thrombosis of unspecified cerebral artery: Secondary | ICD-10-CM | POA: Diagnosis not present

## 2022-12-28 DIAGNOSIS — R279 Unspecified lack of coordination: Secondary | ICD-10-CM | POA: Diagnosis not present

## 2022-12-28 DIAGNOSIS — M6281 Muscle weakness (generalized): Secondary | ICD-10-CM | POA: Diagnosis not present

## 2022-12-29 DIAGNOSIS — I6330 Cerebral infarction due to thrombosis of unspecified cerebral artery: Secondary | ICD-10-CM | POA: Diagnosis not present

## 2022-12-29 DIAGNOSIS — M6281 Muscle weakness (generalized): Secondary | ICD-10-CM | POA: Diagnosis not present

## 2022-12-29 DIAGNOSIS — R279 Unspecified lack of coordination: Secondary | ICD-10-CM | POA: Diagnosis not present

## 2023-01-17 DIAGNOSIS — I6330 Cerebral infarction due to thrombosis of unspecified cerebral artery: Secondary | ICD-10-CM | POA: Diagnosis not present

## 2023-01-17 DIAGNOSIS — E11649 Type 2 diabetes mellitus with hypoglycemia without coma: Secondary | ICD-10-CM | POA: Diagnosis not present

## 2023-01-17 DIAGNOSIS — I10 Essential (primary) hypertension: Secondary | ICD-10-CM | POA: Diagnosis not present

## 2023-01-31 DIAGNOSIS — U071 COVID-19: Secondary | ICD-10-CM | POA: Diagnosis not present

## 2023-02-01 DIAGNOSIS — U071 COVID-19: Secondary | ICD-10-CM | POA: Diagnosis not present

## 2023-02-01 DIAGNOSIS — I10 Essential (primary) hypertension: Secondary | ICD-10-CM | POA: Diagnosis not present

## 2023-02-08 DIAGNOSIS — I10 Essential (primary) hypertension: Secondary | ICD-10-CM | POA: Diagnosis not present

## 2023-02-17 DIAGNOSIS — H579 Unspecified disorder of eye and adnexa: Secondary | ICD-10-CM | POA: Diagnosis not present

## 2023-02-17 DIAGNOSIS — I10 Essential (primary) hypertension: Secondary | ICD-10-CM | POA: Diagnosis not present

## 2023-02-17 DIAGNOSIS — Z91199 Patient's noncompliance with other medical treatment and regimen due to unspecified reason: Secondary | ICD-10-CM | POA: Diagnosis not present

## 2023-03-02 DIAGNOSIS — M1990 Unspecified osteoarthritis, unspecified site: Secondary | ICD-10-CM | POA: Diagnosis not present

## 2023-03-02 DIAGNOSIS — I10 Essential (primary) hypertension: Secondary | ICD-10-CM | POA: Diagnosis not present

## 2023-03-02 DIAGNOSIS — R5381 Other malaise: Secondary | ICD-10-CM | POA: Diagnosis not present

## 2023-03-02 DIAGNOSIS — I2510 Atherosclerotic heart disease of native coronary artery without angina pectoris: Secondary | ICD-10-CM | POA: Diagnosis not present

## 2023-03-06 DIAGNOSIS — L603 Nail dystrophy: Secondary | ICD-10-CM | POA: Diagnosis not present

## 2023-03-06 DIAGNOSIS — E1149 Type 2 diabetes mellitus with other diabetic neurological complication: Secondary | ICD-10-CM | POA: Diagnosis not present

## 2023-03-22 DIAGNOSIS — E119 Type 2 diabetes mellitus without complications: Secondary | ICD-10-CM | POA: Diagnosis not present

## 2023-03-22 DIAGNOSIS — Z91199 Patient's noncompliance with other medical treatment and regimen due to unspecified reason: Secondary | ICD-10-CM | POA: Diagnosis not present

## 2023-03-22 DIAGNOSIS — H579 Unspecified disorder of eye and adnexa: Secondary | ICD-10-CM | POA: Diagnosis not present

## 2023-03-24 DIAGNOSIS — I10 Essential (primary) hypertension: Secondary | ICD-10-CM | POA: Diagnosis not present

## 2023-03-24 DIAGNOSIS — E11649 Type 2 diabetes mellitus with hypoglycemia without coma: Secondary | ICD-10-CM | POA: Diagnosis not present

## 2024-10-21 ENCOUNTER — Other Ambulatory Visit: Payer: Self-pay | Admitting: Nurse Practitioner

## 2024-10-21 DIAGNOSIS — Z1231 Encounter for screening mammogram for malignant neoplasm of breast: Secondary | ICD-10-CM

## 2024-11-07 ENCOUNTER — Ambulatory Visit
Admission: RE | Admit: 2024-11-07 | Discharge: 2024-11-07 | Disposition: A | Source: Ambulatory Visit | Attending: Nurse Practitioner | Admitting: Nurse Practitioner

## 2024-11-07 ENCOUNTER — Other Ambulatory Visit: Payer: Self-pay | Admitting: Nurse Practitioner

## 2024-11-07 DIAGNOSIS — Z1231 Encounter for screening mammogram for malignant neoplasm of breast: Secondary | ICD-10-CM

## 2024-11-19 ENCOUNTER — Other Ambulatory Visit: Payer: Self-pay | Admitting: Nurse Practitioner

## 2024-11-19 DIAGNOSIS — R928 Other abnormal and inconclusive findings on diagnostic imaging of breast: Secondary | ICD-10-CM

## 2024-11-25 ENCOUNTER — Other Ambulatory Visit

## 2024-11-25 ENCOUNTER — Encounter

## 2024-12-05 ENCOUNTER — Other Ambulatory Visit

## 2024-12-05 ENCOUNTER — Encounter
